# Patient Record
Sex: Female | Born: 1955
Health system: Southern US, Community
[De-identification: ages and names within clinical notes are randomized; demographics above are authoritative.]

## PROBLEM LIST (undated history)

## (undated) DIAGNOSIS — K5790 Diverticulosis of intestine, part unspecified, without perforation or abscess without bleeding: Secondary | ICD-10-CM

## (undated) DIAGNOSIS — M1990 Unspecified osteoarthritis, unspecified site: Secondary | ICD-10-CM

## (undated) DIAGNOSIS — T7840XA Allergy, unspecified, initial encounter: Secondary | ICD-10-CM

## (undated) DIAGNOSIS — Z86010 Personal history of colonic polyps: Secondary | ICD-10-CM

## (undated) DIAGNOSIS — G43909 Migraine, unspecified, not intractable, without status migrainosus: Secondary | ICD-10-CM

## (undated) HISTORY — PX: WISDOM TOOTH EXTRACTION: SHX21

## (undated) HISTORY — DX: Diverticulosis of intestine, part unspecified, without perforation or abscess without bleeding: K57.90

## (undated) HISTORY — DX: Allergy, unspecified, initial encounter: T78.40XA

## (undated) HISTORY — DX: Unspecified osteoarthritis, unspecified site: M19.90

## (undated) HISTORY — DX: Migraine, unspecified, not intractable, without status migrainosus: G43.909

---
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
## 1998-02-15 ENCOUNTER — Encounter: Payer: Self-pay | Admitting: Internal Medicine

## 2006-07-02 ENCOUNTER — Other Ambulatory Visit: Admission: RE | Admit: 2006-07-02 | Discharge: 2006-07-02 | Payer: Self-pay | Admitting: Internal Medicine

## 2006-07-02 ENCOUNTER — Encounter: Payer: Self-pay | Admitting: Internal Medicine

## 2006-07-02 ENCOUNTER — Ambulatory Visit: Payer: Self-pay | Admitting: Internal Medicine

## 2006-07-02 LAB — CONVERTED CEMR LAB
Albumin: 3.6 g/dL (ref 3.5–5.2)
Basophils Absolute: 0.1 10*3/uL (ref 0.0–0.1)
Creatinine, Ser: 0.6 mg/dL (ref 0.4–1.2)
HCT: 38.2 % (ref 36.0–46.0)
Hemoglobin: 12.9 g/dL (ref 12.0–15.0)
MCHC: 33.8 g/dL (ref 30.0–36.0)
MCV: 82.2 fL (ref 78.0–100.0)
Monocytes Absolute: 0.4 10*3/uL (ref 0.2–0.7)
Neutrophils Relative %: 52 % (ref 43.0–77.0)
Pap Smear: NORMAL
Potassium: 4.5 meq/L (ref 3.5–5.1)
RDW: 14.3 % (ref 11.5–14.6)
Sodium: 141 meq/L (ref 135–145)
TSH: 1.54 microintl units/mL (ref 0.35–5.50)
Total Bilirubin: 0.8 mg/dL (ref 0.3–1.2)
Total Protein: 7.2 g/dL (ref 6.0–8.3)

## 2006-07-16 LAB — HM MAMMOGRAPHY: HM Mammogram: NORMAL

## 2006-11-25 ENCOUNTER — Ambulatory Visit: Payer: Self-pay | Admitting: Internal Medicine

## 2006-11-25 DIAGNOSIS — L255 Unspecified contact dermatitis due to plants, except food: Secondary | ICD-10-CM

## 2007-10-18 ENCOUNTER — Telehealth: Payer: Self-pay | Admitting: Family Medicine

## 2007-10-19 ENCOUNTER — Ambulatory Visit: Payer: Self-pay | Admitting: Internal Medicine

## 2007-10-19 DIAGNOSIS — IMO0002 Reserved for concepts with insufficient information to code with codable children: Secondary | ICD-10-CM | POA: Insufficient documentation

## 2008-01-13 ENCOUNTER — Encounter: Payer: Self-pay | Admitting: Internal Medicine

## 2008-01-13 DIAGNOSIS — N943 Premenstrual tension syndrome: Secondary | ICD-10-CM | POA: Insufficient documentation

## 2008-01-14 ENCOUNTER — Ambulatory Visit: Payer: Self-pay | Admitting: Internal Medicine

## 2008-01-14 DIAGNOSIS — J309 Allergic rhinitis, unspecified: Secondary | ICD-10-CM

## 2008-01-15 ENCOUNTER — Encounter: Payer: Self-pay | Admitting: Internal Medicine

## 2008-02-03 ENCOUNTER — Encounter: Payer: Self-pay | Admitting: Internal Medicine

## 2008-02-10 ENCOUNTER — Encounter (INDEPENDENT_AMBULATORY_CARE_PROVIDER_SITE_OTHER): Payer: Self-pay | Admitting: *Deleted

## 2008-05-06 HISTORY — PX: COLONOSCOPY: SHX174

## 2008-06-18 ENCOUNTER — Telehealth: Payer: Self-pay | Admitting: Family Medicine

## 2008-06-20 ENCOUNTER — Ambulatory Visit: Payer: Self-pay | Admitting: Family Medicine

## 2008-06-20 DIAGNOSIS — N390 Urinary tract infection, site not specified: Secondary | ICD-10-CM

## 2008-06-20 LAB — CONVERTED CEMR LAB
Ketones, urine, test strip: NEGATIVE
Nitrite: NEGATIVE
pH: 5

## 2008-06-23 ENCOUNTER — Ambulatory Visit: Payer: Self-pay | Admitting: Internal Medicine

## 2008-06-23 ENCOUNTER — Encounter: Payer: Self-pay | Admitting: Internal Medicine

## 2008-06-23 DIAGNOSIS — N939 Abnormal uterine and vaginal bleeding, unspecified: Secondary | ICD-10-CM

## 2008-06-23 DIAGNOSIS — N926 Irregular menstruation, unspecified: Secondary | ICD-10-CM | POA: Insufficient documentation

## 2008-06-23 DIAGNOSIS — L989 Disorder of the skin and subcutaneous tissue, unspecified: Secondary | ICD-10-CM | POA: Insufficient documentation

## 2008-06-23 LAB — CONVERTED CEMR LAB
Bilirubin Urine: NEGATIVE
Nitrite: NEGATIVE
Specific Gravity, Urine: <1.005
pH: 7

## 2008-07-01 ENCOUNTER — Ambulatory Visit: Payer: Self-pay | Admitting: Internal Medicine

## 2008-07-15 ENCOUNTER — Ambulatory Visit: Payer: Self-pay | Admitting: Internal Medicine

## 2010-07-16 ENCOUNTER — Ambulatory Visit (INDEPENDENT_AMBULATORY_CARE_PROVIDER_SITE_OTHER): Payer: 59 | Admitting: Internal Medicine

## 2010-07-16 ENCOUNTER — Encounter: Payer: Self-pay | Admitting: Internal Medicine

## 2010-07-16 DIAGNOSIS — N951 Menopausal and female climacteric states: Secondary | ICD-10-CM | POA: Insufficient documentation

## 2010-07-24 NOTE — Assessment & Plan Note (Signed)
Summary: discuss hormone replacement   Vital Signs:  Patient profile:   55 year old female Weight:      219 pounds BMI:     38.32 Temp:     98.4 degrees F oral Pulse rate:   68 / minute Pulse rhythm:   regular BP sitting:   108 / 78  (left arm) Cuff size:   large  Vitals Entered By: Mervin Hack CMA Duncan Dull) (July 16, 2010 4:14 PM) CC: discuss hormone replacement   History of Present Illness: having terrible hot flashes goes back at least 4 years---that long since last period or more can't sleep well SOme mood issues as well doesn't feel depressed but is tired a  lot  No FH of breast cancer or vascular disease Mom still has hormonal symptoms into her 70's  Hasn't tried soy or black cohosh  Allergies: 1)  Keflex (Cephalexin)  Past History:  Past medical, surgical, family and social histories (including risk factors) reviewed for relevance to current acute and chronic problems.  Past Medical History: Reviewed history from 01/14/2008 and no changes required. Migraines--with menses Allergic rhinitis  Past Surgical History: Reviewed history from 01/13/2008 and no changes required. vaginal delivery X 1, C-section X 1  Family History: Reviewed history from 01/13/2008 and no changes required. Father: Died at age 42, MI Mother:Alive, arthritis  1 sister No HTN or DM No breast or colon cancer  Social History: Reviewed history from 01/13/2008 and no changes required. Marital Status: Married Children: 2 Occupation: Quarry manager, [employer] Never Smoked Alcohol use-occ  Review of Systems       Occ headaches---not clearly migrainous weight is up 16# in the last 2 years  Physical Exam  General:  alert and normal appearance.   Psych:  normally interactive, good eye contact, not anxious appearing, and not depressed appearing.     Impression & Recommendations:  Problem # 1:  MENOPAUSAL SYNDROME (ICD-627.2) Assessment New  having life altering  symptoms discussed the risks and benefits of hormonal Rx Counselled over half of 15 minute visit  will start estradiol PE in 3-4 months and will wean then discussed side effects to watch discussed exercise  Her updated medication list for this problem includes:    Estradiol 1 Mg Tabs (Estradiol) .Marland Kitchen... 1 tab daily for menopausal syndrome  Complete Medication List: 1)  Estradiol 1 Mg Tabs (Estradiol) .Marland Kitchen.. 1 tab daily for menopausal syndrome  Patient Instructions: 1)  Please schedule a follow-up appointment in 3-4  months for physical Prescriptions: ESTRADIOL 1 MG TABS (ESTRADIOL) 1 tab daily for menopausal syndrome  #30 x 5   Entered and Authorized by:   Cindee Salt MD   Signed by:   Cindee Salt MD on 07/16/2010   Method used:   Electronically to        [facility] [address] (retail)       [address], [address]       [place], [place]  [address]       Ph: [phone]       Fax: [phone]   RxID:   [id number]    Orders Added: 1)  Est. Patient Level III [84132]    Prior Medications: Current Allergies (reviewed today): KEFLEX (CEPHALEXIN)

## 2010-10-31 ENCOUNTER — Encounter: Payer: Self-pay | Admitting: Internal Medicine

## 2010-11-01 ENCOUNTER — Ambulatory Visit (INDEPENDENT_AMBULATORY_CARE_PROVIDER_SITE_OTHER): Payer: 59 | Admitting: Internal Medicine

## 2010-11-01 ENCOUNTER — Encounter: Payer: Self-pay | Admitting: Internal Medicine

## 2010-11-01 VITALS — BP 118/68 | HR 76 | Temp 98.4°F | Ht 63.0 in | Wt 209.0 lb

## 2010-11-01 DIAGNOSIS — Z0000 Encounter for general adult medical examination without abnormal findings: Secondary | ICD-10-CM

## 2010-11-01 DIAGNOSIS — N951 Menopausal and female climacteric states: Secondary | ICD-10-CM

## 2010-11-01 DIAGNOSIS — Z1231 Encounter for screening mammogram for malignant neoplasm of breast: Secondary | ICD-10-CM

## 2010-11-01 NOTE — Progress Notes (Signed)
Subjective:    Patient ID: Mercedes White, female    DOB: 04/09/1956, 55 y.o.   MRN: [id number]  HPI Happy with the estrogen Night sweats and hot flashes are gone Forgets dose occasionally No migraines  Doesn't see gynecologist Last pap a couple of years ago when she had abnormal bleeding  Has started on-line weight watchers Has lost 7# Mows lawn as only exercise--discussed she needs to do more  Current Outpatient Prescriptions on File Prior to Visit  Medication Sig Dispense Refill  . estradiol (ESTRACE) 1 MG tablet Take 1 mg by mouth daily.          Allergies  Allergen Reactions  . Cephalexin     REACTION: rash    Past Medical History  Diagnosis Date  . Migraines     with menses  . Allergy     Past Surgical History  Procedure Date  . Cesarean section     x1  . Vaginal delivery     x1    Family History  Problem Relation Age of Onset  . Arthritis Mother   . Cancer Neg Hx   . Diabetes Neg Hx   . Hypertension Neg Hx     History   Social History  . Marital Status: Married    Spouse Name: N/A    Number of Children: 2  . Years of Education: N/A   Occupational History  . patient billing [employer]   Social History Main Topics  . Smoking status: Never Smoker   . Smokeless tobacco: Never Used  . Alcohol Use: Yes  . Drug Use: No  . Sexually Active: Not on file   Other Topics Concern  . Not on file   Social History Narrative  . No narrative on file   Review of Systems  Constitutional: Negative for fatigue and unexpected weight change.       Wears seat belt  HENT: Positive for hearing loss, congestion and rhinorrhea. Negative for dental problem and tinnitus.        Occ seasonal symptoms---uses OTC meds Regular with dentist  Eyes: Negative for visual disturbance.       No diplopia or focal vision loss  Respiratory: Negative for cough, chest tightness and shortness of breath.   Cardiovascular: Positive for palpitations. Negative for chest pain  and leg swelling.       Still occ palpitations--only at rest EKG benign in past with this symptom  Gastrointestinal: Negative for nausea, vomiting, abdominal pain, constipation and blood in stool.       No heartburn Stool is funny black/green--she relates to all her fruit intake  Genitourinary: Negative for dysuria, difficulty urinating and dyspareunia.       Occ urgency but no incontinence  Musculoskeletal: Positive for arthralgias. Negative for back pain and joint swelling.       Left 3rd finger and right 5th fingers---from past injuries  Skin: Negative for rash.       Right calf red lesion is occ tender--no change  Neurological: Positive for numbness. Negative for dizziness, syncope, weakness and headaches.       Occ arm numbness if hold heavy purse or sitting at computer. Moves and it resolves  Psychiatric/Behavioral: Positive for sleep disturbance. Negative for dysphoric mood. The patient is not nervous/anxious.        Occ sleep problems--not restful sleep but not regularly       Objective:   Physical Exam  Constitutional: She is oriented to person, place, and time.  She appears well-developed and well-nourished. No distress.  HENT:  Head: Normocephalic and atraumatic.  Right Ear: External ear normal.  Left Ear: External ear normal.  Mouth/Throat: Oropharynx is clear and moist. No oropharyngeal exudate.       TMs normal  Eyes: Conjunctivae and EOM are normal. Pupils are equal, round, and reactive to light.       Fundi benign  Neck: Normal range of motion. Neck supple. No thyromegaly present.  Cardiovascular: Normal rate, regular rhythm, normal heart sounds and intact distal pulses.  Exam reveals no gallop.   No murmur heard. Pulmonary/Chest: Effort normal and breath sounds normal. No respiratory distress. She has no wheezes. She has no rales.  Abdominal: Soft. She exhibits no mass. There is no tenderness.  Genitourinary:       Breasts have cystic changes bilaterally without  sig mass Genitalia normal Cervix clearly estrogenized and normal Pap done Limited but benign bimanual exam  Musculoskeletal: Normal range of motion. She exhibits no edema and no tenderness.  Lymphadenopathy:    She has no cervical adenopathy.  Neurological: She is alert and oriented to person, place, and time. She exhibits normal muscle tone.       Normal strength and gait  Skin: No rash noted.       ?dermatofibroma on right calf  Psychiatric: She has a normal mood and affect. Her behavior is normal. Judgment and thought content normal.          Assessment & Plan:

## 2010-11-01 NOTE — Assessment & Plan Note (Signed)
Healthy On weight watchers and has lost some weight Discussed exercise Due for mammo

## 2010-11-01 NOTE — Progress Notes (Signed)
Addended by: Alvina Chou on: 11/01/2010 03:29 PM   Modules accepted: Orders

## 2010-11-01 NOTE — Assessment & Plan Note (Signed)
Better on the estrogen Will try to wean Check labs

## 2010-11-01 NOTE — Patient Instructions (Addendum)
Please set up mammogram Please decrease the estradiol to 5 days per week. In one month, if your symptoms are still controlled, try decreasing it to every other day

## 2010-11-08 ENCOUNTER — Ambulatory Visit: Payer: Self-pay | Admitting: Internal Medicine

## 2010-11-12 ENCOUNTER — Telehealth: Payer: Self-pay | Admitting: *Deleted

## 2010-11-12 NOTE — Telephone Encounter (Signed)
Calling patient to advise about pap results. Per Dr.Letvak, her pap didn't have enough cervix cells for a proper eval but nothing worrisome seen.  Left message for patient to return my call.

## 2010-11-13 ENCOUNTER — Encounter: Payer: Self-pay | Admitting: Internal Medicine

## 2010-11-13 NOTE — Telephone Encounter (Signed)
Spoke with patient and advised results   

## 2010-11-16 ENCOUNTER — Telehealth: Payer: Self-pay | Admitting: *Deleted

## 2010-11-16 DIAGNOSIS — R928 Other abnormal and inconclusive findings on diagnostic imaging of breast: Secondary | ICD-10-CM

## 2010-11-16 NOTE — Telephone Encounter (Signed)
Patient called back and her additional imaging has not been scheduled.

## 2010-11-16 NOTE — Telephone Encounter (Signed)
Left message on cell and home numbers asking patient to return my call. Calling patient to see if her additional studies for her mammogram have been scheduled? They need additional work-up of the left breast. Per Dr.Letvak it's noting overly worrisome just a suspicious area needs to be re-examined.

## 2010-11-20 ENCOUNTER — Encounter: Payer: Self-pay | Admitting: Internal Medicine

## 2010-11-20 NOTE — Telephone Encounter (Signed)
Addl views appt made with patient on 11/26/2010 at [facility], pt notified.

## 2010-11-26 ENCOUNTER — Telehealth: Payer: Self-pay | Admitting: *Deleted

## 2010-11-26 ENCOUNTER — Ambulatory Visit: Payer: Self-pay | Admitting: Internal Medicine

## 2010-11-26 NOTE — Telephone Encounter (Signed)
Called patient and left message to have her return my call about her mammogram results. See scanned resport, sent to be scanned on 11/26/10   Per Dr.Letvak:  the repeat mammo and ultrasound show just a cyst. This is very reassuring, they would like to recheck in to be sure it is stable.

## 2010-11-27 NOTE — Telephone Encounter (Signed)
.  left message at work and cell to have patient return my call.

## 2010-11-28 ENCOUNTER — Encounter: Payer: Self-pay | Admitting: Internal Medicine

## 2010-11-29 NOTE — Telephone Encounter (Signed)
Spoke with patient and advised results, per patient she already knew the results and know that she needs a repeat in around December.

## 2011-01-01 ENCOUNTER — Encounter: Payer: Self-pay | Admitting: Internal Medicine

## 2011-01-01 ENCOUNTER — Ambulatory Visit (INDEPENDENT_AMBULATORY_CARE_PROVIDER_SITE_OTHER): Payer: 59 | Admitting: Internal Medicine

## 2011-01-01 DIAGNOSIS — M79609 Pain in unspecified limb: Secondary | ICD-10-CM

## 2011-01-01 DIAGNOSIS — M79602 Pain in left arm: Secondary | ICD-10-CM

## 2011-01-01 NOTE — Progress Notes (Signed)
  Subjective:    Patient ID: Mercedes White, female    DOB: 1955/06/06, 55 y.o.   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 estradiol (ESTRACE) 1 MG tablet Take 1 mg by mouth daily.          Allergies  Allergen Reactions  . Cephalexin     REACTION: rash    Past Medical History  Diagnosis Date  . Migraines     with menses  . Allergy     Past Surgical History  Procedure Date  . Cesarean section     x1  . Vaginal delivery     x1    Family History  Problem Relation Age of Onset  . Arthritis Mother   . Cancer Neg Hx   . Diabetes Neg Hx   . Hypertension Neg Hx     History   Social History  . Marital Status: Married    Spouse Name: N/A    Number of Children: 2  . Years of Education: N/A   Occupational History  . patient billing [employer]   Social History Main Topics  . Smoking status: Never Smoker   . Smokeless tobacco: Never Used  . Alcohol Use: Yes  . Drug Use: No  . Sexually Active: Not on file   Other Topics Concern  . Not on file   Social History Narrative  . No narrative on file   Review of Systems Grip strength is okay No leg problems except some puffiness in right knee since fall     Objective:   Physical Exam  Constitutional: She appears well-developed and well-nourished. No distress.  Musculoskeletal:       Normal ROM in left shoulder--very slight crepitus Slight tenderness along axilla and pectoral muscle at shoulder  Lymphadenopathy:    She has no axillary adenopathy.    Neurological:       Normal strength          Assessment & Plan:

## 2011-01-01 NOTE — Assessment & Plan Note (Signed)
Clearly seems to be related to the fall Muscle injury only No masses or joint findings of concern Reassured--discussed symptom care

## 2011-04-02 ENCOUNTER — Telehealth: Payer: Self-pay | Admitting: Internal Medicine

## 2011-04-02 DIAGNOSIS — R928 Other abnormal and inconclusive findings on diagnostic imaging of breast: Secondary | ICD-10-CM

## 2011-04-02 NOTE — Telephone Encounter (Signed)
[name],  There was a prior order for this entered in July Please find this and cancel it

## 2011-04-02 NOTE — Telephone Encounter (Signed)
Mercedes White from [facility] called to ask you to please put an order in for the 6 month FU Diagnostic Uni Left MMG , DX is nodularity. They have scheduled her an appt on 05/23/2011 at 10:00am. Please sign and fax your order to [phone]. I will call the patient to give her this  Date and time.

## 2011-06-04 ENCOUNTER — Ambulatory Visit: Payer: Self-pay | Admitting: Internal Medicine

## 2012-04-08 ENCOUNTER — Other Ambulatory Visit: Payer: Self-pay | Admitting: Internal Medicine

## 2013-04-05 ENCOUNTER — Ambulatory Visit (INDEPENDENT_AMBULATORY_CARE_PROVIDER_SITE_OTHER): Payer: BC Managed Care – PPO | Admitting: Internal Medicine

## 2013-04-05 ENCOUNTER — Encounter: Payer: Self-pay | Admitting: Internal Medicine

## 2013-04-05 VITALS — BP 110/70 | HR 58 | Temp 97.7°F | Wt 196.0 lb

## 2013-04-05 DIAGNOSIS — R05 Cough: Secondary | ICD-10-CM

## 2013-04-05 MED ORDER — PREDNISONE 20 MG PO TABS: 40.0000 mg | ORAL_TABLET | Freq: Every day | ORAL | Status: DC

## 2013-04-05 MED ORDER — AMOXICILLIN 500 MG PO TABS: 1000.0000 mg | ORAL_TABLET | Freq: Two times a day (BID) | ORAL | Status: DC

## 2013-04-05 NOTE — Progress Notes (Signed)
   Subjective:    Patient ID: Mercedes White, female    DOB: 02-19-1956, 57 y.o.   MRN: [id number]  HPI Had a bad cold in October Missed a day of work Then got another cold last month---- not the cough is persistent  Doesn't feel sick Cough worst in AM---lots of phlegm and nasal drainage Notes wheezing if she is laughing--not any worse No history of asthma  No fever Gets a feeling of shortness of breath if she takes a deep breath Still has energy---not limited in activity Has hot flashes at night--no change  Right ear has some crust in canal--no pain No sore throat  Has used mucinex and tylenol cold Nothing recently  Current Outpatient Prescriptions on File Prior to Visit  Medication Sig Dispense Refill  . b complex vitamins capsule Take 1 capsule by mouth daily.        . Multiple Vitamin (MULTIVITAMIN) tablet Take 1 tablet by mouth daily.         No current facility-administered medications on file prior to visit.    Allergies  Allergen Reactions  . Cephalexin     REACTION: rash    Past Medical History  Diagnosis Date  . Migraines     with menses  . Allergy     Past Surgical History  Procedure Laterality Date  . Cesarean section      x1  . Vaginal delivery      x1    Family History  Problem Relation Age of Onset  . Arthritis Mother   . Cancer Neg Hx   . Diabetes Neg Hx   . Hypertension Neg Hx     History   Social History  . Marital Status: Married    Spouse Name: N/A    Number of Children: 2  . Years of Education: N/A   Occupational History  . patient billing [employer]   Social History Main Topics  . Smoking status: Never Smoker   . Smokeless tobacco: Never Used  . Alcohol Use: Yes  . Drug Use: No  . Sexual Activity: Not on file   Other Topics Concern  . Not on file   Social History Narrative  . No narrative on file   Review of Systems No rash Appetite is fine No vomiting or diarrhea     Objective:   Physical Exam    Constitutional: She appears well-developed and well-nourished. No distress.  HENT:  Mouth/Throat: Oropharynx is clear and moist. No oropharyngeal exudate.  No sinus tenderness Moderate nasal congestion TMs normal  Neck: Normal range of motion. Neck supple.  Pulmonary/Chest: Effort normal and breath sounds normal. No respiratory distress. She has no wheezes. She has no rales.  Lymphadenopathy:    She has no cervical adenopathy.          Assessment & Plan:

## 2013-04-05 NOTE — Assessment & Plan Note (Signed)
Persistent upper respiratory infection Spirometry was normal---though could still have cough variant asthma (and she wheezes at times) Will Rx with amoxicillin and 5 days of prednisone

## 2013-04-05 NOTE — Progress Notes (Signed)
Pre-visit discussion using our clinic review tool. No additional management support is needed unless otherwise documented below in the visit note.  

## 2013-04-15 ENCOUNTER — Telehealth: Payer: Self-pay | Admitting: Internal Medicine

## 2013-04-15 NOTE — Telephone Encounter (Signed)
Pt left voicemail with triage. She was seen 04/05/13 for cough and was prescribed abx and prednisone. Pt has finished both medications and she is still coughing bad, especially at night. Pt wanted to know is there something OTC she can take or if there is something else you can recommend her to do for the cough

## 2013-04-15 NOTE — Telephone Encounter (Signed)
Find out if the prednisone helped the cough when she took it She can use dextromethorphan OTC for cough otherwise reeval if SOB

## 2013-04-15 NOTE — Telephone Encounter (Signed)
Spoke with patient and advised results   

## 2013-04-19 NOTE — Telephone Encounter (Signed)
Pts cough is better with Delsym but still taking coughing spells. No fever,non prod cough, or CP. No SOB noted but thinks if exercised pt would have SOB.[facility] [address]. Pt will call back if condition changes or worsens. Pt scheduled 1st available with Dr Alphonsus Sias on 04/21/13 at 11:15 am.

## 2013-04-19 NOTE — Telephone Encounter (Signed)
If she thinks she is improving, even slowly, I would just recommend continuing the delsym. If she worsens, she should come back for a reevaluation  It is not unusual to have a residual cough for several weeks after a bad chest infection---it will just slowly fade away.

## 2013-04-19 NOTE — Telephone Encounter (Signed)
.  left message to have patient return my call.  

## 2013-04-20 NOTE — Telephone Encounter (Signed)
Spoke with patient and she will wait it out and call back if the coughs get worse

## 2013-04-20 NOTE — Telephone Encounter (Signed)
.  left message to have patient return my call.  

## 2013-04-21 ENCOUNTER — Ambulatory Visit: Payer: BC Managed Care – PPO | Admitting: Internal Medicine

## 2013-11-01 ENCOUNTER — Ambulatory Visit (INDEPENDENT_AMBULATORY_CARE_PROVIDER_SITE_OTHER): Payer: BC Managed Care – PPO | Admitting: Internal Medicine

## 2013-11-01 ENCOUNTER — Encounter: Payer: Self-pay | Admitting: Internal Medicine

## 2013-11-01 VITALS — BP 118/78 | HR 70 | Temp 98.3°F | Ht 63.0 in | Wt 202.0 lb

## 2013-11-01 DIAGNOSIS — Z23 Encounter for immunization: Secondary | ICD-10-CM

## 2013-11-01 DIAGNOSIS — E669 Obesity, unspecified: Secondary | ICD-10-CM

## 2013-11-01 DIAGNOSIS — Z0000 Encounter for general adult medical examination without abnormal findings: Secondary | ICD-10-CM

## 2013-11-01 NOTE — Progress Notes (Signed)
Pre visit review using our clinic review tool, if applicable. No additional management support is needed unless otherwise documented below in the visit note. 

## 2013-11-01 NOTE — Assessment & Plan Note (Signed)
Lifestyle info given Will check labs

## 2013-11-01 NOTE — Patient Instructions (Addendum)
Please set up your screening mammogram.  Exercise to Lose Weight Exercise and a healthy diet may help you lose weight. Your doctor may suggest specific exercises. EXERCISE IDEAS AND TIPS  Choose low-cost things you enjoy doing, such as walking, bicycling, or exercising to workout videos.  Take stairs instead of the elevator.  Walk during your lunch break.  Park your car further away from work or school.  Go to a gym or an exercise class.  Start with 5 to 10 minutes of exercise each day. Build up to 30 minutes of exercise 4 to 6 days a week.  Wear shoes with good support and comfortable clothes.  Stretch before and after working out.  Work out until you breathe harder and your heart beats faster.  Drink extra water when you exercise.  Do not do so much that you hurt yourself, feel dizzy, or get very short of breath. Exercises that burn about 150 calories:  Running 1  miles in 15 minutes.  Playing volleyball for 45 to 60 minutes.  Washing and waxing a car for 45 to 60 minutes.  Playing touch football for 45 minutes.  Walking 1  miles in 35 minutes.  Pushing a stroller 1  miles in 30 minutes.  Playing basketball for 30 minutes.  Raking leaves for 30 minutes.  Bicycling 5 miles in 30 minutes.  Walking 2 miles in 30 minutes.  Dancing for 30 minutes.  Shoveling snow for 15 minutes.  Swimming laps for 20 minutes.  Walking up stairs for 15 minutes.  Bicycling 4 miles in 15 minutes.  Gardening for 30 to 45 minutes.  Jumping rope for 15 minutes.  Washing windows or floors for 45 to 60 minutes. Document Released: 05/25/2010 Document Revised: 07/15/2011 Document Reviewed: 05/25/2010 [facility] Patient Information 2015 [place], [place]. This information is not intended to replace advice given to you by your health care provider. Make sure you discuss any questions you have with your health care provider. DASH Eating Plan DASH stands for "Dietary Approaches to  Stop Hypertension." The DASH eating plan is a healthy eating plan that has been shown to reduce high blood pressure (hypertension). Additional health benefits may include reducing the risk of type 2 diabetes mellitus, heart disease, and stroke. The DASH eating plan may also help with weight loss. WHAT DO I NEED TO KNOW ABOUT THE DASH EATING PLAN? For the DASH eating plan, you will follow these general guidelines:  Choose foods with a percent daily value for sodium of less than 5% (as listed on the food label).  Use salt-free seasonings or herbs instead of table salt or sea salt.  Check with your health care provider or pharmacist before using salt substitutes.  Eat lower-sodium products, often labeled as "lower sodium" or "no salt added."  Eat fresh foods.  Eat more vegetables, fruits, and low-fat dairy products.  Choose whole grains. Look for the word "whole" as the first word in the ingredient list.  Choose fish and skinless chicken or [place] more often than red meat. Limit fish, poultry, and meat to 6 oz (170 g) each day.  Limit sweets, desserts, sugars, and sugary drinks.  Choose heart-healthy fats.  Limit cheese to 1 oz (28 g) per day.  Eat more home-cooked food and less restaurant, buffet, and fast food.  Limit fried foods.  Cook foods using methods other than frying.  Limit canned vegetables. If you do use them, rinse them well to decrease the sodium.  When eating at a restaurant,  ask that your food be prepared with less salt, or no salt if possible. WHAT FOODS CAN I EAT? Seek help from a dietitian for individual calorie needs. Grains Whole grain or whole wheat bread. Brown rice. Whole grain or whole wheat pasta. Quinoa, bulgur, and whole grain cereals. Low-sodium cereals. Corn or whole wheat flour tortillas. Whole grain cornbread. Whole grain crackers. Low-sodium crackers. Vegetables Fresh or frozen vegetables (raw, steamed, roasted, or grilled). Low-sodium or  reduced-sodium tomato and vegetable juices. Low-sodium or reduced-sodium tomato sauce and paste. Low-sodium or reduced-sodium canned vegetables.  Fruits All fresh, canned (in natural juice), or frozen fruits. Meat and Other Protein Products Ground beef (85% or leaner), grass-fed beef, or beef trimmed of fat. Skinless chicken or [place]. Ground chicken or [place]. Pork trimmed of fat. All fish and seafood. Eggs. Dried beans, peas, or lentils. Unsalted nuts and seeds. Unsalted canned beans. Dairy Low-fat dairy products, such as skim or 1% milk, 2% or reduced-fat cheeses, low-fat ricotta or cottage cheese, or plain low-fat yogurt. Low-sodium or reduced-sodium cheeses. Fats and Oils Tub margarines without trans fats. Light or reduced-fat mayonnaise and salad dressings (reduced sodium). Avocado. Safflower, olive, or canola oils. Natural peanut or almond butter. Other Unsalted popcorn and pretzels. The items listed above may not be a complete list of recommended foods or beverages. Contact your dietitian for more options. WHAT FOODS ARE NOT RECOMMENDED? Grains White bread. White pasta. White rice. Refined cornbread. Bagels and croissants. Crackers that contain trans fat. Vegetables Creamed or fried vegetables. Vegetables in a cheese sauce. Regular canned vegetables. Regular canned tomato sauce and paste. Regular tomato and vegetable juices. Fruits Dried fruits. Canned fruit in light or heavy syrup. Fruit juice. Meat and Other Protein Products Fatty cuts of meat. Ribs, chicken wings, bacon, sausage, bologna, salami, chitterlings, fatback, hot dogs, bratwurst, and packaged luncheon meats. Salted nuts and seeds. Canned beans with salt. Dairy Whole or 2% milk, cream, half-and-half, and cream cheese. Whole-fat or sweetened yogurt. Full-fat cheeses or blue cheese. Nondairy creamers and whipped toppings. Processed cheese, cheese spreads, or cheese curds. Condiments Onion and garlic salt, seasoned salt,  table salt, and sea salt. Canned and packaged gravies. Worcestershire sauce. Tartar sauce. Barbecue sauce. Teriyaki sauce. Soy sauce, including reduced sodium. Steak sauce. Fish sauce. Oyster sauce. Cocktail sauce. Horseradish. Ketchup and mustard. Meat flavorings and tenderizers. Bouillon cubes. Hot sauce. Tabasco sauce. Marinades. Taco seasonings. Relishes. Fats and Oils Butter, stick margarine, lard, shortening, ghee, and bacon fat. Coconut, palm kernel, or palm oils. Regular salad dressings. Other Pickles and olives. Salted popcorn and pretzels. The items listed above may not be a complete list of foods and beverages to avoid. Contact your dietitian for more information. WHERE CAN I FIND MORE INFORMATION? National Heart, Lung, and Blood Institute: travelstabloid.com Document Released: 04/11/2011 Document Revised: 04/27/2013 Document Reviewed: 02/24/2013 [facility] Patient Information 2015 [place], [place]. This information is not intended to replace advice given to you by your health care provider. Make sure you discuss any questions you have with your health care provider.

## 2013-11-01 NOTE — Assessment & Plan Note (Signed)
Healthy but out of shape Information given  Tdap today She will set up mammogram Pap done

## 2013-11-01 NOTE — Progress Notes (Signed)
Subjective:    Patient ID: Mercedes White, female    DOB: 24-Apr-1956, 58 y.o.   MRN: [id number]  HPI Here for physical Still concerned about her weight--had lost some weight and then gained it back Discussed healthy behaviors Some hot flashes but doing okay without the hormones  Mild hand arthritis Right 3rd finger has some triggering--not ready for shot  No other concerns  Current Outpatient Prescriptions on File Prior to Visit  Medication Sig Dispense Refill  . b complex vitamins capsule Take 1 capsule by mouth daily.        . Multiple Vitamin (MULTIVITAMIN) tablet Take 1 tablet by mouth daily.         No current facility-administered medications on file prior to visit.    Allergies  Allergen Reactions  . Cephalexin     REACTION: rash    Past Medical History  Diagnosis Date  . Migraines     with menses  . Allergy     Past Surgical History  Procedure Laterality Date  . Cesarean section      x1  . Vaginal delivery      x1    Family History  Problem Relation Age of Onset  . Arthritis Mother   . Cancer Neg Hx   . Diabetes Neg Hx   . Hypertension Neg Hx     History   Social History  . Marital Status: Married    Spouse Name: N/A    Number of Children: 2  . Years of Education: N/A   Occupational History  . 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 Smoking status: Never Smoker   . Smokeless tobacco: Never Used  . Alcohol Use: Yes  . Drug Use: No  . Sexual Activity: Not on file   Other Topics Concern  . Not on file   Social History Narrative  . No narrative on file   Review of Systems  Constitutional: Negative for fatigue and unexpected weight change.       Wears seat belt  HENT: Negative for dental problem, hearing loss and tinnitus.        Regular with dentist  Eyes: Negative for visual disturbance.       No diplopia or unilateral vision loss  Respiratory: Negative for cough, chest tightness and shortness of breath.     Cardiovascular: Positive for palpitations. Negative for chest pain and leg swelling.       Occ palps-- often just at rest  Gastrointestinal: Positive for constipation. Negative for nausea, vomiting, abdominal pain and blood in stool.       Doesn't go daily but no problems. Tries fiber at times.  Endocrine: Negative for cold intolerance and heat intolerance.  Genitourinary: Negative for dysuria, hematuria, difficulty urinating and dyspareunia.       Uses lubricant for sex  Musculoskeletal: Positive for arthralgias. Negative for back pain and joint swelling.       Hand pain only  Skin: Negative for rash.       No suspicious lesions  Allergic/Immunologic: Positive for environmental allergies. Negative for immunocompromised state.       Uses OTC meds prn  Neurological: Positive for headaches. Negative for dizziness, syncope, weakness, light-headedness and numbness.       Rare migraines--- excedrin migraine resolves  Hematological: Negative for adenopathy. Bruises/bleeds easily.  Psychiatric/Behavioral: Positive for sleep disturbance. Negative for dysphoric mood. The patient is not nervous/anxious.        Chronic sleep  problems--episodic (dog interrupts her) No daytime somnolence       Objective:   Physical Exam  Constitutional: She is oriented to person, place, and time. She appears well-developed and well-nourished. No distress.  HENT:  Head: Normocephalic and atraumatic.  Right Ear: External ear normal.  Left Ear: External ear normal.  Mouth/Throat: Oropharynx is clear and moist. No oropharyngeal exudate.  Eyes: Conjunctivae and EOM are normal. Pupils are equal, round, and reactive to light.  Neck: Normal range of motion. Neck supple. No thyromegaly present.  Cardiovascular: Normal rate, regular rhythm, normal heart sounds and intact distal pulses.  Exam reveals no gallop.   No murmur heard. Pulmonary/Chest: Effort normal and breath sounds normal. No respiratory distress. She has no  wheezes. She has no rales.  Abdominal: Soft. There is no tenderness.  Genitourinary:  Slight cystic tissue in periareolar area on right. No worrisome findings  Introitus normal. Cervix looks normal Pap done Limited bimanual is negative  Musculoskeletal: She exhibits no edema and no tenderness.  Lymphadenopathy:    She has no cervical adenopathy.  Neurological: She is alert and oriented to person, place, and time.  Skin: No rash noted. No erythema.  Psychiatric: She has a normal mood and affect. Her behavior is normal.          Assessment & Plan:

## 2013-11-01 NOTE — Addendum Note (Signed)
Addended by: Despina Hidden on: 11/01/2013 04:23 PM   Modules accepted: Orders

## 2013-11-03 LAB — CBC WITH DIFFERENTIAL/PLATELET
BASOS ABS: 0 10*3/uL (ref 0.0–0.2)
Basos: 0 %
Eos: 3 %
Eosinophils Absolute: 0.2 10*3/uL (ref 0.0–0.4)
HCT: 40.3 % (ref 34.0–46.6)
HEMOGLOBIN: 13.2 g/dL (ref 11.1–15.9)
Immature Grans (Abs): 0 10*3/uL (ref 0.0–0.1)
Immature Granulocytes: 0 %
LYMPHS: 48 %
Lymphocytes Absolute: 3.8 10*3/uL — ABNORMAL HIGH (ref 0.7–3.1)
MCH: 29.3 pg (ref 26.6–33.0)
MCHC: 32.8 g/dL (ref 31.5–35.7)
MCV: 90 fL (ref 79–97)
MONOCYTES: 5 %
Monocytes Absolute: 0.4 10*3/uL (ref 0.1–0.9)
NEUTROS ABS: 3.5 10*3/uL (ref 1.4–7.0)
NEUTROS PCT: 44 %
RBC: 4.5 x10E6/uL (ref 3.77–5.28)
RDW: 14 % (ref 12.3–15.4)
WBC: 8 10*3/uL (ref 3.4–10.8)

## 2013-11-03 LAB — COMPREHENSIVE METABOLIC PANEL
ALK PHOS: 81 IU/L (ref 39–117)
ALT: 20 IU/L (ref 0–32)
AST: 17 IU/L (ref 0–40)
Albumin/Globulin Ratio: 1.8 (ref 1.1–2.5)
Albumin: 4.5 g/dL (ref 3.5–5.5)
BUN / CREAT RATIO: 22 (ref 9–23)
BUN: 15 mg/dL (ref 6–24)
CO2: 25 mmol/L (ref 18–29)
CREATININE: 0.69 mg/dL (ref 0.57–1.00)
Calcium: 9.6 mg/dL (ref 8.7–10.2)
Chloride: 100 mmol/L (ref 97–108)
GFR calc Af Amer: 113 mL/min/{1.73_m2} (ref >59)
GFR calc non Af Amer: 98 mL/min/{1.73_m2} (ref >59)
GLUCOSE: 87 mg/dL (ref 65–99)
Globulin, Total: 2.5 g/dL (ref 1.5–4.5)
POTASSIUM: 4.3 mmol/L (ref 3.5–5.2)
SODIUM: 141 mmol/L (ref 134–144)
Total Bilirubin: 0.5 mg/dL (ref 0.0–1.2)
Total Protein: 7 g/dL (ref 6.0–8.5)

## 2013-11-03 LAB — LIPID PANEL
CHOLESTEROL TOTAL: 215 mg/dL — AB (ref 100–199)
Chol/HDL Ratio: 2.8 ratio units (ref 0.0–4.4)
HDL: 78 mg/dL (ref >39)
LDL Calculated: 115 mg/dL — ABNORMAL HIGH (ref 0–99)
Triglycerides: 110 mg/dL (ref 0–149)
VLDL CHOLESTEROL CAL: 22 mg/dL (ref 5–40)

## 2013-11-03 LAB — TSH: TSH: 3.02 u[IU]/mL (ref 0.450–4.500)

## 2013-11-03 LAB — T4, FREE: FREE T4: 1.09 ng/dL (ref 0.82–1.77)

## 2013-11-04 LAB — PAP LB (LIQUID-BASED): PAP Smear Comment: 0

## 2013-12-15 ENCOUNTER — Ambulatory Visit (INDEPENDENT_AMBULATORY_CARE_PROVIDER_SITE_OTHER): Payer: BC Managed Care – PPO | Admitting: Internal Medicine

## 2013-12-15 ENCOUNTER — Encounter: Payer: Self-pay | Admitting: Internal Medicine

## 2013-12-15 VITALS — BP 110/70 | HR 101 | Temp 97.7°F | Wt 195.0 lb

## 2013-12-15 DIAGNOSIS — N390 Urinary tract infection, site not specified: Secondary | ICD-10-CM | POA: Insufficient documentation

## 2013-12-15 DIAGNOSIS — R319 Hematuria, unspecified: Secondary | ICD-10-CM

## 2013-12-15 DIAGNOSIS — N3000 Acute cystitis without hematuria: Secondary | ICD-10-CM

## 2013-12-15 DIAGNOSIS — N3001 Acute cystitis with hematuria: Secondary | ICD-10-CM

## 2013-12-15 LAB — POCT URINALYSIS DIPSTICK
Bilirubin, UA: NEGATIVE
GLUCOSE UA: NEGATIVE
Ketones, UA: NEGATIVE
Nitrite, UA: POSITIVE
Spec Grav, UA: 1.015
UROBILINOGEN UA: NEGATIVE
pH, UA: 6

## 2013-12-15 MED ORDER — SULFAMETHOXAZOLE-TMP DS 800-160 MG PO TABS: 1.0000 | ORAL_TABLET | Freq: Two times a day (BID) | ORAL | Status: DC

## 2013-12-15 NOTE — Assessment & Plan Note (Addendum)
No systemic symptoms but gross hematuria Will send culture Empiric Rx with septra Discussed symptom management (she has azo to try, analgesics, has already increased fluids)

## 2013-12-15 NOTE — Addendum Note (Signed)
Addended by: Despina Hidden on: 12/15/2013 10:51 AM   Modules accepted: Orders

## 2013-12-15 NOTE — Progress Notes (Signed)
Pre visit review using our clinic review tool, if applicable. No additional management support is needed unless otherwise documented below in the visit note. 

## 2013-12-15 NOTE — Progress Notes (Signed)
   Subjective:    Patient ID: Mercedes White, female    DOB: 1956/02/16, 58 y.o.   MRN: [id number]  HPI Noticed that her urine was red this morning Feels pressure on bladder--feels better standing Lots of urgency Last UTI many years ago Now with terminal dysuria  No fever No back pain No history of kidney stones  Does use feminine deodorant spray outside--nothing new  Current Outpatient Prescriptions on File Prior to Visit  Medication Sig Dispense Refill  . Multiple Vitamin (MULTIVITAMIN) tablet Take 1 tablet by mouth daily.         No current facility-administered medications on file prior to visit.    Allergies  Allergen Reactions  . Cephalexin     REACTION: rash    Past Medical History  Diagnosis Date  . Migraines     with menses  . Allergy     Past Surgical History  Procedure Laterality Date  . Cesarean section      x1  . Vaginal delivery      x1    Family History  Problem Relation Age of Onset  . Arthritis Mother   . Cancer Neg Hx   . Diabetes Neg Hx   . Hypertension Neg Hx     History   Social History  . Marital Status: Married    Spouse Name: N/A    Number of Children: 2  . Years of Education: N/A   Occupational History  . 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 Smoking status: Never Smoker   . Smokeless tobacco: Never Used  . Alcohol Use: Yes  . Drug Use: No  . Sexual Activity: Not on file   Other Topics Concern  . Not on file   Social History Narrative  . No narrative on file   Review of Systems No nausea or vomiting Weight watchers diet-- did eat her routine this morning    Objective:   Physical Exam  Constitutional: She appears well-developed and well-nourished. No distress.  Abdominal: Soft. She exhibits no distension. There is no tenderness. There is no rebound and no guarding.  Musculoskeletal:  No CVA tenderness          Assessment & Plan:

## 2013-12-18 LAB — URINE CULTURE: Colony Count: >=100000

## 2014-09-16 ENCOUNTER — Telehealth: Payer: Self-pay | Admitting: *Deleted

## 2014-09-16 NOTE — Telephone Encounter (Signed)
Called patient to schedule screening mammogram.  Left message for patient to return my call.

## 2014-11-04 ENCOUNTER — Ambulatory Visit (INDEPENDENT_AMBULATORY_CARE_PROVIDER_SITE_OTHER): Payer: BLUE CROSS/BLUE SHIELD | Admitting: Internal Medicine

## 2014-11-04 ENCOUNTER — Encounter: Payer: Self-pay | Admitting: Internal Medicine

## 2014-11-04 VITALS — BP 118/78 | HR 83 | Temp 98.0°F | Ht 63.0 in | Wt 194.0 lb

## 2014-11-04 DIAGNOSIS — Z0000 Encounter for general adult medical examination without abnormal findings: Secondary | ICD-10-CM | POA: Diagnosis not present

## 2014-11-04 NOTE — Progress Notes (Signed)
Pre visit review using our clinic review tool, if applicable. No additional management support is needed unless otherwise documented below in the visit note. 

## 2014-11-04 NOTE — Assessment & Plan Note (Signed)
Healthy Discussed fitness Overdue for mammogram--no exam after discussion UTD otherwise No blood work this year

## 2014-11-04 NOTE — Progress Notes (Signed)
Subjective:    Patient ID: Mercedes White, female    DOB: 1955/07/10, 59 y.o.   MRN: [id number]  HPI Here for physical Doing well  Having more trouble with arthritis in hands Left CMC will "snap"--with some pain Some pain in DIPs also Trigger finger left 3rd--not too bad lately Not using any Rx Types all day for job No major morning stiffness  Not sleeping well lately Dog has been sick Some hair loss  Current Outpatient Prescriptions on File Prior to Visit  Medication Sig Dispense Refill  . Cyanocobalamin (B-12) 2500 MCG SUBL Place 1 tablet under the tongue.    . Multiple Vitamin (MULTIVITAMIN) tablet Take 1 tablet by mouth daily.      . TURMERIC PO Take by mouth daily.    . Zinc 50 MG CAPS Take by mouth daily.     No current facility-administered medications on file prior to visit.    Allergies  Allergen Reactions  . Cephalexin     REACTION: rash    Past Medical History  Diagnosis Date  . Migraines     with menses  . Allergy     Past Surgical History  Procedure Laterality Date  . Cesarean section      x1  . Vaginal delivery      x1    Family History  Problem Relation Age of Onset  . Arthritis Mother   . Cancer Neg Hx   . Diabetes Neg Hx   . Hypertension Neg Hx     History   Social History  . Marital Status: Married    Spouse Name: N/A  . Number of Children: 2  . Years of Education: N/A   Occupational History  . 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 Smoking status: Never Smoker   . Smokeless tobacco: Never Used  . Alcohol Use: Yes  . Drug Use: No  . Sexual Activity: Not on file   Other Topics Concern  . Not on file   Social History Narrative   Review of Systems  Constitutional: Negative for fatigue and unexpected weight change.       Wears seat belt  HENT: Negative for tinnitus.        Gets sense of fluid in right ear Keeps up with dentist   Eyes: Negative for visual disturbance.       No diplopia or  unilateral vision loss  Respiratory: Positive for cough. Negative for chest tightness and shortness of breath.        Occ cough--relates to allergy  Cardiovascular: Positive for palpitations. Negative for chest pain and leg swelling.       Rare extra beat at rest  Gastrointestinal: Negative for nausea, vomiting, abdominal pain, constipation and blood in stool.       No heartburn  Endocrine: Negative for polydipsia and polyuria.  Genitourinary: Negative for dysuria and hematuria.       Vaginal dryness --uses lubricant  Musculoskeletal: Positive for arthralgias. Negative for back pain and joint swelling.  Skin: Negative for rash.       No suspicious lesions  Allergic/Immunologic: Positive for environmental allergies. Negative for immunocompromised state.       Using loratadine for this  Neurological: Negative for dizziness, syncope, weakness, light-headedness, numbness and headaches.  Hematological: Negative for adenopathy. Does not bruise/bleed easily.  Psychiatric/Behavioral: Negative for sleep disturbance and dysphoric mood. The patient is not nervous/anxious.        Objective:  Physical Exam  Constitutional: She is oriented to person, place, and time. She appears well-nourished. No distress.  HENT:  Head: Normocephalic and atraumatic.  Right Ear: External ear normal.  Left Ear: External ear normal.  Mouth/Throat: Oropharynx is clear and moist. No oropharyngeal exudate.  Eyes: Conjunctivae and EOM are normal. Pupils are equal, round, and reactive to light.  Neck: Normal range of motion. Neck supple. No thyromegaly present.  Cardiovascular: Normal rate, regular rhythm, normal heart sounds and intact distal pulses.  Exam reveals no gallop.   No murmur heard. Pulmonary/Chest: Effort normal and breath sounds normal. No respiratory distress. She has no wheezes. She has no rales.  Abdominal: Soft. There is no tenderness.  Musculoskeletal: She exhibits no edema or tenderness.    Lymphadenopathy:    She has no cervical adenopathy.  Neurological: She is alert and oriented to person, place, and time.  Skin: No rash noted. No erythema.  Psychiatric: She has a normal mood and affect. Her behavior is normal.          Assessment & Plan:

## 2014-11-04 NOTE — Patient Instructions (Signed)
Please set up your screening mammogram.  DASH Eating Plan DASH stands for "Dietary Approaches to Stop Hypertension." The DASH eating plan is a healthy eating plan that has been shown to reduce high blood pressure (hypertension). Additional health benefits may include reducing the risk of type 2 diabetes mellitus, heart disease, and stroke. The DASH eating plan may also help with weight loss. WHAT DO I NEED TO KNOW ABOUT THE DASH EATING PLAN? For the DASH eating plan, you will follow these general guidelines:  Choose foods with a percent daily value for sodium of less than 5% (as listed on the food label).  Use salt-free seasonings or herbs instead of table salt or sea salt.  Check with your health care provider or pharmacist before using salt substitutes.  Eat lower-sodium products, often labeled as "lower sodium" or "no salt added."  Eat fresh foods.  Eat more vegetables, fruits, and low-fat dairy products.  Choose whole grains. Look for the word "whole" as the first word in the ingredient list.  Choose fish and skinless chicken or [place] more often than red meat. Limit fish, poultry, and meat to 6 oz (170 g) each day.  Limit sweets, desserts, sugars, and sugary drinks.  Choose heart-healthy fats.  Limit cheese to 1 oz (28 g) per day.  Eat more home-cooked food and less restaurant, buffet, and fast food.  Limit fried foods.  Cook foods using methods other than frying.  Limit canned vegetables. If you do use them, rinse them well to decrease the sodium.  When eating at a restaurant, ask that your food be prepared with less salt, or no salt if possible. WHAT FOODS CAN I EAT? Seek help from a dietitian for individual calorie needs. Grains Whole grain or whole wheat bread. Brown rice. Whole grain or whole wheat pasta. Quinoa, bulgur, and whole grain cereals. Low-sodium cereals. Corn or whole wheat flour tortillas. Whole grain cornbread. Whole grain crackers. Low-sodium  crackers. Vegetables Fresh or frozen vegetables (raw, steamed, roasted, or grilled). Low-sodium or reduced-sodium tomato and vegetable juices. Low-sodium or reduced-sodium tomato sauce and paste. Low-sodium or reduced-sodium canned vegetables.  Fruits All fresh, canned (in natural juice), or frozen fruits. Meat and Other Protein Products Ground beef (85% or leaner), grass-fed beef, or beef trimmed of fat. Skinless chicken or [place]. Ground chicken or [place]. Pork trimmed of fat. All fish and seafood. Eggs. Dried beans, peas, or lentils. Unsalted nuts and seeds. Unsalted canned beans. Dairy Low-fat dairy products, such as skim or 1% milk, 2% or reduced-fat cheeses, low-fat ricotta or cottage cheese, or plain low-fat yogurt. Low-sodium or reduced-sodium cheeses. Fats and Oils Tub margarines without trans fats. Light or reduced-fat mayonnaise and salad dressings (reduced sodium). Avocado. Safflower, olive, or canola oils. Natural peanut or almond butter. Other Unsalted popcorn and pretzels. The items listed above may not be a complete list of recommended foods or beverages. Contact your dietitian for more options. WHAT FOODS ARE NOT RECOMMENDED? Grains White bread. White pasta. White rice. Refined cornbread. Bagels and croissants. Crackers that contain trans fat. Vegetables Creamed or fried vegetables. Vegetables in a cheese sauce. Regular canned vegetables. Regular canned tomato sauce and paste. Regular tomato and vegetable juices. Fruits Dried fruits. Canned fruit in light or heavy syrup. Fruit juice. Meat and Other Protein Products Fatty cuts of meat. Ribs, chicken wings, bacon, sausage, bologna, salami, chitterlings, fatback, hot dogs, bratwurst, and packaged luncheon meats. Salted nuts and seeds. Canned beans with salt. Dairy Whole or 2% milk, cream, half-and-half, and  cream cheese. Whole-fat or sweetened yogurt. Full-fat cheeses or blue cheese. Nondairy creamers and whipped toppings.  Processed cheese, cheese spreads, or cheese curds. Condiments Onion and garlic salt, seasoned salt, table salt, and sea salt. Canned and packaged gravies. Worcestershire sauce. Tartar sauce. Barbecue sauce. Teriyaki sauce. Soy sauce, including reduced sodium. Steak sauce. Fish sauce. Oyster sauce. Cocktail sauce. Horseradish. Ketchup and mustard. Meat flavorings and tenderizers. Bouillon cubes. Hot sauce. Tabasco sauce. Marinades. Taco seasonings. Relishes. Fats and Oils Butter, stick margarine, lard, shortening, ghee, and bacon fat. Coconut, palm kernel, or palm oils. Regular salad dressings. Other Pickles and olives. Salted popcorn and pretzels. The items listed above may not be a complete list of foods and beverages to avoid. Contact your dietitian for more information. WHERE CAN I FIND MORE INFORMATION? National Heart, Lung, and Blood Institute: travelstabloid.com Document Released: 04/11/2011 Document Revised: 09/06/2013 Document Reviewed: 02/24/2013 [facility] Patient Information 2015 [place], [place]. This information is not intended to replace advice given to you by your health care provider. Make sure you discuss any questions you have with your health care provider.

## 2014-11-28 ENCOUNTER — Other Ambulatory Visit: Payer: Self-pay | Admitting: Internal Medicine

## 2014-11-28 DIAGNOSIS — Z1231 Encounter for screening mammogram for malignant neoplasm of breast: Secondary | ICD-10-CM

## 2014-12-06 ENCOUNTER — Ambulatory Visit
Admission: RE | Admit: 2014-12-06 | Discharge: 2014-12-06 | Disposition: A | Discharge status: Home / Self Care | Payer: BLUE CROSS/BLUE SHIELD | Source: Ambulatory Visit | Attending: Internal Medicine | Admitting: Internal Medicine

## 2014-12-06 DIAGNOSIS — Z1231 Encounter for screening mammogram for malignant neoplasm of breast: Secondary | ICD-10-CM | POA: Insufficient documentation

## 2015-11-14 ENCOUNTER — Encounter: Payer: Self-pay | Admitting: Internal Medicine

## 2015-11-14 ENCOUNTER — Ambulatory Visit (INDEPENDENT_AMBULATORY_CARE_PROVIDER_SITE_OTHER): Payer: Managed Care, Other (non HMO) | Admitting: Internal Medicine

## 2015-11-14 VITALS — BP 128/84 | HR 73 | Temp 98.3°F | Ht 63.0 in | Wt 199.0 lb

## 2015-11-14 DIAGNOSIS — M159 Polyosteoarthritis, unspecified: Secondary | ICD-10-CM | POA: Insufficient documentation

## 2015-11-14 DIAGNOSIS — E668 Other obesity: Secondary | ICD-10-CM | POA: Diagnosis not present

## 2015-11-14 DIAGNOSIS — IMO0002 Reserved for concepts with insufficient information to code with codable children: Secondary | ICD-10-CM

## 2015-11-14 DIAGNOSIS — Z0000 Encounter for general adult medical examination without abnormal findings: Secondary | ICD-10-CM

## 2015-11-14 NOTE — Progress Notes (Signed)
Pre visit review using our clinic review tool, if applicable. No additional management support is needed unless otherwise documented below in the visit note. 

## 2015-11-14 NOTE — Assessment & Plan Note (Signed)
Discussed weight watchers, etc

## 2015-11-14 NOTE — Assessment & Plan Note (Signed)
Discussed heat, ibuprofen

## 2015-11-14 NOTE — Progress Notes (Signed)
Subjective:    Patient ID: Mercedes White, female    DOB: 03/05/1956, 60 y.o.   MRN: [id number]  HPI Here for physical  Ongoing pain in fingers Especially painful left 5th DIP Has trigger finger in right 3rd finger Also uses arthritis gloves--for sleep Will use ibuprofen 400mg  rarely-- it does help  Knows about her weight Has tried low carb but has trouble with this Did weight watchers at work--but trouble making the meetings Has "My Fitness Pal" on her phone Inconsistent with physical exertion  Current Outpatient Prescriptions on File Prior to Visit  Medication Sig Dispense Refill  . Cyanocobalamin (B-12) 2500 MCG SUBL Place 1 tablet under the tongue. Reported on 11/14/2015    . Multiple Vitamin (MULTIVITAMIN) tablet Take 1 tablet by mouth daily.      . TURMERIC PO Take by mouth daily. Reported on 11/14/2015    . Zinc 50 MG CAPS Take by mouth daily. Reported on 11/14/2015     No current facility-administered medications on file prior to visit.    Allergies  Allergen Reactions  . Cephalexin     REACTION: rash    Past Medical History  Diagnosis Date  . Migraines     with menses  . Allergy     Past Surgical History  Procedure Laterality Date  . Cesarean section      x1  . Vaginal delivery      x1    Family History  Problem Relation Age of Onset  . Arthritis Mother   . Cancer Mother 50    Breast cancer  . Diabetes Neg Hx   . Hypertension Neg Hx   . Breast cancer Neg Hx     Social History   Social History  . Marital Status: Married    Spouse Name: N/A  . Number of Children: 2  . Years of Education: N/A   Occupational History  . 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 Smoking status: Never Smoker   . Smokeless tobacco: Never Used  . Alcohol Use: Yes  . Drug Use: No  . Sexual Activity: Not on file   Other Topics Concern  . Not on file   Social History Narrative   Review of Systems  Constitutional: Negative for fatigue  and unexpected weight change.       Wears seat belt   HENT: Negative for dental problem, hearing loss and tinnitus.        Keeps up with dentist  Eyes: Negative for visual disturbance.       Overdue for eye exam No diplopia or unilateral vision loss  Respiratory: Negative for cough, chest tightness and shortness of breath.        Gets wheezy if laughs--- goes away quickly  Gastrointestinal: Negative for nausea, abdominal pain, constipation and blood in stool.  Endocrine: Negative for polydipsia and polyuria.  Genitourinary: Negative for dysuria and urgency.       Uses lubricant for sex  Musculoskeletal: Positive for arthralgias. Negative for back pain and joint swelling.  Skin: Negative for rash.       No suspicious lesions  Allergic/Immunologic: Positive for environmental allergies. Negative for immunocompromised state.       Uses loratadine  Neurological: Negative for dizziness, syncope, weakness, light-headedness and headaches.  Hematological: Negative for adenopathy. Does not bruise/bleed easily.  Psychiatric/Behavioral: Negative for sleep disturbance and dysphoric mood. The patient is not nervous/anxious.        Objective:  Physical Exam  Constitutional: She is oriented to person, place, and time. She appears well-developed and well-nourished. No distress.  HENT:  Head: Normocephalic and atraumatic.  Right Ear: External ear normal.  Left Ear: External ear normal.  Mouth/Throat: Oropharynx is clear and moist. No oropharyngeal exudate.  Eyes: Conjunctivae are normal. Pupils are equal, round, and reactive to light.  Neck: Normal range of motion. Neck supple. No thyromegaly present.  Cardiovascular: Normal rate, regular rhythm, normal heart sounds and intact distal pulses.  Exam reveals no gallop.   No murmur heard. Pulmonary/Chest: Effort normal and breath sounds normal. No respiratory distress. She has no wheezes. She has no rales.  Abdominal: Soft. There is no tenderness.    Musculoskeletal: She exhibits no edema or tenderness.  DIP nodules--esp left 5th (with mild contracture there)  Lymphadenopathy:    She has no cervical adenopathy.  Neurological: She is alert and oriented to person, place, and time.  Skin: No rash noted. No erythema.  Psychiatric: Her behavior is normal.          Assessment & Plan:

## 2015-11-14 NOTE — Patient Instructions (Addendum)
You can consider your mammogram in the next month.  DASH Eating Plan DASH stands for "Dietary Approaches to Stop Hypertension." The DASH eating plan is a healthy eating plan that has been shown to reduce high blood pressure (hypertension). Additional health benefits may include reducing the risk of type 2 diabetes mellitus, heart disease, and stroke. The DASH eating plan may also help with weight loss. WHAT DO I NEED TO KNOW ABOUT THE DASH EATING PLAN? For the DASH eating plan, you will follow these general guidelines:  Choose foods with a percent daily value for sodium of less than 5% (as listed on the food label).  Use salt-free seasonings or herbs instead of table salt or sea salt.  Check with your health care provider or pharmacist before using salt substitutes.  Eat lower-sodium products, often labeled as "lower sodium" or "no salt added."  Eat fresh foods.  Eat more vegetables, fruits, and low-fat dairy products.  Choose whole grains. Look for the word "whole" as the first word in the ingredient list.  Choose fish and skinless chicken or [place] more often than red meat. Limit fish, poultry, and meat to 6 oz (170 g) each day.  Limit sweets, desserts, sugars, and sugary drinks.  Choose heart-healthy fats.  Limit cheese to 1 oz (28 g) per day.  Eat more home-cooked food and less restaurant, buffet, and fast food.  Limit fried foods.  Cook foods using methods other than frying.  Limit canned vegetables. If you do use them, rinse them well to decrease the sodium.  When eating at a restaurant, ask that your food be prepared with less salt, or no salt if possible. WHAT FOODS CAN I EAT? Seek help from a dietitian for individual calorie needs. Grains Whole grain or whole wheat bread. Brown rice. Whole grain or whole wheat pasta. Quinoa, bulgur, and whole grain cereals. Low-sodium cereals. Corn or whole wheat flour tortillas. Whole grain cornbread. Whole grain crackers.  Low-sodium crackers. Vegetables Fresh or frozen vegetables (raw, steamed, roasted, or grilled). Low-sodium or reduced-sodium tomato and vegetable juices. Low-sodium or reduced-sodium tomato sauce and paste. Low-sodium or reduced-sodium canned vegetables.  Fruits All fresh, canned (in natural juice), or frozen fruits. Meat and Other Protein Products Ground beef (85% or leaner), grass-fed beef, or beef trimmed of fat. Skinless chicken or [place]. Ground chicken or [place]. Pork trimmed of fat. All fish and seafood. Eggs. Dried beans, peas, or lentils. Unsalted nuts and seeds. Unsalted canned beans. Dairy Low-fat dairy products, such as skim or 1% milk, 2% or reduced-fat cheeses, low-fat ricotta or cottage cheese, or plain low-fat yogurt. Low-sodium or reduced-sodium cheeses. Fats and Oils Tub margarines without trans fats. Light or reduced-fat mayonnaise and salad dressings (reduced sodium). Avocado. Safflower, olive, or canola oils. Natural peanut or almond butter. Other Unsalted popcorn and pretzels. The items listed above may not be a complete list of recommended foods or beverages. Contact your dietitian for more options. WHAT FOODS ARE NOT RECOMMENDED? Grains White bread. White pasta. White rice. Refined cornbread. Bagels and croissants. Crackers that contain trans fat. Vegetables Creamed or fried vegetables. Vegetables in a cheese sauce. Regular canned vegetables. Regular canned tomato sauce and paste. Regular tomato and vegetable juices. Fruits Dried fruits. Canned fruit in light or heavy syrup. Fruit juice. Meat and Other Protein Products Fatty cuts of meat. Ribs, chicken wings, bacon, sausage, bologna, salami, chitterlings, fatback, hot dogs, bratwurst, and packaged luncheon meats. Salted nuts and seeds. Canned beans with salt. Dairy Whole or 2% milk,  cream, half-and-half, and cream cheese. Whole-fat or sweetened yogurt. Full-fat cheeses or blue cheese. Nondairy creamers and whipped  toppings. Processed cheese, cheese spreads, or cheese curds. Condiments Onion and garlic salt, seasoned salt, table salt, and sea salt. Canned and packaged gravies. Worcestershire sauce. Tartar sauce. Barbecue sauce. Teriyaki sauce. Soy sauce, including reduced sodium. Steak sauce. Fish sauce. Oyster sauce. Cocktail sauce. Horseradish. Ketchup and mustard. Meat flavorings and tenderizers. Bouillon cubes. Hot sauce. Tabasco sauce. Marinades. Taco seasonings. Relishes. Fats and Oils Butter, stick margarine, lard, shortening, ghee, and bacon fat. Coconut, palm kernel, or palm oils. Regular salad dressings. Other Pickles and olives. Salted popcorn and pretzels. The items listed above may not be a complete list of foods and beverages to avoid. Contact your dietitian for more information. WHERE CAN I FIND MORE INFORMATION? National Heart, Lung, and Blood Institute: travelstabloid.com   This information is not intended to replace advice given to you by your health care provider. Make sure you discuss any questions you have with your health care provider.   Document Released: 04/11/2011 Document Revised: 05/13/2014 Document Reviewed: 02/24/2013 Elsevier Interactive Patient Education [employer].

## 2015-11-14 NOTE — Assessment & Plan Note (Signed)
Healthy but needs to work on fitness Consider yearly mammogram due to mom's history Colon due 2020 Pap due next year

## 2015-11-15 LAB — CBC WITH DIFFERENTIAL/PLATELET
BASOS ABS: 0 10*3/uL (ref 0.0–0.2)
BASOS: 0 %
EOS (ABSOLUTE): 0.2 10*3/uL (ref 0.0–0.4)
Eos: 2 %
Hematocrit: 40.3 % (ref 34.0–46.6)
Hemoglobin: 13.3 g/dL (ref 11.1–15.9)
IMMATURE GRANS (ABS): 0 10*3/uL (ref 0.0–0.1)
IMMATURE GRANULOCYTES: 0 %
LYMPHS: 40 %
Lymphocytes Absolute: 2.9 10*3/uL (ref 0.7–3.1)
MCH: 29.4 pg (ref 26.6–33.0)
MCHC: 33 g/dL (ref 31.5–35.7)
MCV: 89 fL (ref 79–97)
MONOS ABS: 0.4 10*3/uL (ref 0.1–0.9)
Monocytes: 5 %
NEUTROS PCT: 53 %
Neutrophils Absolute: 3.9 10*3/uL (ref 1.4–7.0)
PLATELETS: 302 10*3/uL (ref 150–379)
RBC: 4.53 x10E6/uL (ref 3.77–5.28)
RDW: 14.5 % (ref 12.3–15.4)
WBC: 7.4 10*3/uL (ref 3.4–10.8)

## 2015-11-15 LAB — COMPREHENSIVE METABOLIC PANEL
A/G RATIO: 1.3 (ref 1.2–2.2)
ALK PHOS: 87 IU/L (ref 39–117)
ALT: 20 IU/L (ref 0–32)
AST: 17 IU/L (ref 0–40)
Albumin: 4.3 g/dL (ref 3.5–5.5)
BUN/Creatinine Ratio: 17 (ref 9–23)
BUN: 10 mg/dL (ref 6–24)
CHLORIDE: 105 mmol/L (ref 96–106)
CO2: 22 mmol/L (ref 18–29)
Calcium: 9.2 mg/dL (ref 8.7–10.2)
Creatinine, Ser: 0.6 mg/dL (ref 0.57–1.00)
GFR calc Af Amer: 116 mL/min/{1.73_m2} (ref >59)
GFR calc non Af Amer: 101 mL/min/{1.73_m2} (ref >59)
GLOBULIN, TOTAL: 3.2 g/dL (ref 1.5–4.5)
Glucose: 102 mg/dL — ABNORMAL HIGH (ref 65–99)
POTASSIUM: 4.6 mmol/L (ref 3.5–5.2)
SODIUM: 144 mmol/L (ref 134–144)
Total Protein: 7.5 g/dL (ref 6.0–8.5)

## 2015-11-15 LAB — LIPID PANEL
CHOL/HDL RATIO: 2.9 ratio (ref 0.0–4.4)
Cholesterol, Total: 197 mg/dL (ref 100–199)
HDL: 69 mg/dL (ref >39)
LDL Calculated: 108 mg/dL — ABNORMAL HIGH (ref 0–99)
TRIGLYCERIDES: 101 mg/dL (ref 0–149)
VLDL Cholesterol Cal: 20 mg/dL (ref 5–40)

## 2015-11-15 LAB — T4, FREE: FREE T4: 1.15 ng/dL (ref 0.82–1.77)

## 2015-12-11 ENCOUNTER — Other Ambulatory Visit: Payer: Self-pay | Admitting: Internal Medicine

## 2015-12-11 DIAGNOSIS — Z1231 Encounter for screening mammogram for malignant neoplasm of breast: Secondary | ICD-10-CM

## 2015-12-25 ENCOUNTER — Ambulatory Visit
Admission: RE | Admit: 2015-12-25 | Discharge: 2015-12-25 | Disposition: A | Discharge status: Home / Self Care | Payer: Managed Care, Other (non HMO) | Source: Ambulatory Visit | Attending: Internal Medicine | Admitting: Internal Medicine

## 2015-12-25 ENCOUNTER — Other Ambulatory Visit: Payer: Self-pay | Admitting: Internal Medicine

## 2015-12-25 DIAGNOSIS — Z1231 Encounter for screening mammogram for malignant neoplasm of breast: Secondary | ICD-10-CM | POA: Diagnosis present

## 2016-10-22 IMAGING — MG MM DIGITAL SCREENING BILAT W/ TOMO W/ CAD
8 of 12 series · 8 of 28 positions shown · non-contrast
Comparison: Previous exam(s).

CLINICAL DATA: Screening.

EXAM:
2D DIGITAL SCREENING BILATERAL MAMMOGRAM WITH CAD AND ADJUNCT TOMO

[L CC synth-2D]
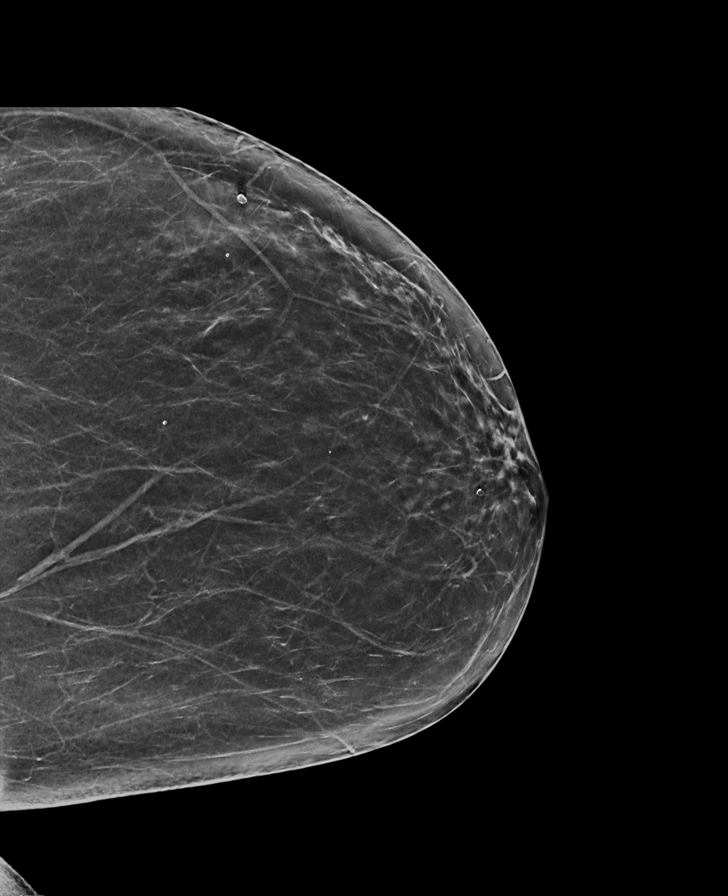

[R MLO]
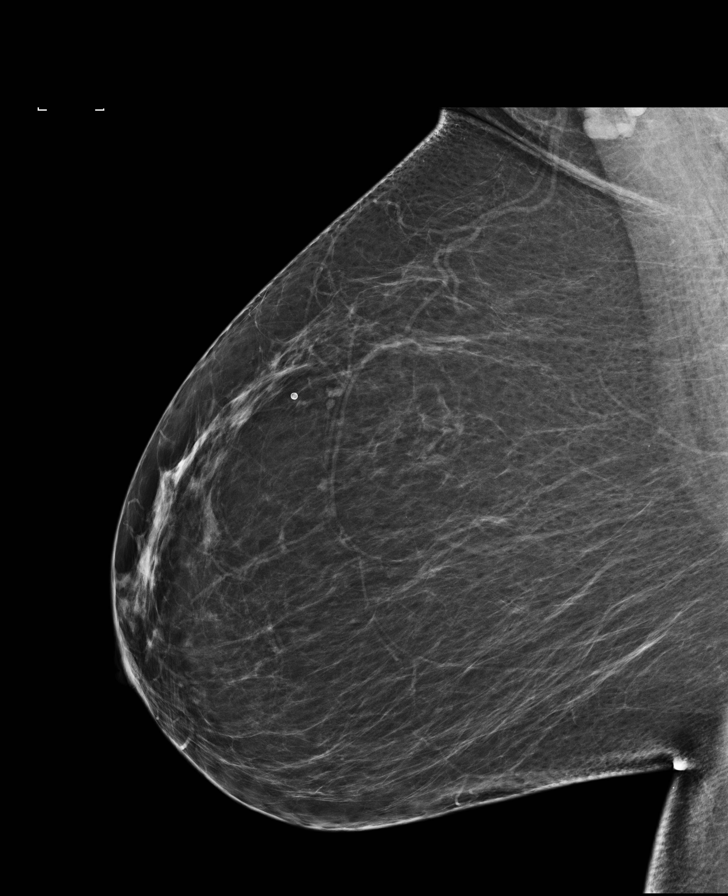

[L MLO synth-2D]
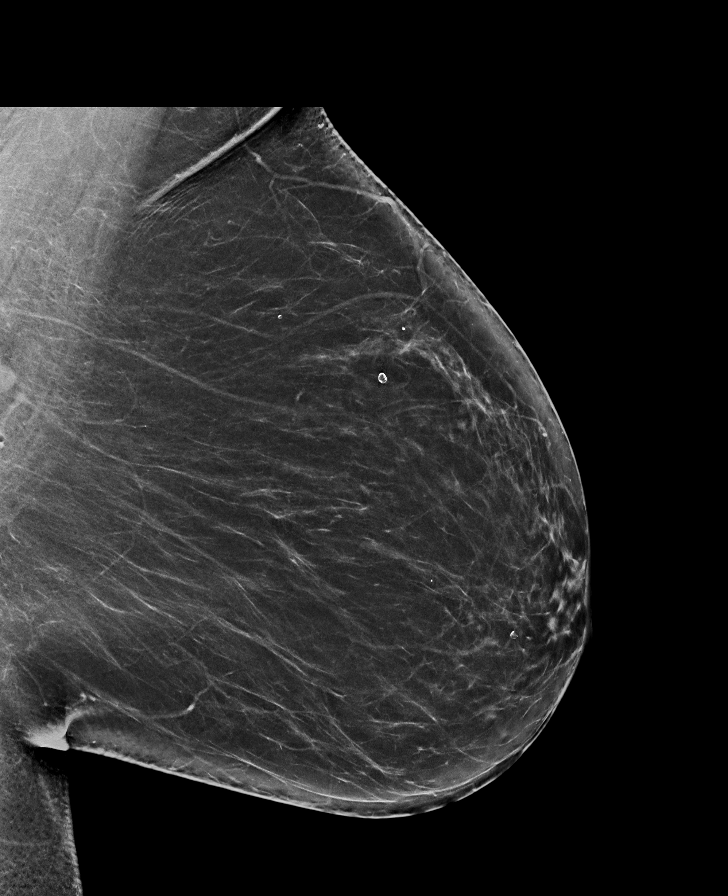

[R MLO synth-2D]
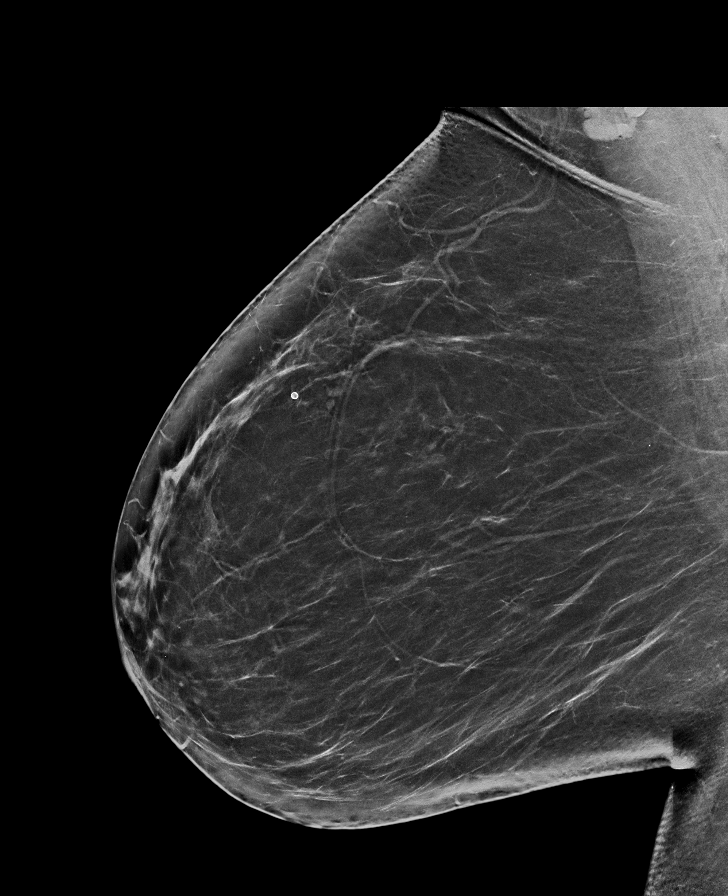

[L MLO]
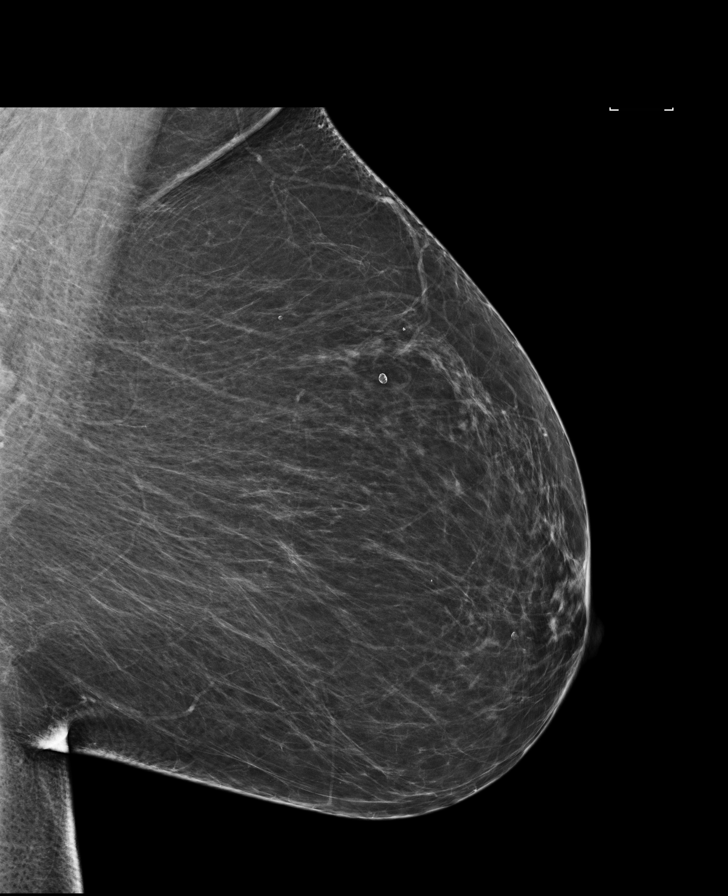

[R CC]
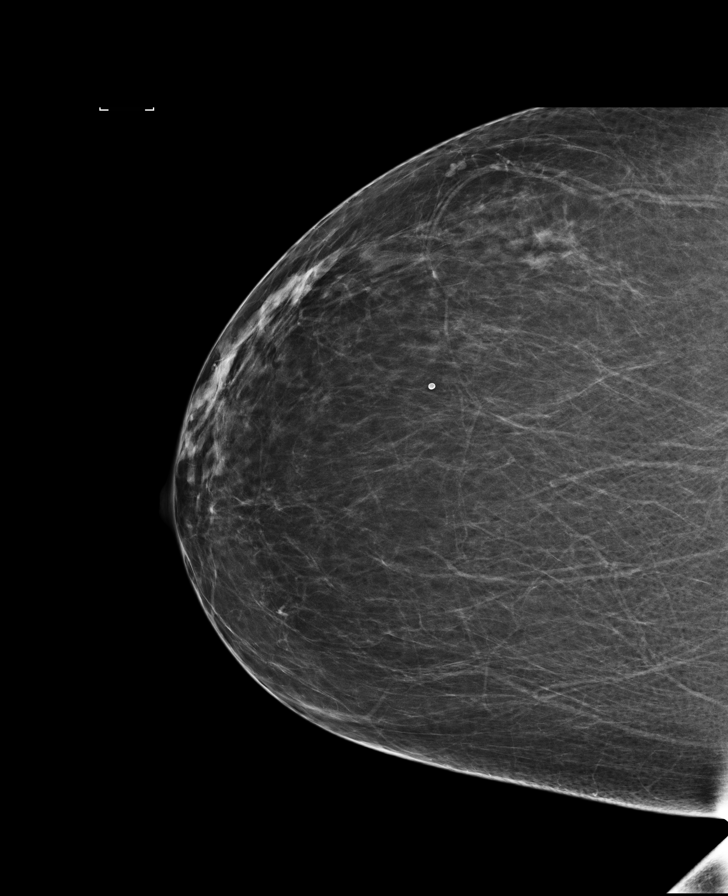

[R CC synth-2D]
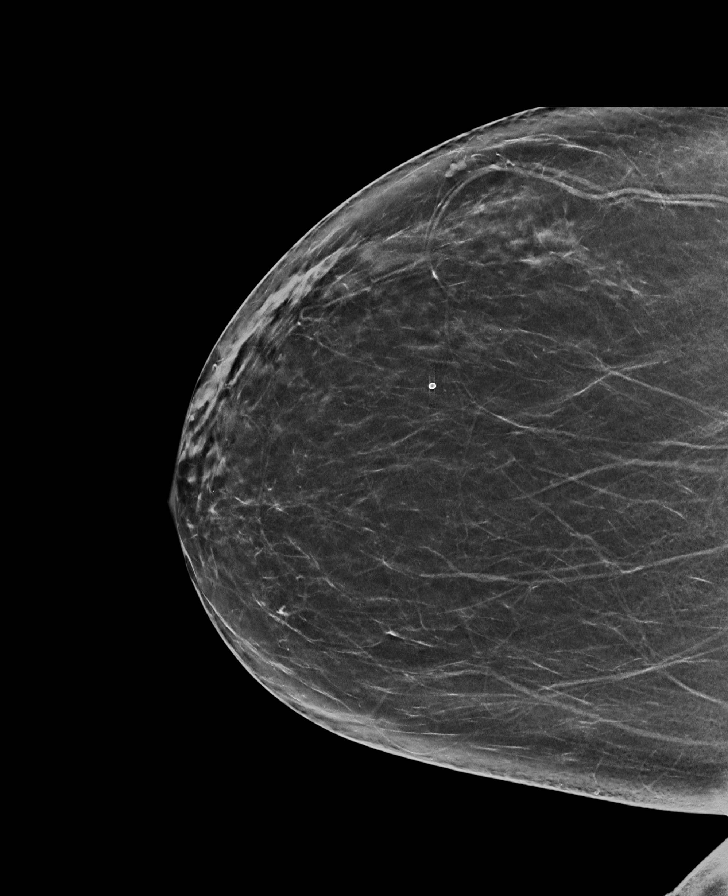

[L CC]
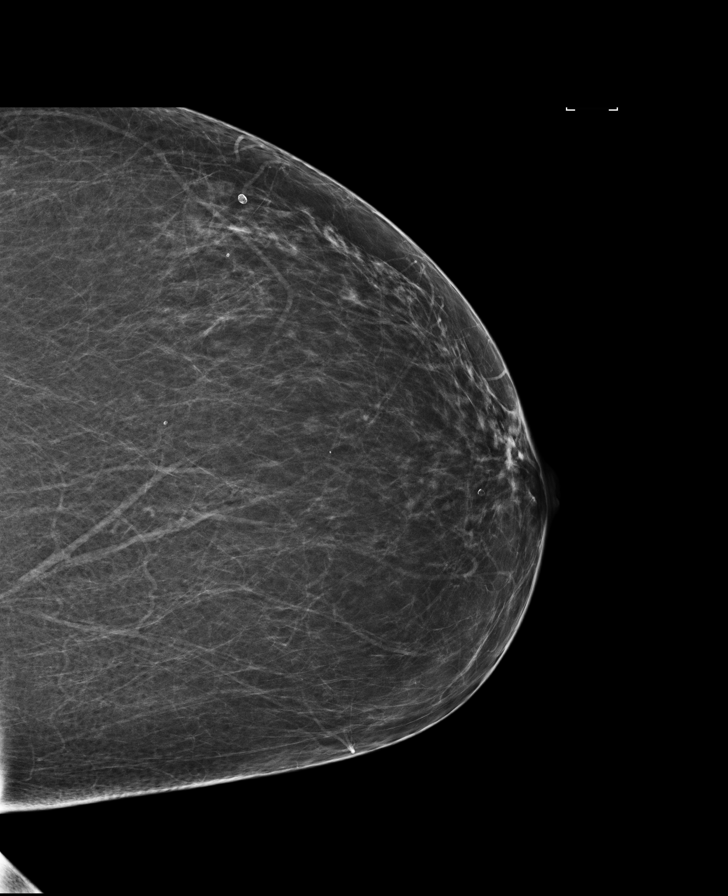

[8 of 28 positions shown; findings below may reference images not displayed]

ACR Breast Density Category b: There are scattered areas of
fibroglandular density.
FINDINGS: There are no findings suspicious for malignancy. Images were
processed with CAD.
IMPRESSION: No mammographic evidence of malignancy. A result letter of this
screening mammogram will be mailed directly to the patient.

RECOMMENDATION:
Screening mammogram in one year. (Code:97-6-RS4)

BI-RADS CATEGORY  1: Negative.

## 2016-11-15 ENCOUNTER — Ambulatory Visit (INDEPENDENT_AMBULATORY_CARE_PROVIDER_SITE_OTHER): Payer: Managed Care, Other (non HMO) | Admitting: Internal Medicine

## 2016-11-15 ENCOUNTER — Encounter: Payer: Self-pay | Admitting: Internal Medicine

## 2016-11-15 VITALS — BP 126/74 | HR 70 | Temp 98.4°F | Ht 63.0 in | Wt 189.0 lb

## 2016-11-15 DIAGNOSIS — M159 Polyosteoarthritis, unspecified: Secondary | ICD-10-CM | POA: Diagnosis not present

## 2016-11-15 DIAGNOSIS — Z0000 Encounter for general adult medical examination without abnormal findings: Secondary | ICD-10-CM

## 2016-11-15 DIAGNOSIS — E669 Obesity, unspecified: Secondary | ICD-10-CM | POA: Diagnosis not present

## 2016-11-15 NOTE — Assessment & Plan Note (Signed)
Mostly hands Doesn't use meds often

## 2016-11-15 NOTE — Assessment & Plan Note (Signed)
Will check labs Has lost some weight

## 2016-11-15 NOTE — Assessment & Plan Note (Signed)
Pap today Mammogram due in 2019 Colon due 2020 Working on lifestyle--has lost weight, needs to do more exercise UTD on vaccines--yearly flu

## 2016-11-15 NOTE — Addendum Note (Signed)
Addended by: Pilar Grammes on: 11/15/2016 09:30 AM   Modules accepted: Orders

## 2016-11-15 NOTE — Progress Notes (Signed)
Subjective:    Patient ID: Mercedes White, female    DOB: 09/09/1955, 61 y.o.   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 Cyanocobalamin (B-12) 2500 MCG SUBL Place 1 tablet under the tongue. Reported on 11/14/2015    . loratadine (CLARITIN) 10 MG tablet Take 10 mg by mouth daily.    . Multiple Vitamin (MULTIVITAMIN) tablet Take 1 tablet by mouth daily.      . TURMERIC PO Take by mouth daily. Reported on 11/14/2015    . Zinc 50 MG CAPS Take by mouth daily. Reported on 11/14/2015     No current facility-administered medications on file prior to visit.     Allergies  Allergen Reactions  . Cephalexin     REACTION: rash    Past Medical History:  Diagnosis Date  . Allergy   . Migraines    with menses    Past Surgical History:  Procedure Laterality Date  . CESAREAN SECTION     x1  . VAGINAL DELIVERY     x1    Family History  Problem Relation Age of Onset  . Arthritis Mother   . Cancer Mother 80       Breast cancer  . Diabetes Neg Hx   . Hypertension Neg Hx   . Breast cancer Neg Hx     Social History   Social History  . Marital status: Married    Spouse name: N/A  . Number of children: 2  . Years of education: N/A   Occupational History  . 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 Smoking status: Never Smoker  . Smokeless tobacco: Never Used  . Alcohol use Yes  . Drug use: No  . Sexual activity: Not on file   Other Topics Concern  . Not on file   Social History Narrative  . No narrative on file   Review of Systems  Constitutional:       Back on  Weight Watchers--- likes this better than Atkins Has lost 10# Wears seat belt  HENT: Positive for hearing loss. Negative for dental problem and tinnitus.        Keeps up with dentist  Eyes: Negative for visual disturbance.       No diplopia or unilateral vision loss  Respiratory: Negative for chest tightness and shortness of breath.        Will have cough after laughing spell  Cardiovascular: Positive for palpitations. Negative for chest pain and leg swelling.  Gastrointestinal: Positive for constipation. Negative for abdominal pain, blood in stool and nausea.  Endocrine: Positive for polydipsia. Negative for polyuria.  Genitourinary: Positive for dyspareunia. Negative for dysuria and hematuria.       Uses lubricant  Musculoskeletal: Positive for arthralgias. Negative for joint swelling.       Mild back pain at times  Skin: Negative for rash.       No suspicious lesions  Allergic/Immunologic: Positive for environmental allergies. Negative for immunocompromised state.       Uses loratadine  Neurological: Positive for headaches. Negative for dizziness, syncope  and light-headedness.  Hematological: Negative for adenopathy. Does not bruise/bleed easily.  Psychiatric/Behavioral: Negative for dysphoric mood and sleep disturbance. The patient is not nervous/anxious.        Has work stress       Objective:   Physical Exam  Constitutional: She is oriented to person, place, and time. She appears well-developed and well-nourished. No distress.  HENT:  Head: Normocephalic and atraumatic.  Right Ear: External ear normal.  Left Ear: External ear normal.  Mouth/Throat: Oropharynx is clear and moist. No oropharyngeal exudate.  Eyes: Pupils are equal, round, and reactive to light. Conjunctivae are normal.  Neck: Normal range of motion. Neck supple. No thyromegaly present.  Cardiovascular: Normal rate, regular rhythm, normal heart sounds and intact distal pulses.  Exam reveals no gallop.   No  murmur heard. Pulmonary/Chest: Effort normal and breath sounds normal. No respiratory distress. She has no wheezes. She has no rales.  Abdominal: Soft. There is no tenderness.  Genitourinary:  Genitourinary Comments: Normal introitus and cervix Pap done  Musculoskeletal: She exhibits no edema or tenderness.  Lymphadenopathy:    She has no cervical adenopathy.  Neurological: She is alert and oriented to person, place, and time.  Skin: No rash noted. No erythema.  Psychiatric: She has a normal mood and affect. Her behavior is normal.          Assessment & Plan:

## 2016-11-16 LAB — CBC WITH DIFFERENTIAL/PLATELET
BASOS ABS: 0 10*3/uL (ref 0.0–0.2)
Basos: 0 %
EOS (ABSOLUTE): 0.1 10*3/uL (ref 0.0–0.4)
Eos: 2 %
HEMOGLOBIN: 13.7 g/dL (ref 11.1–15.9)
Hematocrit: 40.4 % (ref 34.0–46.6)
Immature Grans (Abs): 0 10*3/uL (ref 0.0–0.1)
Immature Granulocytes: 0 %
LYMPHS ABS: 2.3 10*3/uL (ref 0.7–3.1)
Lymphs: 35 %
MCH: 29.4 pg (ref 26.6–33.0)
MCHC: 33.9 g/dL (ref 31.5–35.7)
MCV: 87 fL (ref 79–97)
MONOCYTES: 6 %
MONOS ABS: 0.4 10*3/uL (ref 0.1–0.9)
NEUTROS ABS: 3.8 10*3/uL (ref 1.4–7.0)
Neutrophils: 57 %
PLATELETS: 302 10*3/uL (ref 150–379)
RBC: 4.66 x10E6/uL (ref 3.77–5.28)
RDW: 14.7 % (ref 12.3–15.4)
WBC: 6.6 10*3/uL (ref 3.4–10.8)

## 2016-11-16 LAB — COMPREHENSIVE METABOLIC PANEL
A/G RATIO: 1.6 (ref 1.2–2.2)
ALBUMIN: 4.4 g/dL (ref 3.5–5.5)
ALK PHOS: 80 IU/L (ref 39–117)
ALT: 21 IU/L (ref 0–32)
AST: 18 IU/L (ref 0–40)
BILIRUBIN TOTAL: 0.4 mg/dL (ref 0.0–1.2)
BUN / CREAT RATIO: 17 (ref 9–23)
BUN: 12 mg/dL (ref 6–24)
CHLORIDE: 100 mmol/L (ref 96–106)
CO2: 23 mmol/L (ref 20–29)
Calcium: 9.9 mg/dL (ref 8.7–10.2)
Creatinine, Ser: 0.71 mg/dL (ref 0.57–1.00)
GFR calc non Af Amer: 94 mL/min/{1.73_m2} (ref >59)
GFR, EST AFRICAN AMERICAN: 108 mL/min/{1.73_m2} (ref >59)
GLUCOSE: 112 mg/dL — AB (ref 65–99)
Globulin, Total: 2.8 g/dL (ref 1.5–4.5)
POTASSIUM: 4.4 mmol/L (ref 3.5–5.2)
Sodium: 140 mmol/L (ref 134–144)
TOTAL PROTEIN: 7.2 g/dL (ref 6.0–8.5)

## 2016-11-16 LAB — T4, FREE: FREE T4: 1.26 ng/dL (ref 0.82–1.77)

## 2016-11-20 LAB — PAP LB (LIQUID-BASED): PAP Smear Comment: 0

## 2016-12-05 ENCOUNTER — Telehealth: Payer: Self-pay

## 2016-12-05 NOTE — Telephone Encounter (Signed)
Pt left v/m; pt seen 11/15/16 for annual exam; at exam pt discussed lips peeling, red, puffy and dry mouth; pt using vaseline and stopped OTC allergy med as instructed. Today pt's lips are red, puffy, numb and still has dry mouth. Pt request cb with what to do. [facility] [address]

## 2016-12-05 NOTE — Telephone Encounter (Signed)
Spoke to pt. She will let us know if she needs a referral. 

## 2016-12-05 NOTE — Telephone Encounter (Signed)
Should probably stop the vaseline. Should consider vitamin with zinc, or zinc supplement, for a month May need to see dermatologist

## 2016-12-11 ENCOUNTER — Encounter: Payer: Self-pay | Admitting: Internal Medicine

## 2016-12-11 DIAGNOSIS — K130 Diseases of lips: Secondary | ICD-10-CM

## 2016-12-12 NOTE — Telephone Encounter (Signed)
Please work on and let her know about the ordered dermatology referral

## 2016-12-12 NOTE — Telephone Encounter (Signed)
I spoke with pt and explained to her that the computers were down all afternoon yesterday and that was the reason she was not contacted.  Pt wanted to go to [facility].  She is aware I have to fax referral and they will contact her to schedule appointment.  Pt also has address and phone number to [facility]

## 2017-02-07 ENCOUNTER — Other Ambulatory Visit: Payer: Self-pay | Admitting: Internal Medicine

## 2017-02-07 DIAGNOSIS — Z1231 Encounter for screening mammogram for malignant neoplasm of breast: Secondary | ICD-10-CM

## 2017-02-11 ENCOUNTER — Ambulatory Visit
Admission: RE | Admit: 2017-02-11 | Discharge: 2017-02-11 | Disposition: A | Discharge status: Home / Self Care | Payer: Managed Care, Other (non HMO) | Source: Ambulatory Visit | Attending: Internal Medicine | Admitting: Internal Medicine

## 2017-02-11 DIAGNOSIS — Z1231 Encounter for screening mammogram for malignant neoplasm of breast: Secondary | ICD-10-CM | POA: Diagnosis present

## 2017-05-16 ENCOUNTER — Encounter: Payer: Self-pay | Admitting: Internal Medicine

## 2017-05-16 ENCOUNTER — Ambulatory Visit (INDEPENDENT_AMBULATORY_CARE_PROVIDER_SITE_OTHER): Payer: Managed Care, Other (non HMO) | Admitting: Internal Medicine

## 2017-05-16 VITALS — BP 110/76 | HR 77 | Temp 98.3°F | Wt 199.0 lb

## 2017-05-16 DIAGNOSIS — R059 Cough, unspecified: Secondary | ICD-10-CM

## 2017-05-16 DIAGNOSIS — R05 Cough: Secondary | ICD-10-CM | POA: Diagnosis not present

## 2017-05-16 NOTE — Assessment & Plan Note (Signed)
Seems to have classic post infectious cough Discussed pertussis and other infections that could have caused this Doesn't feel sick No evidence of bronchospasm Continue symptomatic treatment to suppress cough---cough drops and dextromethorphan Discussed self limited nature

## 2017-05-16 NOTE — Progress Notes (Signed)
Subjective:    Patient ID: Mercedes White, female    DOB: 06/01/1955, 62 y.o.   MRN: [id number]  HPI Here due to respiratory illness  Having a cough for at least 6 weeks Raspy breathing yesterday Gets coughing jags intermittently  Tried dayquil/nyquil--no help other than slept better on the nyquil  Coricidin some help  Did start with typical cold--- in head but then moved to chest No ear pain Throat not a problem Very little sputum Thinks she has some wheezing SOB yesterday---better with coricidin  Current Outpatient Medications on File Prior to Visit  Medication Sig Dispense Refill  . aspirin 81 MG tablet Take 81 mg by mouth daily.    . Biotin w/ Vitamins C & E (HAIR/SKIN/NAILS PO) Take by mouth.    . cetirizine (ZYRTEC) 10 MG tablet Take 10 mg by mouth daily.    . Cyanocobalamin (B-12) 2500 MCG SUBL Place 1 tablet under the tongue. Reported on 11/14/2015    . EVENING PRIMROSE OIL PO Take 1 tablet by mouth.    . Magnesium 400 MG TABS Take 1 tablet by mouth.    . Multiple Vitamin (MULTIVITAMIN) tablet Take 1 tablet by mouth daily.      . TURMERIC PO Take by mouth daily. Reported on 11/14/2015    . Zinc 50 MG CAPS Take by mouth daily. Reported on 11/14/2015     No current facility-administered medications on file prior to visit.     Allergies  Allergen Reactions  . Cephalexin     REACTION: rash    Past Medical History:  Diagnosis Date  . Allergy   . Migraines    with menses    Past Surgical History:  Procedure Laterality Date  . CESAREAN SECTION     x1  . VAGINAL DELIVERY     x1    Family History  Problem Relation Age of Onset  . Arthritis Mother   . Cancer Mother 50       Breast cancer  . Breast cancer Mother 8  . Diabetes Neg Hx   . Hypertension Neg Hx     Social History   Socioeconomic History  . Marital status: Married    Spouse name: Not on file  . Number of children: 2  . Years of education: Not on file  . Highest education level:  Not on file  Social Needs  . Financial resource strain: Not on file  . Food insecurity - worry: Not on file  . Food insecurity - inability: Not on file  . Transportation needs - medical: Not on file  . Transportation needs - non-medical: Not on file  Occupational History  . Occupation: patient Sport and exercise psychologist: [employer]  Tobacco Use  . Smoking status: Never Smoker  . Smokeless tobacco: Never Used  Substance and Sexual Activity  . Alcohol use: Yes  . Drug use: No  . Sexual activity: Not on file  Other Topics Concern  . Not on file  Social History Narrative  . Not on file   Review of Systems  Sleeping okay--but does have some spells at night No rash--other than fungal thing under abd fold No vomiting or diarrhea No ill exposures     Objective:   Physical Exam  Constitutional: No distress.  HENT:  Mouth/Throat: Oropharynx is clear and moist. No oropharyngeal exudate.  No sinus tenderness TMs normal  Neck: No thyromegaly present.  Pulmonary/Chest: Effort normal and breath sounds normal. No respiratory distress. She  has no wheezes. She has no rales.  Lymphadenopathy:    She has no cervical adenopathy.          Assessment & Plan:

## 2017-11-28 ENCOUNTER — Encounter: Payer: Managed Care, Other (non HMO) | Admitting: Internal Medicine

## 2017-12-02 ENCOUNTER — Encounter: Payer: Self-pay | Admitting: Internal Medicine

## 2017-12-02 ENCOUNTER — Ambulatory Visit (INDEPENDENT_AMBULATORY_CARE_PROVIDER_SITE_OTHER): Payer: Managed Care, Other (non HMO) | Admitting: Internal Medicine

## 2017-12-02 VITALS — BP 106/68 | HR 73 | Temp 98.4°F | Ht 63.0 in | Wt 197.0 lb

## 2017-12-02 DIAGNOSIS — Z0000 Encounter for general adult medical examination without abnormal findings: Secondary | ICD-10-CM

## 2017-12-02 DIAGNOSIS — R7303 Prediabetes: Secondary | ICD-10-CM | POA: Insufficient documentation

## 2017-12-02 DIAGNOSIS — R7309 Other abnormal glucose: Secondary | ICD-10-CM | POA: Diagnosis not present

## 2017-12-02 NOTE — Assessment & Plan Note (Signed)
Discussed metformin--she prefers to work on lifestyle Will recheck with A1c

## 2017-12-02 NOTE — Assessment & Plan Note (Signed)
Healthy but needs to work on fitness Colon due next March Mammogram every 1-2 years Pap due in 2 years Yearly flu vaccine

## 2017-12-02 NOTE — Patient Instructions (Signed)
DASH Eating Plan DASH stands for "Dietary Approaches to Stop Hypertension." The DASH eating plan is a healthy eating plan that has been shown to reduce high blood pressure (hypertension). It may also reduce your risk for type 2 diabetes, heart disease, and stroke. The DASH eating plan may also help with weight loss. What are tips for following this plan? General guidelines  Avoid eating more than 2,300 mg (milligrams) of salt (sodium) a day. If you have hypertension, you may need to reduce your sodium intake to 1,500 mg a day.  Limit alcohol intake to no more than 1 drink a day for nonpregnant women and 2 drinks a day for men. One drink equals 12 oz of beer, 5 oz of wine, or 1 oz of hard liquor.  Work with your health care provider to maintain a healthy body weight or to lose weight. Ask what an ideal weight is for you.  Get at least 30 minutes of exercise that causes your heart to beat faster (aerobic exercise) most days of the week. Activities may include walking, swimming, or biking.  Work with your health care provider or diet and nutrition specialist (dietitian) to adjust your eating plan to your individual calorie needs. Reading food labels  Check food labels for the amount of sodium per serving. Choose foods with less than 5 percent of the Daily Value of sodium. Generally, foods with less than 300 mg of sodium per serving fit into this eating plan.  To find whole grains, look for the word "whole" as the first word in the ingredient list. Shopping  Buy products labeled as "low-sodium" or "no salt added."  Buy fresh foods. Avoid canned foods and premade or frozen meals. Cooking  Avoid adding salt when cooking. Use salt-free seasonings or herbs instead of table salt or sea salt. Check with your health care provider or pharmacist before using salt substitutes.  Do not fry foods. Cook foods using healthy methods such as baking, boiling, grilling, and broiling instead.  Cook with  heart-healthy oils, such as olive, canola, soybean, or sunflower oil. Meal planning   Eat a balanced diet that includes: ? 5 or more servings of fruits and vegetables each day. At each meal, try to fill half of your plate with fruits and vegetables. ? Up to 6-8 servings of whole grains each day. ? Less than 6 oz of lean meat, poultry, or fish each day. A 3-oz serving of meat is about the same size as a deck of cards. One egg equals 1 oz. ? 2 servings of low-fat dairy each day. ? A serving of nuts, seeds, or beans 5 times each week. ? Heart-healthy fats. Healthy fats called Omega-3 fatty acids are found in foods such as flaxseeds and coldwater fish, like sardines, salmon, and mackerel.  Limit how much you eat of the following: ? Canned or prepackaged foods. ? Food that is high in trans fat, such as fried foods. ? Food that is high in saturated fat, such as fatty meat. ? Sweets, desserts, sugary drinks, and other foods with added sugar. ? Full-fat dairy products.  Do not salt foods before eating.  Try to eat at least 2 vegetarian meals each week.  Eat more home-cooked food and less restaurant, buffet, and fast food.  When eating at a restaurant, ask that your food be prepared with less salt or no salt, if possible. What foods are recommended? The items listed may not be a complete list. Talk with your dietitian about what   dietary choices are best for you. Grains Whole-grain or whole-wheat bread. Whole-grain or whole-wheat pasta. Brown rice. Oatmeal. Quinoa. Bulgur. Whole-grain and low-sodium cereals. Pita bread. Low-fat, low-sodium crackers. Whole-wheat flour tortillas. Vegetables Fresh or frozen vegetables (raw, steamed, roasted, or grilled). Low-sodium or reduced-sodium tomato and vegetable juice. Low-sodium or reduced-sodium tomato sauce and tomato paste. Low-sodium or reduced-sodium canned vegetables. Fruits All fresh, dried, or frozen fruit. Canned fruit in natural juice (without  added sugar). Meat and other protein foods Skinless chicken or turkey. Ground chicken or turkey. Pork with fat trimmed off. Fish and seafood. Egg whites. Dried beans, peas, or lentils. Unsalted nuts, nut butters, and seeds. Unsalted canned beans. Lean cuts of beef with fat trimmed off. Low-sodium, lean deli meat. Dairy Low-fat (1%) or fat-free (skim) milk. Fat-free, low-fat, or reduced-fat cheeses. Nonfat, low-sodium ricotta or cottage cheese. Low-fat or nonfat yogurt. Low-fat, low-sodium cheese. Fats and oils Soft margarine without trans fats. Vegetable oil. Low-fat, reduced-fat, or light mayonnaise and salad dressings (reduced-sodium). Canola, safflower, olive, soybean, and sunflower oils. Avocado. Seasoning and other foods Herbs. Spices. Seasoning mixes without salt. Unsalted popcorn and pretzels. Fat-free sweets. What foods are not recommended? The items listed may not be a complete list. Talk with your dietitian about what dietary choices are best for you. Grains Baked goods made with fat, such as croissants, muffins, or some breads. Dry pasta or rice meal packs. Vegetables Creamed or fried vegetables. Vegetables in a cheese sauce. Regular canned vegetables (not low-sodium or reduced-sodium). Regular canned tomato sauce and paste (not low-sodium or reduced-sodium). Regular tomato and vegetable juice (not low-sodium or reduced-sodium). Pickles. Olives. Fruits Canned fruit in a light or heavy syrup. Fried fruit. Fruit in cream or butter sauce. Meat and other protein foods Fatty cuts of meat. Ribs. Fried meat. Bacon. Sausage. Bologna and other processed lunch meats. Salami. Fatback. Hotdogs. Bratwurst. Salted nuts and seeds. Canned beans with added salt. Canned or smoked fish. Whole eggs or egg yolks. Chicken or turkey with skin. Dairy Whole or 2% milk, cream, and half-and-half. Whole or full-fat cream cheese. Whole-fat or sweetened yogurt. Full-fat cheese. Nondairy creamers. Whipped toppings.  Processed cheese and cheese spreads. Fats and oils Butter. Stick margarine. Lard. Shortening. Ghee. Bacon fat. Tropical oils, such as coconut, palm kernel, or palm oil. Seasoning and other foods Salted popcorn and pretzels. Onion salt, garlic salt, seasoned salt, table salt, and sea salt. Worcestershire sauce. Tartar sauce. Barbecue sauce. Teriyaki sauce. Soy sauce, including reduced-sodium. Steak sauce. Canned and packaged gravies. Fish sauce. Oyster sauce. Cocktail sauce. Horseradish that you find on the shelf. Ketchup. Mustard. Meat flavorings and tenderizers. Bouillon cubes. Hot sauce and Tabasco sauce. Premade or packaged marinades. Premade or packaged taco seasonings. Relishes. Regular salad dressings. Where to find more information:  National Heart, Lung, and Blood Institute: www.nhlbi.nih.gov  American Heart Association: www.heart.org Summary  The DASH eating plan is a healthy eating plan that has been shown to reduce high blood pressure (hypertension). It may also reduce your risk for type 2 diabetes, heart disease, and stroke.  With the DASH eating plan, you should limit salt (sodium) intake to 2,300 mg a day. If you have hypertension, you may need to reduce your sodium intake to 1,500 mg a day.  When on the DASH eating plan, aim to eat more fresh fruits and vegetables, whole grains, lean proteins, low-fat dairy, and heart-healthy fats.  Work with your health care provider or diet and nutrition specialist (dietitian) to adjust your eating plan to your individual   calorie needs. This information is not intended to replace advice given to you by your health care provider. Make sure you discuss any questions you have with your health care provider. Document Released: 04/11/2011 Document Revised: 04/15/2016 Document Reviewed: 04/15/2016 Elsevier Interactive Patient Education  2018 [employer]  

## 2017-12-02 NOTE — Progress Notes (Signed)
Subjective:    Patient ID: Mercedes White, female    DOB: 1955/09/06, 62 y.o.   MRN: [id number]  HPI Here for physical  Doing okay Still with dry skin but lips are some better Has been diagnosed with lichen planus in mouth by dentist Follows with dermatologist---Dr Saundra Shelling to be careful with diet----but inconsistent Walks some at work----tries to get 8000 steps a day Housework otherwise  Current Outpatient Medications on File Prior to Visit  Medication Sig Dispense Refill  . aspirin 81 MG tablet Take 81 mg by mouth daily.    . Biotin w/ Vitamins C & E (HAIR/SKIN/NAILS PO) Take by mouth.    . cetirizine (ZYRTEC) 10 MG tablet Take 20 mg by mouth daily.     . Cyanocobalamin (B-12) 2500 MCG SUBL Place 1 tablet under the tongue. Reported on 11/14/2015    . EVENING PRIMROSE OIL PO Take 1 tablet by mouth.    . Magnesium 400 MG TABS Take 1 tablet by mouth.    . Multiple Vitamin (MULTIVITAMIN) tablet Take 1 tablet by mouth daily.      . TURMERIC PO Take by mouth daily. Reported on 11/14/2015    . Zinc 50 MG CAPS Take by mouth daily. Reported on 11/14/2015     No current facility-administered medications on file prior to visit.     Allergies  Allergen Reactions  . Cephalexin     REACTION: rash    Past Medical History:  Diagnosis Date  . Allergy   . Migraines    with menses    Past Surgical History:  Procedure Laterality Date  . CESAREAN SECTION     x1  . VAGINAL DELIVERY     x1    Family History  Problem Relation Age of Onset  . Arthritis Mother   . Cancer Mother 72       Breast cancer  . Breast cancer Mother 64  . Diabetes Neg Hx   . Hypertension Neg Hx     Social History   Socioeconomic History  . Marital status: Married    Spouse name: Not on file  . Number of children: 2  . Years of education: Not on file  . Highest education level: Not on file  Occupational History  . 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  Financial resource strain: Not on file  . Food insecurity:    Worry: Not on file    Inability: Not on file  . Transportation needs:    Medical: Not on file    Non-medical: Not on file  Tobacco Use  . Smoking status: Never Smoker  . Smokeless tobacco: Never Used  Substance and Sexual Activity  . Alcohol use: Yes  . Drug use: No  . Sexual activity: Not on file  Lifestyle  . Physical activity:    Days per week: Not on file    Minutes per session: Not on file  . Stress: Not on file  Relationships  . Social connections:    Talks on phone: Not on file    Gets together: Not on file    Attends religious service: Not on file    Active member of club or organization: Not on file    Attends meetings of clubs or organizations: Not on file    Relationship status: Not on file  . Intimate partner violence:    Fear of current or ex partner: Not on file  Emotionally abused: Not on file    Physically abused: Not on file    Forced sexual activity: Not on file  Other Topics Concern  . Not on file  Social History Narrative  . Not on file   Review of Systems  Constitutional: Negative for fatigue and unexpected weight change.       Wears seat belt  HENT: Negative for dental problem and tinnitus.        Husband thinks her hearing is not great--considering evaluation Keeps up with dentist  Eyes: Negative for visual disturbance.       No diplopia or unilateral vision loss  Respiratory: Negative for cough, chest tightness and shortness of breath.        Some wheezing if she doesn't take the zyrtec---no SOB  Cardiovascular: Positive for leg swelling. Negative for chest pain.       Still gets occasional flutter---at rest  Gastrointestinal: Negative for abdominal pain, blood in stool and constipation.       No heartburn  Endocrine: Negative for polydipsia and polyuria.  Genitourinary: Negative for difficulty urinating, dysuria and hematuria.       Mild dyspareunia---uses lubricant    Musculoskeletal: Negative for arthralgias, back pain and joint swelling.  Skin: Negative for rash.       Dry skin  Allergic/Immunologic: Positive for environmental allergies. Negative for immunocompromised state.       Uses 2 zyrtec at night for skin  Neurological: Negative for dizziness, syncope and light-headedness.       Occ migraine with weather change  Hematological: Negative for adenopathy. Bruises/bleeds easily.  Psychiatric/Behavioral: Negative for dysphoric mood and sleep disturbance. The patient is not nervous/anxious.        Objective:   Physical Exam  Constitutional: She is oriented to person, place, and time. She appears well-developed. No distress.  HENT:  Head: Normocephalic and atraumatic.  Right Ear: External ear normal.  Left Ear: External ear normal.  Mouth/Throat: Oropharynx is clear and moist. No oropharyngeal exudate.  Eyes: Pupils are equal, round, and reactive to light. Conjunctivae are normal.  Neck: Normal range of motion. No thyromegaly present.  Cardiovascular: Normal rate, regular rhythm, normal heart sounds and intact distal pulses. Exam reveals no gallop.  No murmur heard. Respiratory: Effort normal and breath sounds normal. No respiratory distress. She has no wheezes. She has no rales.  GI: Soft. There is no tenderness.  Musculoskeletal: She exhibits no edema or tenderness.  Lymphadenopathy:    She has no cervical adenopathy.  Neurological: She is alert and oriented to person, place, and time.  Skin: No rash noted. No erythema.  Psychiatric: She has a normal mood and affect. Her behavior is normal.           Assessment & Plan:

## 2017-12-03 LAB — SPECIMEN STATUS REPORT

## 2017-12-05 ENCOUNTER — Encounter: Payer: Self-pay | Admitting: Internal Medicine

## 2017-12-05 LAB — COMPREHENSIVE METABOLIC PANEL
ALT: 33 IU/L — AB (ref 0–32)
AST: 19 IU/L (ref 0–40)
Albumin/Globulin Ratio: 1.5 (ref 1.2–2.2)
Albumin: 4.4 g/dL (ref 3.6–4.8)
Alkaline Phosphatase: 72 IU/L (ref 39–117)
BUN/Creatinine Ratio: 16 (ref 12–28)
BUN: 11 mg/dL (ref 8–27)
Bilirubin Total: <0.2 mg/dL (ref 0.0–1.2)
CALCIUM: 9.3 mg/dL (ref 8.7–10.3)
CO2: 20 mmol/L (ref 20–29)
CREATININE: 0.68 mg/dL (ref 0.57–1.00)
Chloride: 104 mmol/L (ref 96–106)
GFR calc Af Amer: 109 mL/min/{1.73_m2} (ref >59)
GFR calc non Af Amer: 95 mL/min/{1.73_m2} (ref >59)
Globulin, Total: 2.9 g/dL (ref 1.5–4.5)
Glucose: 100 mg/dL — ABNORMAL HIGH (ref 65–99)
Potassium: 4.6 mmol/L (ref 3.5–5.2)
Sodium: 142 mmol/L (ref 134–144)
Total Protein: 7.3 g/dL (ref 6.0–8.5)

## 2017-12-05 LAB — CBC
HEMATOCRIT: 41.3 % (ref 34.0–46.6)
HEMOGLOBIN: 14 g/dL (ref 11.1–15.9)
MCH: 30.3 pg (ref 26.6–33.0)
MCHC: 33.9 g/dL (ref 31.5–35.7)
MCV: 89 fL (ref 79–97)
Platelets: 299 10*3/uL (ref 150–450)
RBC: 4.62 x10E6/uL (ref 3.77–5.28)
RDW: 14.5 % (ref 12.3–15.4)
WBC: 8 10*3/uL (ref 3.4–10.8)

## 2017-12-05 LAB — HEMOGLOBIN A1C
Est. average glucose Bld gHb Est-mCnc: 117 mg/dL
HEMOGLOBIN A1C: 5.7 % — AB (ref 4.8–5.6)

## 2017-12-30 MED ORDER — METFORMIN HCL ER 500 MG PO TB24: 500.0000 mg | ORAL_TABLET | Freq: Every day | ORAL | 3 refills | Status: DC

## 2018-03-27 ENCOUNTER — Telehealth: Payer: Self-pay | Admitting: Internal Medicine

## 2018-03-27 NOTE — Telephone Encounter (Signed)
Patient has cpx appointment scheduled with Dr.Letvak on 12/04/18.  Dr.Letvak will be out of the office on 12/04/18. I left a detailed message on patient's home and cell phone voice mail for patient to call back and r/s appointment.

## 2018-06-04 DIAGNOSIS — L239 Allergic contact dermatitis, unspecified cause: Secondary | ICD-10-CM | POA: Diagnosis not present

## 2018-06-04 DIAGNOSIS — L309 Dermatitis, unspecified: Secondary | ICD-10-CM | POA: Diagnosis not present

## 2018-08-31 ENCOUNTER — Encounter: Payer: Self-pay | Admitting: Internal Medicine

## 2018-11-05 DIAGNOSIS — Z461 Encounter for fitting and adjustment of hearing aid: Secondary | ICD-10-CM | POA: Diagnosis not present

## 2018-12-04 ENCOUNTER — Encounter: Payer: Managed Care, Other (non HMO) | Admitting: Internal Medicine

## 2018-12-07 ENCOUNTER — Encounter: Payer: Self-pay | Admitting: Internal Medicine

## 2018-12-07 ENCOUNTER — Other Ambulatory Visit: Payer: Self-pay

## 2018-12-07 ENCOUNTER — Ambulatory Visit (INDEPENDENT_AMBULATORY_CARE_PROVIDER_SITE_OTHER): Payer: BC Managed Care – PPO | Admitting: Internal Medicine

## 2018-12-07 VITALS — BP 122/80 | HR 76 | Temp 98.1°F | Ht 63.0 in | Wt 223.0 lb

## 2018-12-07 DIAGNOSIS — E669 Obesity, unspecified: Secondary | ICD-10-CM | POA: Diagnosis not present

## 2018-12-07 DIAGNOSIS — Z0000 Encounter for general adult medical examination without abnormal findings: Secondary | ICD-10-CM

## 2018-12-07 DIAGNOSIS — R7303 Prediabetes: Secondary | ICD-10-CM

## 2018-12-07 NOTE — Assessment & Plan Note (Signed)
Has really let herself go--discussed fitness, etc Colon was due in March---will work on getting that set up Mammogram due by October Pap due next year Flu vaccine in the fall

## 2018-12-07 NOTE — Assessment & Plan Note (Signed)
Urged her to do better with her lifestyle May consider restarting the metformin

## 2018-12-07 NOTE — Progress Notes (Signed)
Subjective:    Patient ID: Mercedes White, female    DOB: June 06, 1955, 63 y.o.   MRN: [id number]  HPI Here for physical  She is working from home since March 16th Has gained 25# from last year She knows it has been building over time---now sitting all day Eating more since she is at home  Did try metformin--went on recall and she didn't feel like it did any good  Current Outpatient Medications on File Prior to Visit  Medication Sig Dispense Refill  . aspirin 81 MG tablet Take 81 mg by mouth daily.    . Biotin w/ Vitamins C & E (HAIR/SKIN/NAILS PO) Take by mouth.    . cetirizine (ZYRTEC) 10 MG tablet Take 20 mg by mouth daily.     . Magnesium 400 MG TABS Take 1 tablet by mouth.    . Multiple Vitamin (MULTIVITAMIN) tablet Take 1 tablet by mouth daily.      . Zinc 50 MG CAPS Take by mouth daily. Reported on 11/14/2015     No current facility-administered medications on file prior to visit.     Allergies  Allergen Reactions  . Cephalexin     REACTION: rash    Past Medical History:  Diagnosis Date  . Allergy   . Migraines    with menses    Past Surgical History:  Procedure Laterality Date  . CESAREAN SECTION     x1  . VAGINAL DELIVERY     x1    Family History  Problem Relation Age of Onset  . Arthritis Mother   . Cancer Mother 35       Breast cancer  . Breast cancer Mother 57  . Diabetes Neg Hx   . Hypertension Neg Hx     Social History   Socioeconomic History  . Marital status: Married    Spouse name: Not on file  . Number of children: 2  . Years of education: Not on file  . Highest education level: Not on file  Occupational History  . 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 Financial resource strain: Not on file  . Food insecurity    Worry: Not on file    Inability: Not on file  . Transportation needs    Medical: Not on file    Non-medical: Not on file  Tobacco Use  . Smoking status: Never Smoker  . Smokeless  tobacco: Never Used  Substance and Sexual Activity  . Alcohol use: Yes  . Drug use: No  . Sexual activity: Not on file  Lifestyle  . Physical activity    Days per week: Not on file    Minutes per session: Not on file  . Stress: Not on file  Relationships  . Social Herbalist on phone: Not on file    Gets together: Not on file    Attends religious service: Not on file    Active member of club or organization: Not on file    Attends meetings of clubs or organizations: Not on file    Relationship status: Not on file  . Intimate partner violence    Fear of current or ex partner: Not on file    Emotionally abused: Not on file    Physically abused: Not on file    Forced sexual activity: Not on file  Other Topics Concern  . Not on file  Social History Narrative  . Not on file  Review of Systems  Constitutional: Positive for unexpected weight change. Negative for fatigue.       Wears seat belt  HENT: Negative for hearing loss and tinnitus.        Has had some recent crowns  Eyes: Negative for visual disturbance.       No diplopia or unilateral vision loss  Respiratory: Negative for chest tightness and shortness of breath.        Cough only with allergies  Cardiovascular: Negative for chest pain and palpitations.       Slight leg swelling--end of day only  Gastrointestinal: Negative for abdominal pain, blood in stool and constipation.       No heartburn   Endocrine: Negative for polydipsia and polyuria.  Genitourinary: Negative for difficulty urinating, dyspareunia, dysuria and hematuria.  Musculoskeletal: Negative for arthralgias, back pain and joint swelling.  Skin: Negative for rash.       Doing better recently  Allergic/Immunologic: Positive for environmental allergies. Negative for immunocompromised state.       Zyrtec daily controls symptoms  Neurological: Negative for dizziness, syncope and light-headedness.       Occ pressure headaches (with weather  changes)--tylenol helps  Hematological: Negative for adenopathy. Does not bruise/bleed easily.  Psychiatric/Behavioral: Negative for dysphoric mood and sleep disturbance. The patient is not nervous/anxious.        Objective:   Physical Exam  Constitutional: She is oriented to person, place, and time. She appears well-developed. No distress.  HENT:  Head: Normocephalic and atraumatic.  Right Ear: External ear normal.  Left Ear: External ear normal.  Mouth/Throat: Oropharynx is clear and moist. No oropharyngeal exudate.  Eyes: Pupils are equal, round, and reactive to light. Conjunctivae are normal.  Neck: No thyromegaly present.  Cardiovascular: Normal rate, regular rhythm, normal heart sounds and intact distal pulses. Exam reveals no gallop.  No murmur heard. Respiratory: Effort normal and breath sounds normal. No respiratory distress. She has no wheezes. She has no rales.  GI: Soft. There is no abdominal tenderness.  Musculoskeletal:        General: No tenderness or edema.  Lymphadenopathy:    She has no cervical adenopathy.  Neurological: She is alert and oriented to person, place, and time.  Skin: No rash noted. No erythema.  Psychiatric: She has a normal mood and affect. Her behavior is normal.           Assessment & Plan:

## 2018-12-07 NOTE — Assessment & Plan Note (Signed)
Discussed DASH eating and exercise

## 2018-12-07 NOTE — Patient Instructions (Addendum)
Please set up your screening mammogram.  DASH Eating Plan DASH stands for "Dietary Approaches to Stop Hypertension." The DASH eating plan is a healthy eating plan that has been shown to reduce high blood pressure (hypertension). It may also reduce your risk for type 2 diabetes, heart disease, and stroke. The DASH eating plan may also help with weight loss. What are tips for following this plan?  General guidelines  Avoid eating more than 2,300 mg (milligrams) of salt (sodium) a day. If you have hypertension, you may need to reduce your sodium intake to 1,500 mg a day.  Limit alcohol intake to no more than 1 drink a day for nonpregnant women and 2 drinks a day for men. One drink equals 12 oz of beer, 5 oz of wine, or 1 oz of hard liquor.  Work with your health care provider to maintain a healthy body weight or to lose weight. Ask what an ideal weight is for you.  Get at least 30 minutes of exercise that causes your heart to beat faster (aerobic exercise) most days of the week. Activities may include walking, swimming, or biking.  Work with your health care provider or diet and nutrition specialist (dietitian) to adjust your eating plan to your individual calorie needs. Reading food labels   Check food labels for the amount of sodium per serving. Choose foods with less than 5 percent of the Daily Value of sodium. Generally, foods with less than 300 mg of sodium per serving fit into this eating plan.  To find whole grains, look for the word "whole" as the first word in the ingredient list. Shopping  Buy products labeled as "low-sodium" or "no salt added."  Buy fresh foods. Avoid canned foods and premade or frozen meals. Cooking  Avoid adding salt when cooking. Use salt-free seasonings or herbs instead of table salt or sea salt. Check with your health care provider or pharmacist before using salt substitutes.  Do not fry foods. Cook foods using healthy methods such as baking, boiling,  grilling, and broiling instead.  Cook with heart-healthy oils, such as olive, canola, soybean, or sunflower oil. Meal planning  Eat a balanced diet that includes: ? 5 or more servings of fruits and vegetables each day. At each meal, try to fill half of your plate with fruits and vegetables. ? Up to 6-8 servings of whole grains each day. ? Less than 6 oz of lean meat, poultry, or fish each day. A 3-oz serving of meat is about the same size as a deck of cards. One egg equals 1 oz. ? 2 servings of low-fat dairy each day. ? A serving of nuts, seeds, or beans 5 times each week. ? Heart-healthy fats. Healthy fats called Omega-3 fatty acids are found in foods such as flaxseeds and coldwater fish, like sardines, salmon, and mackerel.  Limit how much you eat of the following: ? Canned or prepackaged foods. ? Food that is high in trans fat, such as fried foods. ? Food that is high in saturated fat, such as fatty meat. ? Sweets, desserts, sugary drinks, and other foods with added sugar. ? Full-fat dairy products.  Do not salt foods before eating.  Try to eat at least 2 vegetarian meals each week.  Eat more home-cooked food and less restaurant, buffet, and fast food.  When eating at a restaurant, ask that your food be prepared with less salt or no salt, if possible. What foods are recommended? The items listed may not be a  complete list. Talk with your dietitian about what dietary choices are best for you. Grains Whole-grain or whole-wheat bread. Whole-grain or whole-wheat pasta. Brown rice. [place]. Bulgur. Whole-grain and low-sodium cereals. Pita bread. Low-fat, low-sodium crackers. Whole-wheat flour tortillas. Vegetables Fresh or frozen vegetables (raw, steamed, roasted, or grilled). Low-sodium or reduced-sodium tomato and vegetable juice. Low-sodium or reduced-sodium tomato sauce and tomato paste. Low-sodium or reduced-sodium canned vegetables. Fruits All fresh, dried, or frozen  fruit. Canned fruit in natural juice (without added sugar). Meat and other protein foods Skinless chicken or [place]. Ground chicken or [place]. Pork with fat trimmed off. Fish and seafood. Egg whites. Dried beans, peas, or lentils. Unsalted nuts, nut butters, and seeds. Unsalted canned beans. Lean cuts of beef with fat trimmed off. Low-sodium, lean deli meat. Dairy Low-fat (1%) or fat-free (skim) milk. Fat-free, low-fat, or reduced-fat cheeses. Nonfat, low-sodium ricotta or cottage cheese. Low-fat or nonfat yogurt. Low-fat, low-sodium cheese. Fats and oils Soft margarine without trans fats. Vegetable oil. Low-fat, reduced-fat, or light mayonnaise and salad dressings (reduced-sodium). Canola, safflower, olive, soybean, and sunflower oils. Avocado. Seasoning and other foods Herbs. Spices. Seasoning mixes without salt. Unsalted popcorn and pretzels. Fat-free sweets. What foods are not recommended? The items listed may not be a complete list. Talk with your dietitian about what dietary choices are best for you. Grains Baked goods made with fat, such as croissants, muffins, or some breads. Dry pasta or rice meal packs. Vegetables Creamed or fried vegetables. Vegetables in a cheese sauce. Regular canned vegetables (not low-sodium or reduced-sodium). Regular canned tomato sauce and paste (not low-sodium or reduced-sodium). Regular tomato and vegetable juice (not low-sodium or reduced-sodium). Angie Fava. Olives. Fruits Canned fruit in a light or heavy syrup. Fried fruit. Fruit in cream or butter sauce. Meat and other protein foods Fatty cuts of meat. Ribs. Fried meat. Berniece Salines. Sausage. Bologna and other processed lunch meats. Salami. Fatback. Hotdogs. Bratwurst. Salted nuts and seeds. Canned beans with added salt. Canned or smoked fish. Whole eggs or egg yolks. Chicken or [place] with skin. Dairy Whole or 2% milk, cream, and half-and-half. Whole or full-fat cream cheese. Whole-fat or sweetened yogurt. Full-fat  cheese. Nondairy creamers. Whipped toppings. Processed cheese and cheese spreads. Fats and oils Butter. Stick margarine. Lard. Shortening. Ghee. Bacon fat. Tropical oils, such as coconut, palm kernel, or palm oil. Seasoning and other foods Salted popcorn and pretzels. Onion salt, garlic salt, seasoned salt, table salt, and sea salt. Worcestershire sauce. Tartar sauce. Barbecue sauce. Teriyaki sauce. Soy sauce, including reduced-sodium. Steak sauce. Canned and packaged gravies. Fish sauce. Oyster sauce. Cocktail sauce. Horseradish that you find on the shelf. Ketchup. Mustard. Meat flavorings and tenderizers. Bouillon cubes. Hot sauce and Tabasco sauce. Premade or packaged marinades. Premade or packaged taco seasonings. Relishes. Regular salad dressings. Where to find more information:  National Heart, Lung, and [place]: https://wilson-eaton.com/  American Heart Association: www.heart.org Summary  The DASH eating plan is a healthy eating plan that has been shown to reduce high blood pressure (hypertension). It may also reduce your risk for type 2 diabetes, heart disease, and stroke.  With the DASH eating plan, you should limit salt (sodium) intake to 2,300 mg a day. If you have hypertension, you may need to reduce your sodium intake to 1,500 mg a day.  When on the DASH eating plan, aim to eat more fresh fruits and vegetables, whole grains, lean proteins, low-fat dairy, and heart-healthy fats.  Work with your health care provider or diet and nutrition specialist (dietitian)  to adjust your eating plan to your individual calorie needs. This information is not intended to replace advice given to you by your health care provider. Make sure you discuss any questions you have with your health care provider. Document Released: 04/11/2011 Document Revised: 04/04/2017 Document Reviewed: 04/15/2016 Elsevier Patient Education  2020 [employer].

## 2018-12-08 LAB — COMPREHENSIVE METABOLIC PANEL
ALT: 26 IU/L (ref 0–32)
AST: 17 IU/L (ref 0–40)
Albumin/Globulin Ratio: 1.5 (ref 1.2–2.2)
Albumin: 4.4 g/dL (ref 3.8–4.8)
Alkaline Phosphatase: 75 IU/L (ref 39–117)
BUN/Creatinine Ratio: 21 (ref 12–28)
BUN: 15 mg/dL (ref 8–27)
Bilirubin Total: <0.2 mg/dL (ref 0.0–1.2)
CO2: 22 mmol/L (ref 20–29)
Calcium: 9.8 mg/dL (ref 8.7–10.3)
Chloride: 103 mmol/L (ref 96–106)
Creatinine, Ser: 0.72 mg/dL (ref 0.57–1.00)
GFR calc Af Amer: 104 mL/min/{1.73_m2} (ref >59)
GFR calc non Af Amer: 90 mL/min/{1.73_m2} (ref >59)
Globulin, Total: 2.9 g/dL (ref 1.5–4.5)
Glucose: 103 mg/dL — ABNORMAL HIGH (ref 65–99)
Potassium: 4.4 mmol/L (ref 3.5–5.2)
Sodium: 141 mmol/L (ref 134–144)
Total Protein: 7.3 g/dL (ref 6.0–8.5)

## 2018-12-08 LAB — LIPID PANEL
Chol/HDL Ratio: 3.3 ratio (ref 0.0–4.4)
Cholesterol, Total: 205 mg/dL — ABNORMAL HIGH (ref 100–199)
HDL: 62 mg/dL (ref >39)
LDL Calculated: 106 mg/dL — ABNORMAL HIGH (ref 0–99)
Triglycerides: 184 mg/dL — ABNORMAL HIGH (ref 0–149)
VLDL Cholesterol Cal: 37 mg/dL (ref 5–40)

## 2018-12-08 LAB — CBC
Hematocrit: 38.6 % (ref 34.0–46.6)
Hemoglobin: 12.9 g/dL (ref 11.1–15.9)
MCH: 29.2 pg (ref 26.6–33.0)
MCHC: 33.4 g/dL (ref 31.5–35.7)
MCV: 87 fL (ref 79–97)
Platelets: 343 10*3/uL (ref 150–450)
RBC: 4.42 x10E6/uL (ref 3.77–5.28)
RDW: 13.1 % (ref 11.7–15.4)
WBC: 8.6 10*3/uL (ref 3.4–10.8)

## 2018-12-08 LAB — HEMOGLOBIN A1C
Est. average glucose Bld gHb Est-mCnc: 114 mg/dL
Hgb A1c MFr Bld: 5.6 % (ref 4.8–5.6)

## 2019-01-05 NOTE — Progress Notes (Signed)
Barbie Haggis,  Please call Ms. Trost and ask her to schedule previsit and screening colonoscopy  Thanks  Dr. Darnell Level

## 2019-01-06 ENCOUNTER — Telehealth: Payer: Self-pay | Admitting: Internal Medicine

## 2019-01-06 NOTE — Telephone Encounter (Signed)
Left a voicemail to call back and schedule recall colonoscopy and previsit.

## 2019-01-18 ENCOUNTER — Encounter: Payer: Self-pay | Admitting: Internal Medicine

## 2019-01-28 ENCOUNTER — Encounter: Payer: Self-pay | Admitting: Internal Medicine

## 2019-01-28 ENCOUNTER — Ambulatory Visit (AMBULATORY_SURGERY_CENTER): Payer: Self-pay | Admitting: *Deleted

## 2019-01-28 ENCOUNTER — Other Ambulatory Visit: Payer: Self-pay

## 2019-01-28 VITALS — Temp 96.8°F | Ht 63.0 in | Wt 216.0 lb

## 2019-01-28 DIAGNOSIS — Z1211 Encounter for screening for malignant neoplasm of colon: Secondary | ICD-10-CM

## 2019-01-28 NOTE — Progress Notes (Signed)
No egg or soy allergy known to patient  No issues with past sedation with any surgeries  or procedures, no past  intubation  No diet pills per patient No home 02 use per patient  No blood thinners per patient  Pt denies issues with constipation  No A fib or A flutter  EMMI video sent to pt's e mail   Due to the COVID-19 pandemic we are asking patients to follow these guidelines. Please only bring one care partner. Please be aware that your care partner may wait in the car in the parking lot or if they feel like they will be too hot to wait in the car, they may wait in the lobby on the 4th floor. All care partners are required to wear a mask the entire time (we do not have any that we can provide them), they need to practice social distancing, and we will do a Covid check for all patient's and care partners when you arrive. Also we will check their temperature and your temperature. If the care partner waits in their car they need to stay in the parking lot the entire time and we will call them on their cell phone when the patient is ready for discharge so they can bring the car to the front of the building. Also all patient's will need to wear a mask into building.

## 2019-02-08 ENCOUNTER — Telehealth: Payer: Self-pay | Admitting: Internal Medicine

## 2019-02-08 NOTE — Telephone Encounter (Signed)

## 2019-02-09 ENCOUNTER — Encounter: Payer: Self-pay | Admitting: Internal Medicine

## 2019-02-09 ENCOUNTER — Other Ambulatory Visit: Payer: Self-pay | Admitting: Internal Medicine

## 2019-02-09 ENCOUNTER — Other Ambulatory Visit: Payer: Self-pay

## 2019-02-09 ENCOUNTER — Ambulatory Visit (AMBULATORY_SURGERY_CENTER): Payer: BC Managed Care – PPO | Admitting: Internal Medicine

## 2019-02-09 VITALS — BP 123/76 | HR 65 | Temp 97.9°F | Resp 15 | Ht 63.0 in | Wt 216.0 lb

## 2019-02-09 DIAGNOSIS — Z1211 Encounter for screening for malignant neoplasm of colon: Secondary | ICD-10-CM

## 2019-02-09 DIAGNOSIS — D125 Benign neoplasm of sigmoid colon: Secondary | ICD-10-CM

## 2019-02-09 MED ORDER — SODIUM CHLORIDE 0.9 % IV SOLN: 500.0000 mL | Freq: Once | INTRAVENOUS | Status: DC

## 2019-02-09 NOTE — Progress Notes (Signed)
JB- Temp CW- Vitals 

## 2019-02-09 NOTE — Op Note (Signed)
[facility] Patient Name: Mercedes White Procedure Date: 02/09/2019 11:54 AM MRN: [id number] Endoscopist: Gatha Mayer , MD Age: 63 Referring MD:  Date of Birth: 1956-04-06 Gender: Female Account #: [id number] Procedure:                Colonoscopy Indications:              Screening for colorectal malignant neoplasm Medicines:                Propofol per Anesthesia, Monitored Anesthesia Care Procedure:                Pre-Anesthesia Assessment:                           - Prior to the procedure, a History and Physical                            was performed, and patient medications and                            allergies were reviewed. The patient's tolerance of                            previous anesthesia was also reviewed. The risks                            and benefits of the procedure and the sedation                            options and risks were discussed with the patient.                            All questions were answered, and informed consent                            was obtained. Prior Anticoagulants: The patient has                            taken no previous anticoagulant or antiplatelet                            agents. ASA Grade Assessment: II - A patient with                            mild systemic disease. After reviewing the risks                            and benefits, the patient was deemed in                            satisfactory condition to undergo the procedure.                           After obtaining informed consent, the colonoscope  was passed under direct vision. Throughout the                            procedure, the patient's blood pressure, pulse, and                            oxygen saturations were monitored continuously. The                            Colonoscope was introduced through the anus and                            advanced to the the cecum, identified by    appendiceal orifice and ileocecal valve. The                            colonoscopy was performed without difficulty. The                            patient tolerated the procedure well. The quality                            of the bowel preparation was excellent. The                            ileocecal valve, appendiceal orifice, and rectum                            were photographed. The bowel preparation used was                            Miralax via split dose instruction. Scope In: 12:12:12 PM Scope Out: 12:30:45 PM Scope Withdrawal Time: 0 hours 14 minutes 44 seconds  Total Procedure Duration: 0 hours 18 minutes 33 seconds  Findings:                 The perianal and digital rectal examinations were                            normal.                           Two sessile polyps were found in the sigmoid colon.                            The polyps were diminutive in size. These polyps                            were removed with a cold snare. Resection and                            retrieval were complete. Verification of patient                            identification for the specimen  was done. Estimated                            blood loss was minimal.                           Multiple diverticula were found in the sigmoid                            colon.                           The exam was otherwise without abnormality on                            direct and retroflexion views. Complications:            No immediate complications. Estimated Blood Loss:     Estimated blood loss was minimal. Impression:               - Two diminutive polyps in the sigmoid colon,                            removed with a cold snare. Resected and retrieved.                           - Diverticulosis in the sigmoid colon.                           - The examination was otherwise normal on direct                            and retroflexion views. Recommendation:           - Patient has  a contact number available for                            emergencies. The signs and symptoms of potential                            delayed complications were discussed with the                            patient. Return to normal activities tomorrow.                            Written discharge instructions were provided to the                            patient.                           - Resume previous diet.                           - Continue present medications.                           -  Repeat colonoscopy is recommended. The                            colonoscopy date will be determined after pathology                            results from today's exam become available for                            review. Gatha Mayer, MD 02/09/2019 12:39:58 PM This report has been signed electronically.

## 2019-02-09 NOTE — Progress Notes (Signed)
A/ox3, pleased with MAC, report to RN 

## 2019-02-09 NOTE — Patient Instructions (Addendum)
I removed 2 tiny polyps and saw diverticulosis.  I will let you know pathology results and when to have another routine colonoscopy by mail and/or My Chart.  I appreciate the opportunity to care for you. Gatha Mayer, MD, FACG  YOU HAD AN ENDOSCOPIC PROCEDURE TODAY AT [facility]:   Refer to the procedure report that was given to you for any specific questions about what was found during the examination.  If the procedure report does not answer your questions, please call your gastroenterologist to clarify.  If you requested that your care partner not be given the details of your procedure findings, then the procedure report has been included in a sealed envelope for you to review at your convenience later.  YOU SHOULD EXPECT: Some feelings of bloating in the abdomen. Passage of more gas than usual.  Walking can help get rid of the air that was put into your GI tract during the procedure and reduce the bloating. If you had a lower endoscopy (such as a colonoscopy or flexible sigmoidoscopy) you may notice spotting of blood in your stool or on the toilet paper. If you underwent a bowel prep for your procedure, you may not have a normal bowel movement for a few days.  Please Note:  You might notice some irritation and congestion in your nose or some drainage.  This is from the oxygen used during your procedure.  There is no need for concern and it should clear up in a day or so.  SYMPTOMS TO REPORT IMMEDIATELY:   Following lower endoscopy (colonoscopy or flexible sigmoidoscopy):  Excessive amounts of blood in the stool  Significant tenderness or worsening of abdominal pains  Swelling of the abdomen that is new, acute  Fever of 100F or higher   For urgent or emergent issues, a gastroenterologist can be reached at any hour by calling [phone].   DIET:  We do recommend a small meal at first, but then you may proceed to your regular diet.  Drink plenty of fluids but you  should avoid alcoholic beverages for 24 hours.  ACTIVITY:  You should plan to take it easy for the rest of today and you should NOT DRIVE or use heavy machinery until tomorrow (because of the sedation medicines used during the test).    FOLLOW UP: Our staff will call the number listed on your records 48-72 hours following your procedure to check on you and address any questions or concerns that you may have regarding the information given to you following your procedure. If we do not reach you, we will leave a message.  We will attempt to reach you two times.  During this call, we will ask if you have developed any symptoms of COVID 19. If you develop any symptoms (ie: fever, flu-like symptoms, shortness of breath, cough etc.) before then, please call [phone].  If you test positive for Covid 19 in the 2 weeks post procedure, please call and report this information to [place].    If any biopsies were taken you will be contacted by phone or by letter within the next 1-3 weeks.  Please call us at [phone] if you have not heard about the biopsies in 3 weeks.    SIGNATURES/CONFIDENTIALITY: You and/or your care partner have signed paperwork which will be entered into your electronic medical record.  These signatures attest to the fact that that the information above on your After Visit Summary has been reviewed and is understood.  Full responsibility of the confidentiality of this discharge information lies with you and/or your care-partner.

## 2019-02-09 NOTE — Progress Notes (Signed)
Called to room to assist during endoscopic procedure.  Patient ID and intended procedure confirmed with present staff. Received instructions for my participation in the procedure from the performing physician.  

## 2019-02-09 NOTE — Progress Notes (Signed)
Pt's states no medical or surgical changes since previsit or office visit. 

## 2019-02-11 ENCOUNTER — Telehealth: Payer: Self-pay

## 2019-02-11 NOTE — Telephone Encounter (Signed)
  Follow up Call-  Call back number 02/09/2019  Post procedure Call Back phone  # [phone]  Permission to leave phone message Yes  Some recent data might be hidden     Patient questions:  Do you have a fever, pain , or abdominal swelling? No. Pain Score  0 *  Have you tolerated food without any problems? Yes.    Have you been able to return to your normal activities? Yes.    Do you have any questions about your discharge instructions: Diet   No. Medications  No. Follow up visit  No.  Do you have questions or concerns about your Care? No.  Actions: * If pain score is 4 or above: No action needed, pain <4. 1. Have you developed a fever since your procedure? no  2.   Have you had an respiratory symptoms (SOB or cough) since your procedure? no  3.   Have you tested positive for COVID 19 since your procedure no  4.   Have you had any family members/close contacts diagnosed with the COVID 19 since your procedure?  no   If yes to any of these questions please route to Joylene John, RN and Alphonsa Gin, Therapist, sports.

## 2019-02-17 ENCOUNTER — Encounter: Payer: Self-pay | Admitting: Internal Medicine

## 2019-02-17 DIAGNOSIS — Z860101 Personal history of adenomatous and serrated colon polyps: Secondary | ICD-10-CM | POA: Insufficient documentation

## 2019-02-17 DIAGNOSIS — Z86010 Personal history of colonic polyps: Secondary | ICD-10-CM

## 2019-02-17 HISTORY — DX: Personal history of adenomatous and serrated colon polyps: Z86.0101

## 2019-02-17 HISTORY — DX: Personal history of colonic polyps: Z86.010

## 2019-12-10 ENCOUNTER — Ambulatory Visit (INDEPENDENT_AMBULATORY_CARE_PROVIDER_SITE_OTHER): Payer: BC Managed Care – PPO | Admitting: Internal Medicine

## 2019-12-10 ENCOUNTER — Other Ambulatory Visit: Payer: Self-pay

## 2019-12-10 ENCOUNTER — Encounter: Payer: Self-pay | Admitting: Internal Medicine

## 2019-12-10 VITALS — BP 110/70 | HR 68 | Temp 97.6°F | Ht 63.0 in | Wt 202.0 lb

## 2019-12-10 DIAGNOSIS — Z0000 Encounter for general adult medical examination without abnormal findings: Secondary | ICD-10-CM

## 2019-12-10 DIAGNOSIS — Z23 Encounter for immunization: Secondary | ICD-10-CM | POA: Diagnosis not present

## 2019-12-10 DIAGNOSIS — J301 Allergic rhinitis due to pollen: Secondary | ICD-10-CM | POA: Diagnosis not present

## 2019-12-10 DIAGNOSIS — R7303 Prediabetes: Secondary | ICD-10-CM

## 2019-12-10 NOTE — Patient Instructions (Signed)
Please set up your screening mammogram. 

## 2019-12-10 NOTE — Addendum Note (Signed)
Addended by: Pilar Grammes on: 12/10/2019 08:39 AM   Modules accepted: Orders

## 2019-12-10 NOTE — Assessment & Plan Note (Signed)
Has lost some weight Discussed adding exercise Overdue for mammogram Pap done today---should probably have one last one in 3 years shingrix today Flu vaccine in the fall Colon due again 2027

## 2019-12-10 NOTE — Assessment & Plan Note (Signed)
Does well with prn zyrtec

## 2019-12-10 NOTE — Progress Notes (Signed)
Subjective:    Patient ID: Mercedes White, female    DOB: 07-05-1955, 63 y.o.   MRN: [id number]  HPI Here for physical This visit occurred during the SARS-CoV-2 public health emergency.  Safety protocols were in place, including screening questions prior to the visit, additional usage of staff PPE, and extensive cleaning of exam room while observing appropriate contact time as indicated for disinfecting solutions.   Doing well Has lost some of the weight Working from home Eating better---but no exercise  Uses zyrtec prn--no other regular meds  Current Outpatient Medications on File Prior to Visit  Medication Sig Dispense Refill  . cetirizine (ZYRTEC) 10 MG tablet Take 20 mg by mouth daily.     . Multiple Vitamin (MULTIVITAMIN) tablet Take 1 tablet by mouth daily. Centrum Silver (Patient not taking: Reported on 12/10/2019)     No current facility-administered medications on file prior to visit.    Allergies  Allergen Reactions  . Cephalexin     REACTION: rash    Past Medical History:  Diagnosis Date  . Allergy   . Arthritis    hands   . Diverticulosis   . Hx of adenomatous polyp of colon 02/17/2019  . Migraines    with menses    Past Surgical History:  Procedure Laterality Date  . CESAREAN SECTION     x1  . COLONOSCOPY  2010  . WISDOM TOOTH EXTRACTION      Family History  Problem Relation Age of Onset  . Arthritis Mother   . Cancer Mother 17       Breast cancer  . Breast cancer Mother 15  . Diabetes Neg Hx   . Hypertension Neg Hx   . Colon cancer Neg Hx   . Colon polyps Neg Hx   . Esophageal cancer Neg Hx   . Rectal cancer Neg Hx   . Stomach cancer Neg Hx     Social History   Socioeconomic History  . Marital status: Married    Spouse name: Not on file  . Number of children: 2  . Years of education: Not on file  . Highest education level: Not on file  Occupational History  . Occupation: patient Sport and exercise psychologist: [employer]  Tobacco Use  .  Smoking status: Never Smoker  . Smokeless tobacco: Never Used  Substance and Sexual Activity  . Alcohol use: Yes    Comment: occasionally   . Drug use: No  . Sexual activity: Not on file  Other Topics Concern  . Not on file  Social History Narrative  . Not on file   Social Determinants of Health   Financial Resource Strain:   . Difficulty of Paying Living Expenses:   Food Insecurity:   . Worried About Charity fundraiser in the Last Year:   . Arboriculturist in the Last Year:   Transportation Needs:   . Film/video editor (Medical):   Marland Kitchen Lack of Transportation (Non-Medical):   Physical Activity:   . Days of Exercise per Week:   . Minutes of Exercise per Session:   Stress:   . Feeling of Stress :   Social Connections:   . Frequency of Communication with Friends and Family:   . Frequency of Social Gatherings with Friends and Family:   . Attends Religious Services:   . Active Member of Clubs or Organizations:   . Attends Archivist Meetings:   Marland Kitchen Marital Status:   Intimate Production manager  Violence:   . Fear of Current or Ex-Partner:   . Emotionally Abused:   Marland Kitchen Physically Abused:   . Sexually Abused:    Review of Systems  Constitutional: Negative for fatigue.       Has lost ~12# Wears seat belt  HENT: Negative for dental problem, hearing loss and tinnitus.        Keeps up with dentist  Eyes: Negative for visual disturbance.       No diplopia or unilateral vision loss  Respiratory: Negative for chest tightness and shortness of breath.        Sporadic cough Wheezes after laughing--brief  Cardiovascular: Negative for chest pain, palpitations and leg swelling.  Gastrointestinal: Negative for blood in stool and constipation.       No heartburn  Endocrine: Negative for polydipsia and polyuria.  Genitourinary: Negative for difficulty urinating and dysuria.       Uses lubricant for sex  Musculoskeletal: Negative for arthralgias, back pain and joint swelling.  Skin:  Negative for rash.       No suspicious lesions  Allergic/Immunologic: Positive for environmental allergies. Negative for immunocompromised state.  Neurological: Negative for dizziness, syncope and light-headedness.       Occasional headaches---mild  Hematological: Negative for adenopathy. Does not bruise/bleed easily.  Psychiatric/Behavioral: Negative for dysphoric mood and sleep disturbance. The patient is not nervous/anxious.        Objective:   Physical Exam Constitutional:      Appearance: Normal appearance.  HENT:     Head: Normocephalic and atraumatic.     Right Ear: Tympanic membrane and ear canal normal.     Left Ear: Tympanic membrane and ear canal normal.     Mouth/Throat:     Comments: No lesions Eyes:     Conjunctiva/sclera: Conjunctivae normal.     Pupils: Pupils are equal, round, and reactive to light.  Cardiovascular:     Rate and Rhythm: Normal rate and regular rhythm.     Pulses: Normal pulses.     Heart sounds: No murmur heard.  No gallop.   Pulmonary:     Effort: Pulmonary effort is normal.     Breath sounds: Normal breath sounds. No wheezing or rales.  Abdominal:     Palpations: Abdomen is soft.     Tenderness: There is no abdominal tenderness.  Genitourinary:    Comments: Normal introitus and cervix Pap done Musculoskeletal:     Cervical back: Neck supple.     Right lower leg: No edema.     Left lower leg: No edema.  Lymphadenopathy:     Cervical: No cervical adenopathy.  Skin:    Findings: No rash.  Neurological:     General: No focal deficit present.     Mental Status: She is alert and oriented to person, place, and time.  Psychiatric:        Mood and Affect: Mood normal.        Behavior: Behavior normal.            Assessment & Plan:

## 2019-12-10 NOTE — Assessment & Plan Note (Signed)
Was better last year Will recheck

## 2019-12-11 LAB — COMPREHENSIVE METABOLIC PANEL
ALT: 23 IU/L (ref 0–32)
AST: 16 IU/L (ref 0–40)
Albumin/Globulin Ratio: 1.8 (ref 1.2–2.2)
Albumin: 4.5 g/dL (ref 3.8–4.8)
Alkaline Phosphatase: 77 IU/L (ref 48–121)
BUN/Creatinine Ratio: 19 (ref 12–28)
BUN: 13 mg/dL (ref 8–27)
Bilirubin Total: 0.4 mg/dL (ref 0.0–1.2)
CO2: 25 mmol/L (ref 20–29)
Calcium: 9.5 mg/dL (ref 8.7–10.3)
Chloride: 103 mmol/L (ref 96–106)
Creatinine, Ser: 0.67 mg/dL (ref 0.57–1.00)
GFR calc Af Amer: 108 mL/min/{1.73_m2} (ref >59)
GFR calc non Af Amer: 94 mL/min/{1.73_m2} (ref >59)
Globulin, Total: 2.5 g/dL (ref 1.5–4.5)
Glucose: 104 mg/dL — ABNORMAL HIGH (ref 65–99)
Potassium: 5.2 mmol/L (ref 3.5–5.2)
Sodium: 141 mmol/L (ref 134–144)
Total Protein: 7 g/dL (ref 6.0–8.5)

## 2019-12-11 LAB — CBC
Hematocrit: 40.7 % (ref 34.0–46.6)
Hemoglobin: 13.5 g/dL (ref 11.1–15.9)
MCH: 29.9 pg (ref 26.6–33.0)
MCHC: 33.2 g/dL (ref 31.5–35.7)
MCV: 90 fL (ref 79–97)
Platelets: 306 10*3/uL (ref 150–450)
RBC: 4.51 x10E6/uL (ref 3.77–5.28)
RDW: 13.4 % (ref 11.7–15.4)
WBC: 6.7 10*3/uL (ref 3.4–10.8)

## 2019-12-11 LAB — HEMOGLOBIN A1C
Est. average glucose Bld gHb Est-mCnc: 117 mg/dL
Hgb A1c MFr Bld: 5.7 % — ABNORMAL HIGH (ref 4.8–5.6)

## 2019-12-13 LAB — PAP LB (LIQUID-BASED)

## 2020-02-09 ENCOUNTER — Other Ambulatory Visit: Payer: Self-pay

## 2020-02-09 ENCOUNTER — Ambulatory Visit (INDEPENDENT_AMBULATORY_CARE_PROVIDER_SITE_OTHER): Payer: BC Managed Care – PPO

## 2020-02-09 DIAGNOSIS — Z23 Encounter for immunization: Secondary | ICD-10-CM

## 2020-02-09 NOTE — Progress Notes (Signed)
Per orders of Dr. Viviana Simpler, injection of 2nd Shingrix given by Lurlean Nanny.  Patient tolerated injection well.

## 2020-05-09 ENCOUNTER — Other Ambulatory Visit: Payer: Self-pay | Admitting: Internal Medicine

## 2020-05-16 ENCOUNTER — Other Ambulatory Visit: Payer: Self-pay | Admitting: Family Medicine

## 2020-05-16 DIAGNOSIS — R059 Cough, unspecified: Secondary | ICD-10-CM

## 2020-05-16 DIAGNOSIS — J069 Acute upper respiratory infection, unspecified: Secondary | ICD-10-CM

## 2020-05-16 NOTE — Addendum Note (Signed)
Addended by: Wilburt Finlay on: 05/16/2020 01:48 PM   Modules accepted: Orders

## 2020-05-16 NOTE — Progress Notes (Signed)
Canceled order. Not Dr Rosanna Randy pt.

## 2020-11-10 ENCOUNTER — Other Ambulatory Visit: Payer: Self-pay | Admitting: Internal Medicine

## 2020-11-10 DIAGNOSIS — Z1231 Encounter for screening mammogram for malignant neoplasm of breast: Secondary | ICD-10-CM

## 2020-11-21 ENCOUNTER — Other Ambulatory Visit: Payer: Self-pay

## 2020-11-21 ENCOUNTER — Ambulatory Visit
Admission: RE | Admit: 2020-11-21 | Discharge: 2020-11-21 | Disposition: A | Discharge status: Home / Self Care | Payer: BC Managed Care – PPO | Source: Ambulatory Visit | Attending: Internal Medicine | Admitting: Internal Medicine

## 2020-11-21 DIAGNOSIS — Z1231 Encounter for screening mammogram for malignant neoplasm of breast: Secondary | ICD-10-CM | POA: Diagnosis not present

## 2020-12-11 ENCOUNTER — Other Ambulatory Visit: Payer: Self-pay

## 2020-12-11 ENCOUNTER — Ambulatory Visit (INDEPENDENT_AMBULATORY_CARE_PROVIDER_SITE_OTHER): Payer: BC Managed Care – PPO | Admitting: Internal Medicine

## 2020-12-11 ENCOUNTER — Encounter: Payer: Self-pay | Admitting: Internal Medicine

## 2020-12-11 VITALS — BP 110/68 | HR 73 | Temp 97.3°F | Ht 62.5 in | Wt 201.0 lb

## 2020-12-11 DIAGNOSIS — R7303 Prediabetes: Secondary | ICD-10-CM | POA: Diagnosis not present

## 2020-12-11 DIAGNOSIS — Z0000 Encounter for general adult medical examination without abnormal findings: Secondary | ICD-10-CM | POA: Diagnosis not present

## 2020-12-11 DIAGNOSIS — J301 Allergic rhinitis due to pollen: Secondary | ICD-10-CM

## 2020-12-11 NOTE — Assessment & Plan Note (Signed)
Does okay with prn cetirizine

## 2020-12-11 NOTE — Assessment & Plan Note (Signed)
Actually better last year Will check again

## 2020-12-11 NOTE — Assessment & Plan Note (Signed)
Healthy Discussed fitness Colon due 2027 Mammogram every 2 years---just done One last pap in 1-2 years COVID booster with covalent Flu vaccine in the fall

## 2020-12-11 NOTE — Patient Instructions (Signed)
https://www.nhlbi.nih.gov/files/docs/public/heart/dash_brief.pdf">  DASH Eating Plan DASH stands for Dietary Approaches to Stop Hypertension. The DASH eating plan is a healthy eating plan that has been shown to: Reduce high blood pressure (hypertension). Reduce your risk for type 2 diabetes, heart disease, and stroke. Help with weight loss. What are tips for following this plan? Reading food labels Check food labels for the amount of salt (sodium) per serving. Choose foods with less than 5 percent of the Daily Value of sodium. Generally, foods with less than 300 milligrams (mg) of sodium per serving fit into this eating plan. To find whole grains, look for the word "whole" as the first word in the ingredient list. Shopping Buy products labeled as "low-sodium" or "no salt added." Buy fresh foods. Avoid canned foods and pre-made or frozen meals. Cooking Avoid adding salt when cooking. Use salt-free seasonings or herbs instead of table salt or sea salt. Check with your health care provider or pharmacist before using salt substitutes. Do not fry foods. Cook foods using healthy methods such as baking, boiling, grilling, roasting, and broiling instead. Cook with heart-healthy oils, such as olive, canola, avocado, soybean, or sunflower oil. Meal planning  Eat a balanced diet that includes: 4 or more servings of fruits and 4 or more servings of vegetables each day. Try to fill one-half of your plate with fruits and vegetables. 6-8 servings of whole grains each day. Less than 6 oz (170 g) of lean meat, poultry, or fish each day. A 3-oz (85-g) serving of meat is about the same size as a deck of cards. One egg equals 1 oz (28 g). 2-3 servings of low-fat dairy each day. One serving is 1 cup (237 mL). 1 serving of nuts, seeds, or beans 5 times each week. 2-3 servings of heart-healthy fats. Healthy fats called omega-3 fatty acids are found in foods such as walnuts, flaxseeds, fortified milks, and eggs.  These fats are also found in cold-water fish, such as sardines, salmon, and mackerel. Limit how much you eat of: Canned or prepackaged foods. Food that is high in trans fat, such as some fried foods. Food that is high in saturated fat, such as fatty meat. Desserts and other sweets, sugary drinks, and other foods with added sugar. Full-fat dairy products. Do not salt foods before eating. Do not eat more than 4 egg yolks a week. Try to eat at least 2 vegetarian meals a week. Eat more home-cooked food and less restaurant, buffet, and fast food.  Lifestyle When eating at a restaurant, ask that your food be prepared with less salt or no salt, if possible. If you drink alcohol: Limit how much you use to: 0-1 drink a day for women who are not pregnant. 0-2 drinks a day for men. Be aware of how much alcohol is in your drink. In the [place] one drink equals one 12 oz bottle of beer (355 mL), one 5 oz glass of wine (148 mL), or one 1 oz glass of hard liquor (44 mL). General information Avoid eating more than 2,300 mg of salt a day. If you have hypertension, you may need to reduce your sodium intake to 1,500 mg a day. Work with your health care provider to maintain a healthy body weight or to lose weight. Ask what an ideal weight is for you. Get at least 30 minutes of exercise that causes your heart to beat faster (aerobic exercise) most days of the week. Activities may include walking, swimming, or biking. Work with your health care provider   or dietitian to adjust your eating plan to your individual calorie needs. What foods should I eat? Fruits All fresh, dried, or frozen fruit. Canned fruit in natural juice (without addedsugar). Vegetables Fresh or frozen vegetables (raw, steamed, roasted, or grilled). Low-sodium or reduced-sodium tomato and vegetable juice. Low-sodium or reduced-sodium tomatosauce and tomato paste. Low-sodium or reduced-sodium canned vegetables. Grains Whole-grain or  whole-wheat bread. Whole-grain or whole-wheat pasta. Brown rice. Oatmeal. Quinoa. Bulgur. Whole-grain and low-sodium cereals. Pita bread.Low-fat, low-sodium crackers. Whole-wheat flour tortillas. Meats and other proteins Skinless chicken or turkey. Ground chicken or turkey. Pork with fat trimmed off. Fish and seafood. Egg whites. Dried beans, peas, or lentils. Unsalted nuts, nut butters, and seeds. Unsalted canned beans. Lean cuts of beef with fat trimmed off. Low-sodium, lean precooked or cured meat, such as sausages or meatloaves. Dairy Low-fat (1%) or fat-free (skim) milk. Reduced-fat, low-fat, or fat-free cheeses. Nonfat, low-sodium ricotta or cottage cheese. Low-fat or nonfatyogurt. Low-fat, low-sodium cheese. Fats and oils Soft margarine without trans fats. Vegetable oil. Reduced-fat, low-fat, or light mayonnaise and salad dressings (reduced-sodium). Canola, safflower, olive, avocado, soybean, andsunflower oils. Avocado. Seasonings and condiments Herbs. Spices. Seasoning mixes without salt. Other foods Unsalted popcorn and pretzels. Fat-free sweets. The items listed above may not be a complete list of foods and beverages you can eat. Contact a dietitian for more information. What foods should I avoid? Fruits Canned fruit in a light or heavy syrup. Fried fruit. Fruit in cream or buttersauce. Vegetables Creamed or fried vegetables. Vegetables in a cheese sauce. Regular canned vegetables (not low-sodium or reduced-sodium). Regular canned tomato sauce and paste (not low-sodium or reduced-sodium). Regular tomato and vegetable juice(not low-sodium or reduced-sodium). Pickles. Olives. Grains Baked goods made with fat, such as croissants, muffins, or some breads. Drypasta or rice meal packs. Meats and other proteins Fatty cuts of meat. Ribs. Fried meat. Bacon. Bologna, salami, and other precooked or cured meats, such as sausages or meat loaves. Fat from the back of a pig (fatback). Bratwurst.  Salted nuts and seeds. Canned beans with added salt. Canned orsmoked fish. Whole eggs or egg yolks. Chicken or turkey with skin. Dairy Whole or 2% milk, cream, and half-and-half. Whole or full-fat cream cheese. Whole-fat or sweetened yogurt. Full-fat cheese. Nondairy creamers. Whippedtoppings. Processed cheese and cheese spreads. Fats and oils Butter. Stick margarine. Lard. Shortening. Ghee. Bacon fat. Tropical oils, suchas coconut, palm kernel, or palm oil. Seasonings and condiments Onion salt, garlic salt, seasoned salt, table salt, and sea salt. Worcestershire sauce. Tartar sauce. Barbecue sauce. Teriyaki sauce. Soy sauce, including reduced-sodium. Steak sauce. Canned and packaged gravies. Fish sauce. Oyster sauce. Cocktail sauce. Store-bought horseradish. Ketchup. Mustard. Meat flavorings and tenderizers. Bouillon cubes. Hot sauces. Pre-made or packaged marinades. Pre-made or packaged taco seasonings. Relishes. Regular saladdressings. Other foods Salted popcorn and pretzels. The items listed above may not be a complete list of foods and beverages you should avoid. Contact a dietitian for more information. Where to find more information National Heart, Lung, and Blood Institute: www.nhlbi.nih.gov American Heart Association: www.heart.org Academy of Nutrition and Dietetics: www.eatright.org National Kidney Foundation: www.kidney.org Summary The DASH eating plan is a healthy eating plan that has been shown to reduce high blood pressure (hypertension). It may also reduce your risk for type 2 diabetes, heart disease, and stroke. When on the DASH eating plan, aim to eat more fresh fruits and vegetables, whole grains, lean proteins, low-fat dairy, and heart-healthy fats. With the DASH eating plan, you should limit salt (sodium) intake to 2,300   mg a day. If you have hypertension, you may need to reduce your sodium intake to 1,500 mg a day. Work with your health care provider or dietitian to adjust  your eating plan to your individual calorie needs. This information is not intended to replace advice given to you by your health care provider. Make sure you discuss any questions you have with your healthcare provider. Document Revised: 03/26/2019 Document Reviewed: 03/26/2019 Elsevier Patient Education  2022 Elsevier Inc.  

## 2020-12-11 NOTE — Progress Notes (Signed)
Subjective:    Patient ID: Mercedes White, female    DOB: 06-25-1955, 65 y.o.   MRN: [id number]  HPI Here for physical This visit occurred during the SARS-CoV-2 public health emergency.  Safety protocols were in place, including screening questions prior to the visit, additional usage of staff PPE, and extensive cleaning of exam room while observing appropriate contact time as indicated for disinfecting solutions.   Doing well Still working from home mostly---[employer]  Worried about her weight No sugared drinks Doesn't limit carbs--discussed Not really exercising  Current Outpatient Medications on File Prior to Visit  Medication Sig Dispense Refill   cetirizine (ZYRTEC) 10 MG tablet Take 20 mg by mouth daily as needed.     Multiple Vitamin (MULTIVITAMIN) tablet Take 1 tablet by mouth daily. Centrum Silver     No current facility-administered medications on file prior to visit.    Allergies  Allergen Reactions   Cephalexin     REACTION: rash    Past Medical History:  Diagnosis Date   Allergy    Arthritis    hands    Diverticulosis    Hx of adenomatous polyp of colon 02/17/2019   Migraines    with menses    Past Surgical History:  Procedure Laterality Date   CESAREAN SECTION     x1   COLONOSCOPY  2010   WISDOM TOOTH EXTRACTION      Family History  Problem Relation Age of Onset   Arthritis Mother    Cancer Mother 65       Breast cancer   Breast cancer Mother 29   Diabetes Neg Hx    Hypertension Neg Hx    Colon cancer Neg Hx    Colon polyps Neg Hx    Esophageal cancer Neg Hx    Rectal cancer Neg Hx    Stomach cancer Neg Hx     Social History   Socioeconomic History   Marital status: Married    Spouse name: Not on file   Number of children: 2   Years of education: Not on file   Highest education level: Not on file  Occupational History   Occupation: patient billing    Employer: [employer]  Tobacco Use   Smoking status: Never   Smokeless  tobacco: Never  Substance and Sexual Activity   Alcohol use: Yes    Comment: occasionally    Drug use: No   Sexual activity: Not on file  Other Topics Concern   Not on file  Social History Narrative   Not on file   Social Determinants of Health   Financial Resource Strain: Not on file  Food Insecurity: Not on file  Transportation Needs: Not on file  Physical Activity: Not on file  Stress: Not on file  Social Connections: Not on file  Intimate Partner Violence: Not on file   Review of Systems  Constitutional:  Negative for fatigue and unexpected weight change.       Wears seat belt  HENT:  Negative for dental problem, hearing loss, tinnitus and trouble swallowing.   Eyes:  Negative for visual disturbance.       No diplopia or unilateral vision loss  Respiratory:  Negative for chest tightness and shortness of breath.        Only coughs if she laughs hard  Cardiovascular:  Negative for chest pain and palpitations.       Slight leg swelling at the end of the day  Gastrointestinal:  Negative for  blood in stool and constipation.       No heartburn  Endocrine: Negative for polydipsia and polyuria.  Genitourinary:  Negative for dyspareunia, dysuria and hematuria.       Uses lubricant  Musculoskeletal:  Negative for back pain and joint swelling.       Occ pain in fingers  Skin:  Negative for rash.  Allergic/Immunologic: Positive for environmental allergies. Negative for immunocompromised state.       Uses cetirizine prn  Neurological:  Negative for dizziness, syncope and light-headedness.       Occ sinus pressure headaches  Hematological:  Negative for adenopathy. Does not bruise/bleed easily.  Psychiatric/Behavioral:  Negative for dysphoric mood. The patient is not nervous/anxious.        Not sleeping great----has old dog that often keeps her up      Objective:   Physical Exam Constitutional:      Appearance: Normal appearance.  HENT:     Right Ear: Tympanic membrane and  ear canal normal.     Left Ear: Tympanic membrane and ear canal normal.     Mouth/Throat:     Pharynx: No oropharyngeal exudate or posterior oropharyngeal erythema.  Eyes:     Conjunctiva/sclera: Conjunctivae normal.     Pupils: Pupils are equal, round, and reactive to light.  Cardiovascular:     Rate and Rhythm: Normal rate and regular rhythm.     Pulses: Normal pulses.     Heart sounds: No murmur heard.   No gallop.  Pulmonary:     Effort: Pulmonary effort is normal.     Breath sounds: Normal breath sounds. No wheezing or rales.  Abdominal:     Palpations: Abdomen is soft.     Tenderness: There is no abdominal tenderness.  Musculoskeletal:     Cervical back: Neck supple.     Right lower leg: No edema.     Left lower leg: No edema.  Lymphadenopathy:     Cervical: No cervical adenopathy.  Skin:    General: Skin is warm.     Findings: No rash.  Neurological:     General: No focal deficit present.     Mental Status: She is alert and oriented to person, place, and time.  Psychiatric:        Mood and Affect: Mood normal.        Behavior: Behavior normal.           Assessment & Plan:

## 2020-12-12 LAB — COMPREHENSIVE METABOLIC PANEL
ALT: 21 IU/L (ref 0–32)
AST: 24 IU/L (ref 0–40)
Albumin/Globulin Ratio: 1.6 (ref 1.2–2.2)
Albumin: 4.3 g/dL (ref 3.8–4.8)
Alkaline Phosphatase: 76 IU/L (ref 44–121)
BUN/Creatinine Ratio: 18 (ref 12–28)
BUN: 11 mg/dL (ref 8–27)
Bilirubin Total: 0.3 mg/dL (ref 0.0–1.2)
CO2: 23 mmol/L (ref 20–29)
Calcium: 9.2 mg/dL (ref 8.7–10.3)
Chloride: 99 mmol/L (ref 96–106)
Creatinine, Ser: 0.61 mg/dL (ref 0.57–1.00)
Globulin, Total: 2.7 g/dL (ref 1.5–4.5)
Glucose: 85 mg/dL (ref 65–99)
Potassium: 4 mmol/L (ref 3.5–5.2)
Sodium: 137 mmol/L (ref 134–144)
Total Protein: 7 g/dL (ref 6.0–8.5)
eGFR: 100 mL/min/{1.73_m2} (ref >59)

## 2020-12-12 LAB — HEMOGLOBIN A1C
Est. average glucose Bld gHb Est-mCnc: 114 mg/dL
Hgb A1c MFr Bld: 5.6 % (ref 4.8–5.6)

## 2020-12-12 LAB — CBC
Hematocrit: 38.9 % (ref 34.0–46.6)
Hemoglobin: 13.1 g/dL (ref 11.1–15.9)
MCH: 30.3 pg (ref 26.6–33.0)
MCHC: 33.7 g/dL (ref 31.5–35.7)
MCV: 90 fL (ref 79–97)
Platelets: 309 10*3/uL (ref 150–450)
RBC: 4.33 x10E6/uL (ref 3.77–5.28)
RDW: 13.5 % (ref 11.7–15.4)
WBC: 9.8 10*3/uL (ref 3.4–10.8)

## 2021-07-31 ENCOUNTER — Encounter: Payer: Self-pay | Admitting: Internal Medicine

## 2021-08-14 ENCOUNTER — Encounter: Payer: Self-pay | Admitting: Internal Medicine

## 2021-08-14 ENCOUNTER — Ambulatory Visit (INDEPENDENT_AMBULATORY_CARE_PROVIDER_SITE_OTHER): Payer: BC Managed Care – PPO | Admitting: Internal Medicine

## 2021-08-14 VITALS — BP 136/80 | HR 89 | Ht 63.0 in | Wt 211.6 lb

## 2021-08-14 DIAGNOSIS — R7303 Prediabetes: Secondary | ICD-10-CM

## 2021-08-14 DIAGNOSIS — E669 Obesity, unspecified: Secondary | ICD-10-CM | POA: Diagnosis not present

## 2021-08-14 NOTE — Progress Notes (Signed)
? ?  Subjective:  ? ? Patient ID: Mercedes White, female    DOB: 1955-08-13, 66 y.o.   MRN: [id number] ? ?HPI ?Here to discuss weight loss ? ?Wonders about diet help ?Husband on ozempic and doing well ?Fighting with weight--will lose 10# and then gain it back ?Uses 2 packets of splenda daily---discussed that this is okay ? ?Uses Weight Watchers app---changing the points (allowing more) ?Knows DASH diet ? ?Did do Atkins years ago---premade meals and bars ?Current Outpatient Medications on File Prior to Visit  ?Medication Sig Dispense Refill  ? cetirizine (ZYRTEC) 10 MG tablet Take 20 mg by mouth daily as needed.    ? Multiple Vitamin (MULTIVITAMIN) tablet Take 1 tablet by mouth daily. Centrum Silver    ? ?No current facility-administered medications on file prior to visit.  ? ? ?Allergies  ?Allergen Reactions  ? Cephalexin   ?  REACTION: rash  ? ? ?Past Medical History:  ?Diagnosis Date  ? Allergy   ? Arthritis   ? hands   ? Diverticulosis   ? Hx of adenomatous polyp of colon 02/17/2019  ? Migraines   ? with menses  ? ? ?Past Surgical History:  ?Procedure Laterality Date  ? CESAREAN SECTION    ? x1  ? COLONOSCOPY  2010  ? WISDOM TOOTH EXTRACTION    ? ? ?Family History  ?Problem Relation Age of Onset  ? Arthritis Mother   ? Cancer Mother 34  ?     Breast cancer  ? Breast cancer Mother 41  ? Diabetes Neg Hx   ? Hypertension Neg Hx   ? Colon cancer Neg Hx   ? Colon polyps Neg Hx   ? Esophageal cancer Neg Hx   ? Rectal cancer Neg Hx   ? Stomach cancer Neg Hx   ? ? ?Social History  ? ?Socioeconomic History  ? Marital status: Married  ?  Spouse name: Not on file  ? Number of children: 2  ? Years of education: Not on file  ? Highest education level: Not on file  ?Occupational History  ? Occupation: patient billing  ?  Employer: [employer]  ?Tobacco Use  ? Smoking status: Never  ? Smokeless tobacco: Never  ?Vaping Use  ? Vaping Use: Never used  ?Substance and Sexual Activity  ? Alcohol use: Yes  ?  Comment: occasionally   ?  Drug use: No  ? Sexual activity: Not on file  ?Other Topics Concern  ? Not on file  ?Social History Narrative  ? Not on file  ? ?Social Determinants of Health  ? ?Financial Resource Strain: Not on file  ?Food Insecurity: Not on file  ?Transportation Needs: Not on file  ?Physical Activity: Not on file  ?Stress: Not on file  ?Social Connections: Not on file  ?Intimate Partner Violence: Not on file  ? ?Review of Systems ?Not sleeping well--goes back years. Should be better since old dog passed (not up to let out) ?Not exercising regularly---still works from home and is sedentary ?Trying to eat dinner earlier--before 7 ? ?   ?Objective:  ? Physical Exam ?Constitutional:   ?   Appearance: Normal appearance.  ?Neurological:  ?   Mental Status: She is alert.  ?Psychiatric:     ?   Mood and Affect: Mood normal.     ?   Behavior: Behavior normal.  ?  ? ? ? ? ?   ?Assessment & Plan:  ? ?

## 2021-08-14 NOTE — Patient Instructions (Signed)
Please try the [employer]. ?It is okay to try melatonin for sleep---probably start with '3mg'$  and can go up to as much as '10mg'$ . ?

## 2021-08-14 NOTE — Assessment & Plan Note (Signed)
Discussed [facility] ?Needs to exercise regularly ?Could consider semaglutide if no success ?

## 2021-11-15 ENCOUNTER — Other Ambulatory Visit: Payer: Self-pay | Admitting: Internal Medicine

## 2021-11-15 DIAGNOSIS — Z1231 Encounter for screening mammogram for malignant neoplasm of breast: Secondary | ICD-10-CM

## 2021-12-13 ENCOUNTER — Ambulatory Visit
Admission: RE | Admit: 2021-12-13 | Discharge: 2021-12-13 | Disposition: A | Discharge status: Home / Self Care | Payer: BC Managed Care – PPO | Source: Ambulatory Visit | Attending: Internal Medicine | Admitting: Internal Medicine

## 2021-12-13 DIAGNOSIS — Z1231 Encounter for screening mammogram for malignant neoplasm of breast: Secondary | ICD-10-CM | POA: Diagnosis not present

## 2021-12-18 ENCOUNTER — Encounter: Payer: Self-pay | Admitting: Internal Medicine

## 2021-12-18 ENCOUNTER — Ambulatory Visit (INDEPENDENT_AMBULATORY_CARE_PROVIDER_SITE_OTHER): Payer: BC Managed Care – PPO | Admitting: Internal Medicine

## 2021-12-18 VITALS — BP 118/70 | HR 69 | Temp 97.5°F | Ht 63.25 in | Wt 202.0 lb

## 2021-12-18 DIAGNOSIS — Z23 Encounter for immunization: Secondary | ICD-10-CM | POA: Diagnosis not present

## 2021-12-18 DIAGNOSIS — J301 Allergic rhinitis due to pollen: Secondary | ICD-10-CM

## 2021-12-18 DIAGNOSIS — E669 Obesity, unspecified: Secondary | ICD-10-CM

## 2021-12-18 DIAGNOSIS — N951 Menopausal and female climacteric states: Secondary | ICD-10-CM

## 2021-12-18 DIAGNOSIS — Z0000 Encounter for general adult medical examination without abnormal findings: Secondary | ICD-10-CM | POA: Diagnosis not present

## 2021-12-18 LAB — COMPREHENSIVE METABOLIC PANEL
ALT: 19 U/L (ref 0–35)
AST: 13 U/L (ref 0–37)
Albumin: 4.2 g/dL (ref 3.5–5.2)
Alkaline Phosphatase: 70 U/L (ref 39–117)
BUN: 14 mg/dL (ref 6–23)
CO2: 29 mEq/L (ref 19–32)
Calcium: 9.3 mg/dL (ref 8.4–10.5)
Chloride: 104 mEq/L (ref 96–112)
Creatinine, Ser: 0.62 mg/dL (ref 0.40–1.20)
GFR: 93.69 mL/min (ref >60.00)
Glucose, Bld: 94 mg/dL (ref 70–99)
Potassium: 5 mEq/L (ref 3.5–5.1)
Sodium: 138 mEq/L (ref 135–145)
Total Bilirubin: 0.5 mg/dL (ref 0.2–1.2)
Total Protein: 7 g/dL (ref 6.0–8.3)

## 2021-12-18 LAB — CBC
HCT: 39.9 % (ref 36.0–46.0)
Hemoglobin: 13.3 g/dL (ref 12.0–15.0)
MCHC: 33.3 g/dL (ref 30.0–36.0)
MCV: 90.4 fl (ref 78.0–100.0)
Platelets: 288 10*3/uL (ref 150.0–400.0)
RBC: 4.41 Mil/uL (ref 3.87–5.11)
RDW: 14.8 % (ref 11.5–15.5)
WBC: 6.8 10*3/uL (ref 4.0–10.5)

## 2021-12-18 LAB — LDL CHOLESTEROL, DIRECT: Direct LDL: 150 mg/dL

## 2021-12-18 LAB — LIPID PANEL
Cholesterol: 241 mg/dL — ABNORMAL HIGH (ref 0–200)
HDL: 61.7 mg/dL (ref >39.00)
NonHDL: 179.28
Total CHOL/HDL Ratio: 4
Triglycerides: 233 mg/dL — ABNORMAL HIGH (ref 0.0–149.0)
VLDL: 46.6 mg/dL — ABNORMAL HIGH (ref 0.0–40.0)

## 2021-12-18 NOTE — Assessment & Plan Note (Signed)
Healthy Prevnar 20 today Updated COVID and flu vaccines in the fall Discussed exercise Colon due 2027 Mammogram every 1-2 years till 76--just had Last pap next year

## 2021-12-18 NOTE — Assessment & Plan Note (Signed)
Rare hot flashes now

## 2021-12-18 NOTE — Addendum Note (Signed)
Addended by: Pilar Grammes on: 12/18/2021 08:45 AM   Modules accepted: Orders

## 2021-12-18 NOTE — Assessment & Plan Note (Signed)
Does well with cetirizine 

## 2021-12-18 NOTE — Assessment & Plan Note (Signed)
Working on healthy eating Discussed exercise

## 2021-12-18 NOTE — Progress Notes (Signed)
Subjective:    Patient ID: Mercedes White, female    DOB: 1956-04-25, 66 y.o.   MRN: [id number]  HPI Here for physical  Multiple family get togethers---lots of eating Still has lost 9# since April Trying to be careful also---not using the [employer] app as much  Inconsistent with exercise Limited outside due to heat  Current Outpatient Medications on File Prior to Visit  Medication Sig Dispense Refill   cetirizine (ZYRTEC) 10 MG tablet Take 20 mg by mouth daily as needed.     MAGNESIUM PO Take by mouth.     Multiple Vitamin (MULTIVITAMIN) tablet Take 1 tablet by mouth daily. Centrum Silver     No current facility-administered medications on file prior to visit.    Allergies  Allergen Reactions   Cephalexin     REACTION: rash    Past Medical History:  Diagnosis Date   Allergy    Arthritis    hands    Diverticulosis    Hx of adenomatous polyp of colon 02/17/2019   Migraines    with menses    Past Surgical History:  Procedure Laterality Date   CESAREAN SECTION     x1   COLONOSCOPY  2010   WISDOM TOOTH EXTRACTION      Family History  Problem Relation Age of Onset   Arthritis Mother    Cancer Mother 39       Breast cancer   Breast cancer Mother 51   Diabetes Neg Hx    Hypertension Neg Hx    Colon cancer Neg Hx    Colon polyps Neg Hx    Esophageal cancer Neg Hx    Rectal cancer Neg Hx    Stomach cancer Neg Hx     Social History   Socioeconomic History   Marital status: Married    Spouse name: Not on file   Number of children: 2   Years of education: Not on file   Highest education level: Not on file  Occupational History   Occupation: patient billing    Employer: [employer]  Tobacco Use   Smoking status: Never    Passive exposure: Past   Smokeless tobacco: Never  Vaping Use   Vaping Use: Never used  Substance and Sexual Activity   Alcohol use: Yes    Comment: occasionally    Drug use: No   Sexual activity: Not on file  Other Topics Concern    Not on file  Social History Narrative   Not on file   Social Determinants of Health   Financial Resource Strain: Not on file  Food Insecurity: Not on file  Transportation Needs: Not on file  Physical Activity: Not on file  Stress: Not on file  Social Connections: Not on file  Intimate Partner Violence: Not on file   Review of Systems  Constitutional:  Negative for fatigue and unexpected weight change.       Wears seat belt  HENT:  Negative for hearing loss and tinnitus.        Recent crown work---still on soft diet  Eyes:  Negative for visual disturbance.       No diplopia or unilateral vision loss  Respiratory:  Negative for cough, chest tightness and shortness of breath.   Cardiovascular:  Negative for chest pain, palpitations and leg swelling.  Gastrointestinal:  Negative for blood in stool and constipation.       No heartburn--avoids spicy food  Endocrine: Negative for polydipsia and polyuria.  Genitourinary:  Negative for difficulty urinating, dysuria and hematuria.       Uses lubricant for sex  Musculoskeletal:  Negative for arthralgias, back pain and joint swelling.       Rare knee popping/brief pain  Skin:  Negative for rash.  Allergic/Immunologic: Positive for environmental allergies. Negative for immunocompromised state.       Zyrtec is effective  Neurological:  Negative for dizziness, syncope and light-headedness.       Occ sinus HA or migraine  Hematological:  Negative for adenopathy. Does not bruise/bleed easily.  Psychiatric/Behavioral:  Negative for dysphoric mood. The patient is not nervous/anxious.        Chronic poor sleep---up with older dog for years (now gone)       Objective:   Physical Exam Constitutional:      Appearance: Normal appearance.  HENT:     Mouth/Throat:     Pharynx: No oropharyngeal exudate or posterior oropharyngeal erythema.  Eyes:     Conjunctiva/sclera: Conjunctivae normal.     Pupils: Pupils are equal, round, and reactive to  light.  Cardiovascular:     Rate and Rhythm: Normal rate and regular rhythm.     Pulses: Normal pulses.     Heart sounds: No murmur heard.    No gallop.  Pulmonary:     Effort: Pulmonary effort is normal.     Breath sounds: Normal breath sounds. No wheezing or rales.  Abdominal:     Palpations: Abdomen is soft.     Tenderness: There is no abdominal tenderness.  Musculoskeletal:     Cervical back: Neck supple.     Right lower leg: No edema.     Left lower leg: No edema.  Lymphadenopathy:     Cervical: No cervical adenopathy.  Skin:    Findings: No lesion or rash.  Neurological:     General: No focal deficit present.     Mental Status: She is alert and oriented to person, place, and time.  Psychiatric:        Mood and Affect: Mood normal.        Behavior: Behavior normal.            Assessment & Plan:

## 2022-02-14 ENCOUNTER — Encounter: Payer: Self-pay | Admitting: Internal Medicine

## 2022-03-11 ENCOUNTER — Encounter: Payer: Self-pay | Admitting: Internal Medicine

## 2022-11-15 ENCOUNTER — Other Ambulatory Visit: Payer: Self-pay | Admitting: Internal Medicine

## 2022-11-15 DIAGNOSIS — Z1231 Encounter for screening mammogram for malignant neoplasm of breast: Secondary | ICD-10-CM

## 2022-11-19 ENCOUNTER — Ambulatory Visit (INDEPENDENT_AMBULATORY_CARE_PROVIDER_SITE_OTHER)
Admission: RE | Admit: 2022-11-19 | Discharge: 2022-11-19 | Disposition: A | Discharge status: Home / Self Care | Payer: BC Managed Care – PPO | Source: Ambulatory Visit | Attending: Internal Medicine | Admitting: Internal Medicine

## 2022-11-19 ENCOUNTER — Ambulatory Visit: Payer: BC Managed Care – PPO | Admitting: Internal Medicine

## 2022-11-19 ENCOUNTER — Encounter: Payer: Self-pay | Admitting: Internal Medicine

## 2022-11-19 VITALS — BP 120/70 | HR 81 | Temp 97.3°F | Ht 63.25 in | Wt 209.0 lb

## 2022-11-19 DIAGNOSIS — M79645 Pain in left finger(s): Secondary | ICD-10-CM

## 2022-11-19 DIAGNOSIS — M7989 Other specified soft tissue disorders: Secondary | ICD-10-CM | POA: Diagnosis not present

## 2022-11-19 DIAGNOSIS — E785 Hyperlipidemia, unspecified: Secondary | ICD-10-CM | POA: Diagnosis not present

## 2022-11-19 DIAGNOSIS — M25542 Pain in joints of left hand: Secondary | ICD-10-CM | POA: Diagnosis not present

## 2022-11-19 NOTE — Patient Instructions (Signed)
Try over the counter topical diclofenac on your painful finger

## 2022-11-19 NOTE — Assessment & Plan Note (Addendum)
Inflammation and swelling in left 2nd DIP Likely OA related Sister (nurse) wondered about gout--would be highly unusual Will check x-ray and labs Can use oral analgesics still--but should try topical diclofenac

## 2022-11-19 NOTE — Addendum Note (Signed)
Addended by: Alvina Chou on: 11/19/2022 09:42 AM   Modules accepted: Orders

## 2022-11-19 NOTE — Progress Notes (Signed)
   Subjective:    Patient ID: Mercedes White, female    DOB: 1955-10-12, 67 y.o.   MRN: [id number]  HPI Here due to swelling and pain in her finger  Having pain in fingers for a couple of months Worst in DIP of left 2nd finger Has cyst in 5th fingers Has lots of pain in the area Used tylenol or ibuprofen---seems to help some Will hurt with bending--and severe if she hits it  Current Outpatient Medications on File Prior to Visit  Medication Sig Dispense Refill   cetirizine (ZYRTEC) 10 MG tablet Take 20 mg by mouth daily as needed.     MAGNESIUM PO Take by mouth.     Multiple Vitamins-Minerals (ONE-A-DAY PROACTIVE 65+ PO) Take 2 tablets by mouth daily at 8 pm.     No current facility-administered medications on file prior to visit.    Allergies  Allergen Reactions   Cephalexin     REACTION: rash    Past Medical History:  Diagnosis Date   Allergy    Arthritis    hands    Diverticulosis    Hx of adenomatous polyp of colon 02/17/2019   Migraines    with menses    Past Surgical History:  Procedure Laterality Date   CESAREAN SECTION     x1   COLONOSCOPY  2010   WISDOM TOOTH EXTRACTION      Family History  Problem Relation Age of Onset   Arthritis Mother    Cancer Mother 26       Breast cancer   Breast cancer Mother 40   Diabetes Neg Hx    Hypertension Neg Hx    Colon cancer Neg Hx    Colon polyps Neg Hx    Esophageal cancer Neg Hx    Rectal cancer Neg Hx    Stomach cancer Neg Hx     Social History   Socioeconomic History   Marital status: Married    Spouse name: Not on file   Number of children: 2   Years of education: Not on file   Highest education level: Not on file  Occupational History   Occupation: patient billing    Employer: [employer]  Tobacco Use   Smoking status: Never    Passive exposure: Past   Smokeless tobacco: Never  Vaping Use   Vaping status: Never Used  Substance and Sexual Activity   Alcohol use: Yes    Comment:  occasionally    Drug use: No   Sexual activity: Not on file  Other Topics Concern   Not on file  Social History Narrative   Not on file   Social Determinants of Health   Financial Resource Strain: Not on file  Food Insecurity: Not on file  Transportation Needs: Not on file  Physical Activity: Not on file  Stress: Not on file  Social Connections: Not on file  Intimate Partner Violence: Not on file   Review of Systems No clear injury--but might have gotten it stuck behind cabinet some years ago No fevers    Objective:   Physical Exam Musculoskeletal:     Comments: Non tender synovial cysts at DIP in both 5th fingers Inflamed DIP at left 2nd finger Mild tenderness only            Assessment & Plan:

## 2022-11-20 LAB — COMPREHENSIVE METABOLIC PANEL
ALT: 19 IU/L (ref 0–32)
AST: 16 IU/L (ref 0–40)
Albumin: 4.7 g/dL (ref 3.9–4.9)
Alkaline Phosphatase: 83 IU/L (ref 44–121)
BUN/Creatinine Ratio: 16 (ref 12–28)
BUN: 12 mg/dL (ref 8–27)
Bilirubin Total: 0.3 mg/dL (ref 0.0–1.2)
CO2: 24 mmol/L (ref 20–29)
Calcium: 9.5 mg/dL (ref 8.7–10.3)
Chloride: 102 mmol/L (ref 96–106)
Creatinine, Ser: 0.75 mg/dL (ref 0.57–1.00)
Globulin, Total: 2.7 g/dL (ref 1.5–4.5)
Glucose: 91 mg/dL (ref 70–99)
Potassium: 5 mmol/L (ref 3.5–5.2)
Sodium: 141 mmol/L (ref 134–144)
Total Protein: 7.4 g/dL (ref 6.0–8.5)
eGFR: 88 mL/min/{1.73_m2} (ref >59)

## 2022-11-20 LAB — LIPID PANEL
Chol/HDL Ratio: 3.6 ratio (ref 0.0–4.4)
Cholesterol, Total: 230 mg/dL — ABNORMAL HIGH (ref 100–199)
HDL: 64 mg/dL (ref >39)
LDL Chol Calc (NIH): 111 mg/dL — ABNORMAL HIGH (ref 0–99)
Triglycerides: 321 mg/dL — ABNORMAL HIGH (ref 0–149)
VLDL Cholesterol Cal: 55 mg/dL — ABNORMAL HIGH (ref 5–40)

## 2022-11-20 LAB — CBC
Hematocrit: 41.7 % (ref 34.0–46.6)
Hemoglobin: 13.6 g/dL (ref 11.1–15.9)
MCH: 30.1 pg (ref 26.6–33.0)
MCHC: 32.6 g/dL (ref 31.5–35.7)
MCV: 92 fL (ref 79–97)
Platelets: 312 10*3/uL (ref 150–450)
RBC: 4.52 x10E6/uL (ref 3.77–5.28)
RDW: 13.4 % (ref 11.7–15.4)
WBC: 7.3 10*3/uL (ref 3.4–10.8)

## 2022-11-20 LAB — SEDIMENTATION RATE: Sed Rate: 22 mm/hr (ref 0–40)

## 2022-11-20 LAB — URIC ACID: Uric Acid: 3.9 mg/dL (ref 3.0–7.2)

## 2022-12-17 ENCOUNTER — Ambulatory Visit
Admission: RE | Admit: 2022-12-17 | Discharge: 2022-12-17 | Disposition: A | Discharge status: Home / Self Care | Payer: Medicare Other | Source: Ambulatory Visit | Attending: Internal Medicine | Admitting: Internal Medicine

## 2022-12-17 DIAGNOSIS — Z1231 Encounter for screening mammogram for malignant neoplasm of breast: Secondary | ICD-10-CM | POA: Insufficient documentation

## 2023-02-14 ENCOUNTER — Encounter: Payer: Self-pay | Admitting: Internal Medicine

## 2023-10-30 ENCOUNTER — Encounter: Payer: Self-pay | Admitting: Internal Medicine

## 2024-02-17 ENCOUNTER — Telehealth: Payer: Self-pay

## 2024-02-17 NOTE — Telephone Encounter (Signed)
 Copied from CRM [id number]. Topic: Appointments - Transfer of Care >> Feb 17, 2024 12:02 PM Tysheama G wrote: Pt is requesting to transfer FROM: same location, different provider Pt is requesting to transfer TO: same location Reason for requested transfer: Dr.Letvak retired. It is the responsibility of the team the patient would like to transfer to (Dr. Carrol Aurora) to reach out to the patient if for any reason this transfer is not acceptable.    Please call to schedule

## 2024-03-05 ENCOUNTER — Encounter: Payer: Self-pay | Admitting: General Practice

## 2024-03-05 ENCOUNTER — Ambulatory Visit: Admitting: General Practice

## 2024-03-05 ENCOUNTER — Ambulatory Visit
Admission: RE | Admit: 2024-03-05 | Discharge: 2024-03-05 | Disposition: A | Discharge status: Home / Self Care | Source: Ambulatory Visit | Attending: General Practice | Admitting: General Practice

## 2024-03-05 VITALS — BP 124/74 | HR 76 | Temp 98.1°F | Ht 62.5 in | Wt 214.0 lb

## 2024-03-05 DIAGNOSIS — Z23 Encounter for immunization: Secondary | ICD-10-CM

## 2024-03-05 DIAGNOSIS — W19XXXA Unspecified fall, initial encounter: Secondary | ICD-10-CM

## 2024-03-05 DIAGNOSIS — Z1231 Encounter for screening mammogram for malignant neoplasm of breast: Secondary | ICD-10-CM

## 2024-03-05 DIAGNOSIS — M25561 Pain in right knee: Secondary | ICD-10-CM

## 2024-03-05 DIAGNOSIS — R7303 Prediabetes: Secondary | ICD-10-CM

## 2024-03-05 DIAGNOSIS — Z0000 Encounter for general adult medical examination without abnormal findings: Secondary | ICD-10-CM

## 2024-03-05 DIAGNOSIS — M159 Polyosteoarthritis, unspecified: Secondary | ICD-10-CM

## 2024-03-05 DIAGNOSIS — Z7689 Persons encountering health services in other specified circumstances: Secondary | ICD-10-CM | POA: Insufficient documentation

## 2024-03-05 DIAGNOSIS — E785 Hyperlipidemia, unspecified: Secondary | ICD-10-CM | POA: Diagnosis not present

## 2024-03-05 DIAGNOSIS — Z780 Asymptomatic menopausal state: Secondary | ICD-10-CM

## 2024-03-05 NOTE — Assessment & Plan Note (Signed)
 Immunizations UTD; influenza vaccine given today.  Mammogram due, order placed. Colonoscopy UTD,    Discussed the importance of a healthy diet and regular exercise in order for weight loss, and to reduce the risk of further co-morbidity.  Exam stable. Labs pending.  Follow up in 1 year for repeat physical.

## 2024-03-05 NOTE — Assessment & Plan Note (Signed)
 Discussed multiple treatment options.  She would like to try a natural product with collagen, tumeric, MSM. Discussed that it is not FDA approved.  Verbalizes understanding.  Will update if she would like try medication.

## 2024-03-05 NOTE — Patient Instructions (Addendum)
 Complete xray(s) prior to leaving today. I will notify you of your results once received.   Call and schedule bone density and mammogram.   Schedule lab ONLY appointment. Please come fasting for four prior to appointment. You are allowed water and black coffee.    Schedule medicare wellness visit in 6 months.   Follow up in one year.   It was a pleasure meeting you!

## 2024-03-05 NOTE — Progress Notes (Signed)
 Established Patient Office Visit  Subjective   Patient ID: Mercedes White, female    DOB: 03/02/1956  Age: 68 y.o. MRN: [id number]  Chief Complaint  Patient presents with   New Patient (Initial Visit)    TOC from Dr. Jimmy   Knee Pain    Patient fell beginning of the month and hurt her right knee   Weight Loss    Patient wants to discuss going on GLP 1    Knee Pain     Mercedes White is a 68 year old female with past medical history of allergic rhinitis, OA, obesity presents today for transfer of care.   Discussed the use of AI scribe software for clinical note transcription with the patient, who gave verbal consent to proceed.  History of Present Illness Mercedes White or Mercedes White is a 68 year old female who presents for a transfer of care appointment and evaluation of knee pain.  She has been experiencing severe right knee pain following a fall at the beginning of the month. The fall occurred when she tripped on the last step while going up, causing her right ankle to give out and resulting in a fall on the landing, primarily impacting her right knee. The pain is intermittent throughout the day and sometimes presents as a shooting pain when stepping incorrectly. She is able to bear weight and walk, but the pain persists, especially when sitting in a recliner or sleeping, requiring adjustments in leg position for comfort. Ibuprofen provides relief, and she has also used Voltaren gel, although she finds it inconvenient to apply. The pain is sometimes alleviated by changing positions, such as bending the knee when it is straightened in a recliner.  She has a history of prediabetes and slightly elevated cholesterol. She is not currently on any prescription medications but takes D3 supplements and occasionally uses MCT oil in her coffee. She has difficulty in consistently taking medications, often forgetting to take them despite setting reminders.  She is considering  weight management options, acknowledging her weight may be contributing to her knee pain and arthritis. She has previously attended a nutrition class and is contemplating rejoining [employer] or exploring other weight management programs. She has access to exercise equipment at home but has not been using it due to knee pain.   Immunizations: -Tetanus: Completed in 2025.  -Influenza: due -Shingles: Completed Shingrix  series -Pneumonia: Completed   Diet: Fair diet.  Exercise: No regular exercise.  Eye exam: Completes annually  Dental exam: Completes semi-annually    Mammogram: Completed in 2024; due Bone Density Scan: due  Colonoscopy: Completed in 2020   Patient Active Problem List   Diagnosis Date Noted   Establishing care with new doctor, encounter for 03/05/2024   Fall 03/05/2024   Acute pain of right knee 03/05/2024   Hyperlipidemia 03/05/2024   Finger pain, left 11/19/2022   Hx of adenomatous polyp of colon 02/17/2019   Prediabetes 12/02/2017   Osteoarthrosis, generalized, involving multiple sites 11/14/2015   Obesity (BMI 30-39.9) 11/01/2013   Routine general medical examination at a health care facility 11/01/2010   MENOPAUSAL SYNDROME 07/16/2010   Chronic seasonal allergic rhinitis due to pollen 01/14/2008   Premenstrual tension syndrome 01/13/2008   Past Medical History:  Diagnosis Date   Allergy    Arthritis    hands    Diverticulosis    Hx of adenomatous polyp of colon 02/17/2019   Migraines    with menses   Past Surgical  History:  Procedure Laterality Date   CESAREAN SECTION     x1   COLONOSCOPY  2010   WISDOM TOOTH EXTRACTION     Allergies  Allergen Reactions   Cephalexin     REACTION: rash         03/05/2024    9:00 AM 12/18/2021    7:43 AM 12/11/2020    3:15 PM  Depression screen PHQ 2/9  Decreased Interest 0 0 0  Down, Depressed, Hopeless 0 0 0  PHQ - 2 Score 0 0 0  Altered sleeping 2    Tired, decreased energy 2    Change in  appetite 3    Feeling bad or failure about yourself  0    Trouble concentrating 0    Moving slowly or fidgety/restless 0    Suicidal thoughts 0    PHQ-9 Score 7    Difficult doing work/chores Somewhat difficult         03/05/2024    9:00 AM  GAD 7 : Generalized Anxiety Score  Nervous, Anxious, on Edge 0  Control/stop worrying 0  Worry too much - different things 0  Trouble relaxing 1  Restless 0  Easily annoyed or irritable 0  Afraid - awful might happen 0  Total GAD 7 Score 1  Anxiety Difficulty Not difficult at all      Review of Systems  Constitutional:  Negative for chills and fever.  Respiratory:  Negative for shortness of breath.   Cardiovascular:  Negative for chest pain.  Gastrointestinal:  Negative for abdominal pain, constipation, diarrhea, heartburn, nausea and vomiting.  Genitourinary:  Negative for dysuria, frequency and urgency.  Neurological:  Negative for dizziness and headaches.  Endo/Heme/Allergies:  Negative for polydipsia.  Psychiatric/Behavioral:  Negative for depression and suicidal ideas. The patient is not nervous/anxious.       Objective:     BP 124/74   Pulse 76   Temp 98.1 F (36.7 C) (Oral)   Ht 5' 2.5 (1.588 m)   Wt 214 lb (97.1 kg)   SpO2 97%   BMI 38.52 kg/m  BP Readings from Last 3 Encounters:  03/05/24 124/74  11/19/22 120/70  12/18/21 118/70   Wt Readings from Last 3 Encounters:  03/05/24 214 lb (97.1 kg)  11/19/22 209 lb (94.8 kg)  12/18/21 202 lb (91.6 kg)      Physical Exam Vitals and nursing note reviewed.  Constitutional:      Appearance: Normal appearance.  HENT:     Head: Normocephalic and atraumatic.     Right Ear: Tympanic membrane, ear canal and external ear normal.     Left Ear: Tympanic membrane, ear canal and external ear normal.     Nose: Nose normal.     Mouth/Throat:     Mouth: Mucous membranes are moist.     Pharynx: Oropharynx is clear.  Eyes:     Conjunctiva/sclera: Conjunctivae normal.      Pupils: Pupils are equal, round, and reactive to light.  Cardiovascular:     Rate and Rhythm: Normal rate and regular rhythm.     Pulses: Normal pulses.     Heart sounds: Normal heart sounds.  Pulmonary:     Effort: Pulmonary effort is normal.     Breath sounds: Normal breath sounds.  Abdominal:     General: Abdomen is flat. Bowel sounds are normal.     Palpations: Abdomen is soft.  Musculoskeletal:     Cervical back: Normal range of motion.  Right knee: No bony tenderness. Decreased range of motion. Tenderness present.     Left knee: Normal.  Skin:    General: Skin is warm and dry.     Capillary Refill: Capillary refill takes less than 2 seconds.  Neurological:     General: No focal deficit present.     Mental Status: She is alert and oriented to person, place, and time. Mental status is at baseline.  Psychiatric:        Mood and Affect: Mood normal.        Behavior: Behavior normal.        Thought Content: Thought content normal.        Judgment: Judgment normal.      No results found for any visits on 03/05/24.     The 10-year ASCVD risk score (Arnett DK, et al., 2019) is: 6.6%    Assessment & Plan:  Hyperlipidemia, unspecified hyperlipidemia type -     CBC; Future -     Comprehensive metabolic panel with GFR; Future -     Lipid panel; Future -     TSH; Future  Establishing care with new doctor, encounter for Assessment & Plan: EMR reviewed briefly.     Need for influenza vaccination -     Flu vaccine HIGH DOSE PF(Fluzone Trivalent)  Postmenopausal -     DG Bone Density; Future  Encounter for screening mammogram for malignant neoplasm of breast -     3D Screening Mammogram, Left and Right; Future  Prediabetes -     Hemoglobin A1c; Future  Routine general medical examination at a health care facility Assessment & Plan: Immunizations UTD; influenza vaccine given today.  Mammogram due, order placed. Colonoscopy UTD,    Discussed the importance of  a healthy diet and regular exercise in order for weight loss, and to reduce the risk of further co-morbidity.  Exam stable. Labs pending.  Follow up in 1 year for repeat physical.    Fall, initial encounter  Acute pain of right knee -     DG Knee Complete 4 Views Right  Osteoarthrosis, generalized, involving multiple sites Assessment & Plan: Discussed multiple treatment options.  She would like to try a natural product with collagen, tumeric, MSM. Discussed that it is not FDA approved.  Verbalizes understanding.  Will update if she would like try medication.      Assessment and Plan Assessment & Plan Right knee pain after fall with underlying osteoarthritis Right knee pain following a fall. Pain is intermittent, sharp, and exacerbated by movement. Ibuprofen and Voltaren gel provide relief, indicating inflammation. Suspected underlying osteoarthritis. X-ray considered to rule out injury from the fall. - Xray pending. - Consider short course of prednisone  if inflammation persists. - Continue ibuprofen and Voltaren gel as needed. - await results.  Overweight and obesity management Discussed weight management options. Prediabetes and slightly elevated cholesterol present. Discussed potential benefits and limitations of GLP-1 medications and other weight management strategies. She is not interested in long-term medication use. Discussed [facility] as an alternative. Emphasized the importance of exercise and dietary modifications. - Labs pending. - Consider referral to [facility]  - Encourage exercise and dietary modifications.  Hyperlipidemia Slightly elevated cholesterol noted last year. No current medication for hyperlipidemia. Discussed lifestyle modifications and potential need for medication based on lab results. - Labs pending.  Prediabetes, history of Prediabetes present. No current medication. Discussed importance of  monitoring and lifestyle modifications to prevent progression  to diabetes. - Labs pending.   Return in about 1 year (around 03/05/2025) for chronic care management and fasting labs. SABRA Carrol Aurora, NP

## 2024-03-05 NOTE — Assessment & Plan Note (Signed)
 EMR reviewed briefly.

## 2024-03-08 ENCOUNTER — Other Ambulatory Visit (INDEPENDENT_AMBULATORY_CARE_PROVIDER_SITE_OTHER)

## 2024-03-08 DIAGNOSIS — E785 Hyperlipidemia, unspecified: Secondary | ICD-10-CM | POA: Diagnosis not present

## 2024-03-08 DIAGNOSIS — R7303 Prediabetes: Secondary | ICD-10-CM

## 2024-03-08 NOTE — Addendum Note (Signed)
 Addended by: HOPE VEVA PARAS on: 03/08/2024 07:48 AM   Modules accepted: Orders

## 2024-03-09 ENCOUNTER — Ambulatory Visit: Payer: Self-pay | Admitting: General Practice

## 2024-03-09 DIAGNOSIS — M25561 Pain in right knee: Secondary | ICD-10-CM

## 2024-03-09 LAB — CBC
Hematocrit: 38.6 % (ref 34.0–46.6)
Hemoglobin: 12.7 g/dL (ref 11.1–15.9)
MCH: 30.1 pg (ref 26.6–33.0)
MCHC: 32.9 g/dL (ref 31.5–35.7)
MCV: 92 fL (ref 79–97)
Platelets: 301 x10E3/uL (ref 150–450)
RBC: 4.22 x10E6/uL (ref 3.77–5.28)
RDW: 13.5 % (ref 11.7–15.4)
WBC: 6.3 x10E3/uL (ref 3.4–10.8)

## 2024-03-09 LAB — LIPID PANEL
Chol/HDL Ratio: 3.4 ratio (ref 0.0–4.4)
Cholesterol, Total: 206 mg/dL — ABNORMAL HIGH (ref 100–199)
HDL: 60 mg/dL (ref >39)
LDL Chol Calc (NIH): 123 mg/dL — ABNORMAL HIGH (ref 0–99)
Triglycerides: 128 mg/dL (ref 0–149)
VLDL Cholesterol Cal: 23 mg/dL (ref 5–40)

## 2024-03-09 LAB — HEMOGLOBIN A1C
Est. average glucose Bld gHb Est-mCnc: 123 mg/dL
Hgb A1c MFr Bld: 5.9 % — ABNORMAL HIGH (ref 4.8–5.6)

## 2024-03-09 LAB — COMPREHENSIVE METABOLIC PANEL WITH GFR
ALT: 14 IU/L (ref 0–32)
AST: 14 IU/L (ref 0–40)
Albumin: 4.4 g/dL (ref 3.9–4.9)
Alkaline Phosphatase: 75 IU/L (ref 49–135)
BUN/Creatinine Ratio: 18 (ref 12–28)
BUN: 12 mg/dL (ref 8–27)
Bilirubin Total: 0.5 mg/dL (ref 0.0–1.2)
CO2: 22 mmol/L (ref 20–29)
Calcium: 9.3 mg/dL (ref 8.7–10.3)
Chloride: 103 mmol/L (ref 96–106)
Creatinine, Ser: 0.65 mg/dL (ref 0.57–1.00)
Globulin, Total: 2.6 g/dL (ref 1.5–4.5)
Glucose: 99 mg/dL (ref 70–99)
Potassium: 4.4 mmol/L (ref 3.5–5.2)
Sodium: 138 mmol/L (ref 134–144)
Total Protein: 7 g/dL (ref 6.0–8.5)
eGFR: 96 mL/min/1.73 (ref >59)

## 2024-03-09 LAB — TSH: TSH: 1.96 u[IU]/mL (ref 0.450–4.500)

## 2024-03-15 ENCOUNTER — Ambulatory Visit: Attending: General Practice

## 2024-03-15 DIAGNOSIS — M25661 Stiffness of right knee, not elsewhere classified: Secondary | ICD-10-CM | POA: Diagnosis present

## 2024-03-15 DIAGNOSIS — G8929 Other chronic pain: Secondary | ICD-10-CM | POA: Diagnosis present

## 2024-03-15 DIAGNOSIS — M6281 Muscle weakness (generalized): Secondary | ICD-10-CM | POA: Diagnosis present

## 2024-03-15 DIAGNOSIS — M25561 Pain in right knee: Secondary | ICD-10-CM | POA: Diagnosis present

## 2024-03-15 NOTE — Therapy (Signed)
 OUTPATIENT PHYSICAL THERAPY KNEE EVALUATION/TREATMENT  Patient Name: Mercedes White MRN: [id number] DOB:1956/01/02, 68 y.o., female Today's Date: 03/15/2024  END OF SESSION:  PT End of Session - 03/15/24 0816     Visit Number 1    Number of Visits 17    Date for Recertification  05/10/24    PT Start Time 0817    PT Stop Time 0900    PT Time Calculation (min) 43 min    Activity Tolerance Patient tolerated treatment well    Behavior During Therapy [facility] for tasks assessed/performed          Past Medical History:  Diagnosis Date   Allergy    Arthritis    hands    Diverticulosis    Hx of adenomatous polyp of colon 02/17/2019   Migraines    with menses   Past Surgical History:  Procedure Laterality Date   CESAREAN SECTION     x1   COLONOSCOPY  2010   WISDOM TOOTH EXTRACTION     Patient Active Problem List   Diagnosis Date Noted   Establishing care with new doctor, encounter for 03/05/2024   Fall 03/05/2024   Acute pain of right knee 03/05/2024   Hyperlipidemia 03/05/2024   Finger pain, left 11/19/2022   Hx of adenomatous polyp of colon 02/17/2019   Prediabetes 12/02/2017   Osteoarthrosis, generalized, involving multiple sites 11/14/2015   Obesity (BMI 30-39.9) 11/01/2013   Routine general medical examination at a health care facility 11/01/2010   MENOPAUSAL SYNDROME 07/16/2010   Chronic seasonal allergic rhinitis due to pollen 01/14/2008   Premenstrual tension syndrome 01/13/2008    PCP: Vincente Shivers, NP  REFERRING PROVIDER: Vincente Shivers, NP  REFERRING DIAG:  [phone] (ICD-10-CM) - Acute pain of right knee      RATIONALE FOR EVALUATION AND TREATMENT: Rehabilitation  THERAPY DIAG: Chronic pain of right knee  Stiffness of right knee, not elsewhere classified  Muscle weakness (generalized)  ONSET DATE: October 10th 2025  FOLLOW-UP APPT SCHEDULED WITH REFERRING PROVIDER: No    SUBJECTIVE:                                                                                                                                                                                          SUBJECTIVE STATEMENT:    Patient reports to OPPT with a chief concern of L knee pain.   PERTINENT HISTORY:   Patient reports a fall on Oct 10th; she reports that she tripped on the last step (going up), her shoes caught the last step and she fell and sprained her ankle. Patient reports that she has used tylenol and voltaren with  minimal relief. She reports that's a reclined position relieves the pain however as soon as she transitions from reclined to seated the pain returns. Patient uses a recumbent elliptical in order to transition from the recliner. Patient reports that the pain prevents her from prolonged standing, sitting and she has to navigate stairs one step at a time.   Denies b/b, Saddle parasthesia, chills/fever, night sweats, nausea, vomiting,   Imaging:   EXAM: 4 VIEW(S) XRAY OF THE RIGHT KNEE 03/05/2024 09:58:31 AM   COMPARISON: None available.   CLINICAL HISTORY: acute right knee pain x 4 weeks after fall.   FINDINGS:   BONES AND JOINTS: No acute fracture. No focal osseous lesion. No joint dislocation. There is a small joint effusion. There is lateral patellofemoral compartment joint space narrowing compatible with moderate degenerative change. There are also mild degenerative changes at the lateral compartment of the knee with joint space narrowing and osteophyte formation.   SOFT TISSUES: The soft tissues are unremarkable.   IMPRESSION: 1. Small joint effusion. 2. No acute findings. 3. Moderate degenerative change of the knee.   Electronically signed by: Greig Pique MD 03/09/2024 11:31 PM EST RP Workstation: [id number]  PAIN:   Pain Intensity: Present: 0/10, Best: /10, Worst: 8/10 Pain location: R knee  Pain quality: intermittent and sharp  Radiating pain: Yes  Swelling: Yes  Numbness/Tingling: No Focal weakness or  buckling: Yes History of prior back, hip, or knee injury, pain, surgery, or therapy: No  PRECAUTIONS: Fall  WEIGHT BEARING RESTRICTIONS: No  FALLS: Has patient fallen in last 6 months? Yes. Number of falls 1  Living Environment Lives with: lives with their spouse Lives in: House/apartment Stairs: Internal 15 Step; Rail Support: Both   Prior level of function: Independent  Occupational demands: Retired   Hobbies: Taking Care of her dog  PATIENT GOALS: Patient would like to be able to walk without wobbling and get my knee stronger.    OBJECTIVE:   Patient Surveys  LEFS  Extreme difficulty/unable (0), Quite a bit of difficulty (1), Moderate difficulty (2), Little difficulty (3), No difficulty (4) Survey date:  03/15/2024  Any of your usual work, housework or school activities 3  2. Usual hobbies, recreational or sporting activities 2  3. Getting into/out of the bath 0  4. Walking between rooms 4  5. Putting on socks/shoes 3  6. Squatting  1  7. Lifting an object, like a bag of groceries from the floor 3  8. Performing light activities around your home 3  9. Performing heavy activities around your home 1  10. Getting into/out of a car 2  11. Walking 2 blocks 1  12. Walking 1 mile 0  13. Going up/down 10 stairs (1 flight) 3  14. Standing for 1 hour 2  15.  sitting for 1 hour 2  16. Running on even ground 0  17. Running on uneven ground 0  18. Making sharp turns while running fast 0  19. Hopping  0  20. Rolling over in bed 2  Score total:  28/80     Cognition WNL     Gross Musculoskeletal Assessment Tremor: None Bulk: Normal Tone: Normal  GAIT: Distance walked: 10 m Assistive device utilized: None Level of assistance: Complete Independence Comments: Decreased weight bearing on R, reciprocal gait pattern   Posture:  AROM AROM (Normal range in degrees) AROM  Hip Right Left  Flexion (125)    Extension (15)    Abduction (40)    Adduction  Internal  Rotation (45)    External Rotation (45)        Knee    Flexion (135) 100 130  Extension (0) 0 0      Ankle    Dorsiflexion (20)    Plantarflexion (50)    Inversion (35)    Eversion (15    (* = pain; Blank rows = not tested)  LE MMT: MMT (out of 5) Right Left  Hip flexion 4- 4-  Hip extension    Hip abduction    Hip adduction    Hip internal rotation    Hip external rotation    Knee flexion 4- 4  Knee extension 4- 4  Ankle dorsiflexion 4+ 4+  Ankle plantarflexion 5 5  Ankle inversion    Ankle eversion    (* = pain; Blank rows = not tested)  Sensation Grossly intact to light touch bilateral LEs as determined by testing dermatomes L2-S2. Proprioception, and hot/cold testing deferred on this date.  Reflexes R/L Knee Jerk (L3/4): 2+/2+  Ankle Jerk (S1/2): 2+/2+   Muscle Length Hamstrings: R: Positive for tightness    Palpation Location LEFT  RIGHT           Quadriceps  1  Medial Hamstrings  0  Lateral Hamstrings  0  Lateral Hamstring tendon  0  Medial Hamstring tendon  0  Quadriceps tendon  0  Patella    Patellar Tendon  1  Tibial Tuberosity    Medial joint line  0  Lateral joint line  0  MCL  0  LCL  0  Adductor Tubercle    Pes Anserine tendon    Infrapatellar fat pad    Fibular head    Popliteal fossa    (Blank rows = not tested) Graded on 0-4 scale (0 = no pain, 1 = pain, 2 = pain with wincing/grimacing/flinching, 3 = pain with withdrawal, 4 = unwilling to allow palpation), (Blank rows = not tested)  Passive Accessory Motion Deferred  VASCULAR Deferred    SPECIAL TESTS  Ligamentous Stability  ACL: Lachman's: R: Negative  Anterior Drawer: R: Negative   MCL: Valgus Stress (30 degrees flexion): R: Negative  LCL: Varus Stress (30 degrees flexion): R: Negative   Functional Tests:  : .75 m/s 5TSTS: 14.07s  TODAY'S TREATMENT: DATE: 03/15/24  Reviewed HEP with return demonstration:  Access Code: [facility] URL:  [email or web address] Date: 03/15/2024 Prepared by: Lonni Pall  Exercises - Seated Piriformis Stretch  - 2 x daily - 7 x weekly - 3 sets - 15- 30s hold - Seated Piriformis Stretch  - 2 x daily - 7 x weekly - 2-3 sets - 30s hold - Seated Hamstring Stretch  - 2 x daily - 7 x weekly - 3 sets - 15-30s hold - Mini Squat with Counter Support  - 2 x daily - 3-4 x weekly - 2 sets - 10-12 reps  PATIENT EDUCATION:  Education details: HEP, POC, Exercise Technique, Person educated: Patient Education method: Explanation, Demonstration, and Handouts Education comprehension: verbalized understanding and returned demonstration   HOME EXERCISE PROGRAM:  Access Code: [email or web address] URL: [email or web address] Date: 03/15/2024 Prepared by: Lonni Pall  Exercises - Seated Piriformis Stretch  - 2 x daily - 7 x weekly - 3 sets - 15- 30s hold - Seated Piriformis Stretch  - 2 x daily - 7 x weekly - 2-3 sets - 30s hold - Seated Hamstring Stretch  - 2 x daily - 7 x weekly - 3 sets - 15-30s  hold - Mini Squat with Counter Support  - 2 x daily - 3-4 x weekly - 2 sets - 10-12 reps  ASSESSMENT:  CLINICAL IMPRESSION: Patient is a 68 y.o. female who was seen today for physical therapy evaluation and treatment for R Knee pain following a fall last month. Patient presents with decreased LE strength, decreased ROM and pain in the R knee. Gait affected by knee pain; she presents with decreased weight bearing in R stance. Special tests negative for ligamentous structures. Palpation significant for pain in quadriceps tendon and patellar tendon; concordant pain with knee flexion. R knee flexion significantly less than L knee secondary to pain. Functional tests presenting with decreased gait speed and decreased LE strength (see above at and 5TSTS). Based on today's performance patient will benefit from skilled PT in order to address current deficits and improve Quality of life.   OBJECTIVE  IMPAIRMENTS: Abnormal gait, cardiopulmonary status limiting activity, decreased endurance, difficulty walking, decreased ROM, decreased strength, and pain.   ACTIVITY LIMITATIONS: sitting, standing, squatting, stairs, and transfers  PARTICIPATION LIMITATIONS: community activity and yard work  PERSONAL FACTORS: Age, Past/current experiences, and Time since onset of injury/illness/exacerbation are also affecting patient's functional outcome.   REHAB POTENTIAL: Good  CLINICAL DECISION MAKING: Evolving/moderate complexity  EVALUATION COMPLEXITY: Moderate   GOALS: Goals reviewed with patient? Yes  SHORT TERM GOALS: Target date: 04/12/2024  Pt will be independent with HEP to improve strength and decrease knee pain to improve pain-free function at home and work. Baseline: 03/15/2024: Initial HEP provided Goal status: INITIAL   LONG TERM GOALS: Target date: 05/10/2024  Pt will decrease 5TSTS by at least 3 seconds in order to demonstrate clinically significant improvement in LE strength  Baseline: 03/15/2024: 14.07s Goal status: INITIAL  2.  Pt will decrease worst knee pain by at least 3 points on the NPRS in order to demonstrate clinically significant reduction in knee pain. Baseline: 03/15/2024: 8/10 NPS Goal status: INITIAL  3.  Pt will increase LEFS score by at least 9 points in order demonstrate clinically significant reduction in knee pain/disability.       Baseline: 03/15/2024: 28/80 (80/80 = max disability)  Goal status: INITIAL  4.  Pt will increase strength of Knee flexion/ext to a 4+ grade bilaterally in order to demonstrate improvement in strength and function  Baseline: 03/15/2024:   R L  Knee flexion 4- 4  Knee extension 4- 4   Goal status: INITIAL   PLAN: PT FREQUENCY: 1-2x/week  PT DURATION: 8 weeks  PLANNED INTERVENTIONS: Therapeutic exercises, Therapeutic activity, Neuromuscular re-education, Balance training, Gait training, Patient/Family education, Self  Care, Joint mobilization, Joint manipulation, Vestibular training, Canalith repositioning, Orthotic/Fit training, DME instructions, Dry Needling, Electrical stimulation, Spinal manipulation, Spinal mobilization, Cryotherapy, Moist heat, Taping, Traction, Ultrasound, Ionotophoresis 4mg /ml Dexamethasone, Manual therapy, and Re-evaluation.  PLAN FOR NEXT SESSION: Review HEP, Initiate Knee Strengthening, Hip Strengthening, Knee Flexion stretches, hamstring stretches    Lonni Pall PT, DPT Physical Therapist- [place]  03/15/2024, 1:11 PM

## 2024-03-17 ENCOUNTER — Ambulatory Visit

## 2024-03-17 DIAGNOSIS — M25661 Stiffness of right knee, not elsewhere classified: Secondary | ICD-10-CM

## 2024-03-17 DIAGNOSIS — G8929 Other chronic pain: Secondary | ICD-10-CM

## 2024-03-17 DIAGNOSIS — M25561 Pain in right knee: Secondary | ICD-10-CM | POA: Diagnosis not present

## 2024-03-17 DIAGNOSIS — M6281 Muscle weakness (generalized): Secondary | ICD-10-CM

## 2024-03-17 NOTE — Therapy (Signed)
 OUTPATIENT PHYSICAL THERAPY KNEE TREATMENT  Patient Name: Mercedes White MRN: [id number] DOB:1955/12/16, 68 y.o., female Today's Date: 03/17/2024  END OF SESSION:  PT End of Session - 03/17/24 0945     Visit Number 2    Number of Visits 17    Date for Recertification  05/10/24    PT Start Time 0945    PT Stop Time 1025    PT Time Calculation (min) 40 min    Activity Tolerance Patient tolerated treatment well    Behavior During Therapy [facility] for tasks assessed/performed          Past Medical History:  Diagnosis Date   Allergy    Arthritis    hands    Diverticulosis    Hx of adenomatous polyp of colon 02/17/2019   Migraines    with menses   Past Surgical History:  Procedure Laterality Date   CESAREAN SECTION     x1   COLONOSCOPY  2010   WISDOM TOOTH EXTRACTION     Patient Active Problem List   Diagnosis Date Noted   Establishing care with new doctor, encounter for 03/05/2024   Fall 03/05/2024   Acute pain of right knee 03/05/2024   Hyperlipidemia 03/05/2024   Finger pain, left 11/19/2022   Hx of adenomatous polyp of colon 02/17/2019   Prediabetes 12/02/2017   Osteoarthrosis, generalized, involving multiple sites 11/14/2015   Obesity (BMI 30-39.9) 11/01/2013   Routine general medical examination at a health care facility 11/01/2010   MENOPAUSAL SYNDROME 07/16/2010   Chronic seasonal allergic rhinitis due to pollen 01/14/2008   Premenstrual tension syndrome 01/13/2008    PCP: Vincente Shivers, NP  REFERRING PROVIDER: Vincente Shivers, NP  REFERRING DIAG:  [phone] (ICD-10-CM) - Acute pain of right knee      RATIONALE FOR EVALUATION AND TREATMENT: Rehabilitation  THERAPY DIAG: Chronic pain of right knee  Stiffness of right knee, not elsewhere classified  Muscle weakness (generalized)  ONSET DATE: October 10th 2025  FOLLOW-UP APPT SCHEDULED WITH REFERRING PROVIDER: No    SUBJECTIVE:                                                                                                                                                                                          SUBJECTIVE STATEMENT:    Patient reports to OPPT with a chief concern of L knee pain.   PERTINENT HISTORY:   Patient reports a fall on Oct 10th; she reports that she tripped on the last step (going up), her shoes caught the last step and she fell and sprained her ankle. Patient reports that she has used tylenol and voltaren with  minimal relief. She reports that's a reclined position relieves the pain however as soon as she transitions from reclined to seated the pain returns. Patient uses a recumbent elliptical in order to transition from the recliner. Patient reports that the pain prevents her from prolonged standing, sitting and she has to navigate stairs one step at a time.   Denies b/b, Saddle parasthesia, chills/fever, night sweats, nausea, vomiting,   Imaging:   EXAM: 4 VIEW(S) XRAY OF THE RIGHT KNEE 03/05/2024 09:58:31 AM   COMPARISON: None available.   CLINICAL HISTORY: acute right knee pain x 4 weeks after fall.   FINDINGS:   BONES AND JOINTS: No acute fracture. No focal osseous lesion. No joint dislocation. There is a small joint effusion. There is lateral patellofemoral compartment joint space narrowing compatible with moderate degenerative change. There are also mild degenerative changes at the lateral compartment of the knee with joint space narrowing and osteophyte formation.   SOFT TISSUES: The soft tissues are unremarkable.   IMPRESSION: 1. Small joint effusion. 2. No acute findings. 3. Moderate degenerative change of the knee.   Electronically signed by: Greig Pique MD 03/09/2024 11:31 PM EST RP Workstation: [id number]  PAIN:   Pain Intensity: Present: 0/10, Best: /10, Worst: 8/10 Pain location: R knee  Pain quality: intermittent and sharp  Radiating pain: Yes  Swelling: Yes  Numbness/Tingling: No Focal weakness or buckling:  Yes History of prior back, hip, or knee injury, pain, surgery, or therapy: No  PRECAUTIONS: Fall  WEIGHT BEARING RESTRICTIONS: No  FALLS: Has patient fallen in last 6 months? Yes. Number of falls 1  Living Environment Lives with: lives with their spouse Lives in: House/apartment Stairs: Internal 15 Step; Rail Support: Both   Prior level of function: Independent  Occupational demands: Retired   Hobbies: Taking Care of her dog  PATIENT GOALS: Patient would like to be able to walk without wobbling and get my knee stronger.    OBJECTIVE:   Patient Surveys  LEFS  Extreme difficulty/unable (0), Quite a bit of difficulty (1), Moderate difficulty (2), Little difficulty (3), No difficulty (4) Survey date:  03/15/2024  Any of your usual work, housework or school activities 3  2. Usual hobbies, recreational or sporting activities 2  3. Getting into/out of the bath 0  4. Walking between rooms 4  5. Putting on socks/shoes 3  6. Squatting  1  7. Lifting an object, like a bag of groceries from the floor 3  8. Performing light activities around your home 3  9. Performing heavy activities around your home 1  10. Getting into/out of a car 2  11. Walking 2 blocks 1  12. Walking 1 mile 0  13. Going up/down 10 stairs (1 flight) 3  14. Standing for 1 hour 2  15.  sitting for 1 hour 2  16. Running on even ground 0  17. Running on uneven ground 0  18. Making sharp turns while running fast 0  19. Hopping  0  20. Rolling over in bed 2  Score total:  28/80     Cognition WNL     Gross Musculoskeletal Assessment Tremor: None Bulk: Normal Tone: Normal  GAIT: Distance walked: 10 m Assistive device utilized: None Level of assistance: Complete Independence Comments: Decreased weight bearing on R, reciprocal gait pattern   Posture:  AROM AROM (Normal range in degrees) AROM  Hip Right Left  Flexion (125)    Extension (15)    Abduction (40)    Adduction  Internal Rotation  (45)    External Rotation (45)        Knee    Flexion (135) 100 130  Extension (0) 0 0      Ankle    Dorsiflexion (20)    Plantarflexion (50)    Inversion (35)    Eversion (15    (* = pain; Blank rows = not tested)  LE MMT: MMT (out of 5) Right Left  Hip flexion 4- 4-  Hip extension    Hip abduction    Hip adduction    Hip internal rotation    Hip external rotation    Knee flexion 4- 4  Knee extension 4- 4  Ankle dorsiflexion 4+ 4+  Ankle plantarflexion 5 5  Ankle inversion    Ankle eversion    (* = pain; Blank rows = not tested)  Sensation Grossly intact to light touch bilateral LEs as determined by testing dermatomes L2-S2. Proprioception, and hot/cold testing deferred on this date.  Reflexes R/L Knee Jerk (L3/4): 2+/2+  Ankle Jerk (S1/2): 2+/2+   Muscle Length Hamstrings: R: Positive for tightness    Palpation Location LEFT  RIGHT           Quadriceps  1  Medial Hamstrings  0  Lateral Hamstrings  0  Lateral Hamstring tendon  0  Medial Hamstring tendon  0  Quadriceps tendon  0  Patella    Patellar Tendon  1  Tibial Tuberosity    Medial joint line  0  Lateral joint line  0  MCL  0  LCL  0  Adductor Tubercle    Pes Anserine tendon    Infrapatellar fat pad    Fibular head    Popliteal fossa    (Blank rows = not tested) Graded on 0-4 scale (0 = no pain, 1 = pain, 2 = pain with wincing/grimacing/flinching, 3 = pain with withdrawal, 4 = unwilling to allow palpation), (Blank rows = not tested)  Passive Accessory Motion Deferred  VASCULAR Deferred    SPECIAL TESTS  Ligamentous Stability  ACL: Lachman's: R: Negative  Anterior Drawer: R: Negative   MCL: Valgus Stress (30 degrees flexion): R: Negative  LCL: Varus Stress (30 degrees flexion): R: Negative   Functional Tests:  : .75 m/s 5TSTS: 14.07s  TODAY'S TREATMENT: DATE: 03/17/24  Subjective: Patient reports 2/10 pain in the R knee. No issues with initial HEP. No further  questions or concern.   Therapeutic Exercises:  Ecologist Extension   BLE: 2 x 10 - 10#    RLE: 1 x 10 - 5#   Knee Flexion   BLE: 2 x 10 - 15#     1 x 10 - 20#    Standing Calve Raises   3 x 15 reps   Therapeutic Activity:  NuStep L5-2 x 5 min x UE/LE (Seat 8) for LE endurance and strength; PT manually adjusted resistance throughout within patient tolerance.   Resisted Sit to Stand  2 x 10 - 3 Kg MB @ 21 Mat Table   1 x 10 - 3 Kg MB @ 19 Mat Table   Forward Step Up - BUE Support   R/L: 2 x 10   TRX Squat  2 x 10   PATIENT EDUCATION:  Education details: Exercise Technique Person educated: Patient Education method: Explanation, Demonstration, and Handouts Education comprehension: verbalized understanding and returned demonstration   HOME EXERCISE PROGRAM:  Access Code: [email or web address] URL: [email or web address] Date: 03/17/2024  Prepared by: Lonni Pall  Exercises - Recumbent Bike  - 1 x daily - 3 x weekly - 1 sets - 10 min hold - Seated Piriformis Stretch  - 2 x daily - 7 x weekly - 3 sets - 15- 30s hold - Seated Hamstring Stretch  - 2 x daily - 7 x weekly - 3 sets - 15-30s hold - Seated Knee Flexion Stretch  - 1 x daily - 7 x weekly - 3 sets - 30s hold - Mini Squat with Counter Support  - 2 x daily - 3-4 x weekly - 2 sets - 10-12 reps - Supine Heel Slide with Strap  - 2 x daily - 7 x weekly - 3 sets - 30s hold - Seated Long Arc Quad  - 1 x daily - 3-4 x weekly - 2-3 sets - 10-15 reps  Access Code: [email or web address] URL: [email or web address] Date: 03/15/2024 Prepared by: Lonni Pall  Exercises - Seated Piriformis Stretch  - 2 x daily - 7 x weekly - 3 sets - 15- 30s hold - Seated Piriformis Stretch  - 2 x daily - 7 x weekly - 2-3 sets - 30s hold - Seated Hamstring Stretch  - 2 x daily - 7 x weekly - 3 sets - 15-30s hold - Mini Squat with Counter Support  - 2 x daily - 3-4 x weekly - 2 sets - 10-12  reps  ASSESSMENT:  CLINICAL IMPRESSION: Continued PT POC in management of R knee pain. Functional activities such as squatting, sit to stand and steps addressed in today's session. Patient with decreased knee flexion secondary to pain at end range during squat. Improved knee flexion ROM in today's session. Formally measured at 110 deg in supine following PT interventions. She tolerated all progressions to PT interventions without exacerbation to R knee pain. Patient's R knee flexion still significantly less than L knee secondary to pain. Based on today's performance patient will benefit from skilled PT in order to address current deficits and improve Quality of life.   OBJECTIVE IMPAIRMENTS: Abnormal gait, cardiopulmonary status limiting activity, decreased endurance, difficulty walking, decreased ROM, decreased strength, and pain.   ACTIVITY LIMITATIONS: sitting, standing, squatting, stairs, and transfers  PARTICIPATION LIMITATIONS: community activity and yard work  PERSONAL FACTORS: Age, Past/current experiences, and Time since onset of injury/illness/exacerbation are also affecting patient's functional outcome.   REHAB POTENTIAL: Good  CLINICAL DECISION MAKING: Evolving/moderate complexity  EVALUATION COMPLEXITY: Moderate   GOALS: Goals reviewed with patient? Yes  SHORT TERM GOALS: Target date: 04/14/2024  Pt will be independent with HEP to improve strength and decrease knee pain to improve pain-free function at home and work. Baseline: 03/15/2024: Initial HEP provided Goal status: INITIAL   LONG TERM GOALS: Target date: 05/12/2024  Pt will decrease 5TSTS by at least 3 seconds in order to demonstrate clinically significant improvement in LE strength  Baseline: 03/15/2024: 14.07s Goal status: INITIAL  2.  Pt will decrease worst knee pain by at least 3 points on the NPRS in order to demonstrate clinically significant reduction in knee pain. Baseline: 03/15/2024: 8/10 NPS Goal  status: INITIAL  3.  Pt will increase LEFS score by at least 9 points in order demonstrate clinically significant reduction in knee pain/disability.       Baseline: 03/15/2024: 28/80 (80/80 = max disability)  Goal status: INITIAL  4.  Pt will increase strength of Knee flexion/ext to a 4+ grade bilaterally in order to demonstrate improvement in strength and function  Baseline: 03/15/2024:   R  L  Knee flexion 4- 4  Knee extension 4- 4   Goal status: INITIAL   PLAN: PT FREQUENCY: 1-2x/week  PT DURATION: 8 weeks  PLANNED INTERVENTIONS: Therapeutic exercises, Therapeutic activity, Neuromuscular re-education, Balance training, Gait training, Patient/Family education, Self Care, Joint mobilization, Joint manipulation, Vestibular training, Canalith repositioning, Orthotic/Fit training, DME instructions, Dry Needling, Electrical stimulation, Spinal manipulation, Spinal mobilization, Cryotherapy, Moist heat, Taping, Traction, Ultrasound, Ionotophoresis 4mg /ml Dexamethasone, Manual therapy, and Re-evaluation.  PLAN FOR NEXT SESSION: Review HEP, Initiate Knee Strengthening, Hip Strengthening, Knee Flexion stretches, hamstring stretches    Lonni Pall PT, DPT Physical Therapist- [place]  03/17/2024, 12:21 PM  MRN: [id number] DOB:[birth year], 68 y.o., female Today's Date: 03/17/2024  END OF SESSION:  PT End of Session - 03/17/24 0945     Visit Number 2    Number of Visits 17    Date for Recertification  05/10/24    PT Start Time 0945    PT Stop Time 1025    PT Time Calculation (min) 40 min    Activity Tolerance Patient tolerated treatment well    Behavior During Therapy [facility] for tasks assessed/performed           Past Medical History:  Diagnosis Date   Allergy    Arthritis    hands    Diverticulosis    Hx of adenomatous polyp of colon 02/17/2019   Migraines    with menses   Past Surgical History:  Procedure Laterality Date   CESAREAN SECTION     x1   COLONOSCOPY   2010   WISDOM TOOTH EXTRACTION     Patient Active Problem List   Diagnosis Date Noted   Establishing care with new doctor, encounter for 03/05/2024   Fall 03/05/2024   Acute pain of right knee 03/05/2024   Hyperlipidemia 03/05/2024   Finger pain, left 11/19/2022   Hx of adenomatous polyp of colon 02/17/2019   Prediabetes 12/02/2017   Osteoarthrosis, generalized, involving multiple sites 11/14/2015   Obesity (BMI 30-39.9) 11/01/2013   Routine general medical examination at a health care facility 11/01/2010   MENOPAUSAL SYNDROME 07/16/2010   Chronic seasonal allergic rhinitis due to pollen 01/14/2008   Premenstrual tension syndrome 01/13/2008    PCP: Vincente Shivers, NP  REFERRING PROVIDER: Vincente Shivers, NP  REFERRING DIAG:  [phone] (ICD-10-CM) - Acute pain of right knee      RATIONALE FOR EVALUATION AND TREATMENT: Rehabilitation  THERAPY DIAG: Chronic pain of right knee  Stiffness of right knee, not elsewhere classified  Muscle weakness (generalized)  ONSET DATE: October 10th 2025  FOLLOW-UP APPT SCHEDULED WITH REFERRING PROVIDER: No    SUBJECTIVE:  SUBJECTIVE STATEMENT:    Patient reports to OPPT with a chief concern of L knee pain.   PERTINENT HISTORY:   Patient reports a fall on Oct 10th; she reports that she tripped on the last step (going up), her shoes caught the last step and she fell and sprained her ankle. Patient reports that she has used tylenol and voltaren with minimal relief. She reports that's a reclined position relieves the pain however as soon as she transitions from reclined to seated the pain returns. Patient uses a recumbent elliptical in order to transition from the recliner. Patient reports that the pain prevents her from prolonged standing, sitting and she has to  navigate stairs one step at a time.   Denies b/b, Saddle parasthesia, chills/fever, night sweats, nausea, vomiting,   Imaging:   EXAM: 4 VIEW(S) XRAY OF THE RIGHT KNEE 03/05/2024 09:58:31 AM   COMPARISON: None available.   CLINICAL HISTORY: acute right knee pain x 4 weeks after fall.   FINDINGS:   BONES AND JOINTS: No acute fracture. No focal osseous lesion. No joint dislocation. There is a small joint effusion. There is lateral patellofemoral compartment joint space narrowing compatible with moderate degenerative change. There are also mild degenerative changes at the lateral compartment of the knee with joint space narrowing and osteophyte formation.   SOFT TISSUES: The soft tissues are unremarkable.   IMPRESSION: 1. Small joint effusion. 2. No acute findings. 3. Moderate degenerative change of the knee.   Electronically signed by: Greig Pique MD 03/09/2024 11:31 PM EST RP Workstation: [id number]  PAIN:   Pain Intensity: Present: 0/10, Best: /10, Worst: 8/10 Pain location: R knee  Pain quality: intermittent and sharp  Radiating pain: Yes  Swelling: Yes  Numbness/Tingling: No Focal weakness or buckling: Yes History of prior back, hip, or knee injury, pain, surgery, or therapy: No  PRECAUTIONS: Fall  WEIGHT BEARING RESTRICTIONS: No  FALLS: Has patient fallen in last 6 months? Yes. Number of falls 1  Living Environment Lives with: lives with their spouse Lives in: House/apartment Stairs: Internal 15 Step; Rail Support: Both   Prior level of function: Independent  Occupational demands: Retired   Hobbies: Taking Care of her dog  PATIENT GOALS: Patient would like to be able to walk without wobbling and get my knee stronger.    OBJECTIVE:   Patient Surveys  LEFS  Extreme difficulty/unable (0), Quite a bit of difficulty (1), Moderate difficulty (2), Little difficulty (3), No difficulty (4) Survey date:  03/15/2024  Any of your usual work, housework  or school activities 3  2. Usual hobbies, recreational or sporting activities 2  3. Getting into/out of the bath 0  4. Walking between rooms 4  5. Putting on socks/shoes 3  6. Squatting  1  7. Lifting an object, like a bag of groceries from the floor 3  8. Performing light activities around your home 3  9. Performing heavy activities around your home 1  10. Getting into/out of a car 2  11. Walking 2 blocks 1  12. Walking 1 mile 0  13. Going up/down 10 stairs (1 flight) 3  14. Standing for 1 hour 2  15.  sitting for 1 hour 2  16. Running on even ground 0  17. Running on uneven ground 0  18. Making sharp turns while running fast 0  19. Hopping  0  20. Rolling over in bed 2  Score total:  28/80     Cognition WNL  Gross Musculoskeletal Assessment Tremor: None Bulk: Normal Tone: Normal  GAIT: Distance walked: 10 m Assistive device utilized: None Level of assistance: Complete Independence Comments: Decreased weight bearing on R, reciprocal gait pattern   Posture:  AROM AROM (Normal range in degrees) AROM  Hip Right Left  Flexion (125)    Extension (15)    Abduction (40)    Adduction     Internal Rotation (45)    External Rotation (45)        Knee    Flexion (135) 100 130  Extension (0) 0 0      Ankle    Dorsiflexion (20)    Plantarflexion (50)    Inversion (35)    Eversion (15    (* = pain; Blank rows = not tested)  LE MMT: MMT (out of 5) Right Left  Hip flexion 4- 4-  Hip extension    Hip abduction    Hip adduction    Hip internal rotation    Hip external rotation    Knee flexion 4- 4  Knee extension 4- 4  Ankle dorsiflexion 4+ 4+  Ankle plantarflexion 5 5  Ankle inversion    Ankle eversion    (* = pain; Blank rows = not tested)  Sensation Grossly intact to light touch bilateral LEs as determined by testing dermatomes L2-S2. Proprioception, and hot/cold testing deferred on this date.  Reflexes R/L Knee Jerk (L3/4): 2+/2+  Ankle Jerk  (S1/2): 2+/2+   Muscle Length Hamstrings: R: Positive for tightness    Palpation Location LEFT  RIGHT           Quadriceps  1  Medial Hamstrings  0  Lateral Hamstrings  0  Lateral Hamstring tendon  0  Medial Hamstring tendon  0  Quadriceps tendon  0  Patella    Patellar Tendon  1  Tibial Tuberosity    Medial joint line  0  Lateral joint line  0  MCL  0  LCL  0  Adductor Tubercle    Pes Anserine tendon    Infrapatellar fat pad    Fibular head    Popliteal fossa    (Blank rows = not tested) Graded on 0-4 scale (0 = no pain, 1 = pain, 2 = pain with wincing/grimacing/flinching, 3 = pain with withdrawal, 4 = unwilling to allow palpation), (Blank rows = not tested)  Passive Accessory Motion Deferred  VASCULAR Deferred    SPECIAL TESTS  Ligamentous Stability  ACL: Lachman's: R: Negative  Anterior Drawer: R: Negative   MCL: Valgus Stress (30 degrees flexion): R: Negative  LCL: Varus Stress (30 degrees flexion): R: Negative   Functional Tests:  : .75 m/s 5TSTS: 14.07s  TODAY'S TREATMENT: DATE: 03/17/24  Reviewed HEP with return demonstration:  Access Code: [facility] URL: [email or web address] Date: 03/15/2024 Prepared by: Lonni Pall  Exercises - Seated Piriformis Stretch  - 2 x daily - 7 x weekly - 3 sets - 15- 30s hold - Seated Piriformis Stretch  - 2 x daily - 7 x weekly - 2-3 sets - 30s hold - Seated Hamstring Stretch  - 2 x daily - 7 x weekly - 3 sets - 15-30s hold - Mini Squat with Counter Support  - 2 x daily - 3-4 x weekly - 2 sets - 10-12 reps  PATIENT EDUCATION:  Education details: HEP, POC, Exercise Technique, Person educated: Patient Education method: Explanation, Demonstration, and Handouts Education comprehension: verbalized understanding and returned demonstration   HOME EXERCISE PROGRAM:  Access Code:  [facility] URL: [email or web address] Date: 03/15/2024 Prepared by: Lonni Pall  Exercises -  Seated Piriformis Stretch  - 2 x daily - 7 x weekly - 3 sets - 15- 30s hold - Seated Piriformis Stretch  - 2 x daily - 7 x weekly - 2-3 sets - 30s hold - Seated Hamstring Stretch  - 2 x daily - 7 x weekly - 3 sets - 15-30s hold - Mini Squat with Counter Support  - 2 x daily - 3-4 x weekly - 2 sets - 10-12 reps  ASSESSMENT:  CLINICAL IMPRESSION: Patient is a 68 y.o. female who was seen today for physical therapy evaluation and treatment for R Knee pain following a fall last month. Patient presents with decreased LE strength, decreased ROM and pain in the R knee. Gait affected by knee pain; she presents with decreased weight bearing in R stance. Special tests negative for ligamentous structures. Palpation significant for pain in quadriceps tendon and patellar tendon; concordant pain with knee flexion. R knee flexion significantly less than L knee secondary to pain. Functional tests presenting with decreased gait speed and decreased LE strength (see above at and 5TSTS). Based on today's performance patient will benefit from skilled PT in order to address current deficits and improve Quality of life.   OBJECTIVE IMPAIRMENTS: Abnormal gait, cardiopulmonary status limiting activity, decreased endurance, difficulty walking, decreased ROM, decreased strength, and pain.   ACTIVITY LIMITATIONS: sitting, standing, squatting, stairs, and transfers  PARTICIPATION LIMITATIONS: community activity and yard work  PERSONAL FACTORS: Age, Past/current experiences, and Time since onset of injury/illness/exacerbation are also affecting patient's functional outcome.   REHAB POTENTIAL: Good  CLINICAL DECISION MAKING: Evolving/moderate complexity  EVALUATION COMPLEXITY: Moderate   GOALS: Goals reviewed with patient? Yes  SHORT TERM GOALS: Target date: 04/14/2024  Pt will be independent with HEP to improve strength and decrease knee pain to improve pain-free function at home and work. Baseline:  03/15/2024: Initial HEP provided Goal status: INITIAL   LONG TERM GOALS: Target date: 05/12/2024  Pt will decrease 5TSTS by at least 3 seconds in order to demonstrate clinically significant improvement in LE strength  Baseline: 03/15/2024: 14.07s Goal status: INITIAL  2.  Pt will decrease worst knee pain by at least 3 points on the NPRS in order to demonstrate clinically significant reduction in knee pain. Baseline: 03/15/2024: 8/10 NPS Goal status: INITIAL  3.  Pt will increase LEFS score by at least 9 points in order demonstrate clinically significant reduction in knee pain/disability.       Baseline: 03/15/2024: 28/80 (80/80 = max disability)  Goal status: INITIAL  4.  Pt will increase strength of Knee flexion/ext to a 4+ grade bilaterally in order to demonstrate improvement in strength and function  Baseline: 03/15/2024:   R L  Knee flexion 4- 4  Knee extension 4- 4   Goal status: INITIAL   PLAN: PT FREQUENCY: 1-2x/week  PT DURATION: 8 weeks  PLANNED INTERVENTIONS: Therapeutic exercises, Therapeutic activity, Neuromuscular re-education, Balance training, Gait training, Patient/Family education, Self Care, Joint mobilization, Joint manipulation, Vestibular training, Canalith repositioning, Orthotic/Fit training, DME instructions, Dry Needling, Electrical stimulation, Spinal manipulation, Spinal mobilization, Cryotherapy, Moist heat, Taping, Traction, Ultrasound, Ionotophoresis 4mg /ml Dexamethasone, Manual therapy, and Re-evaluation.  PLAN FOR NEXT SESSION: Review HEP, Progress Knee Strengthening, Hip Strengthening, Knee Flexion stretches, hamstring stretches    Lonni Pall PT, DPT Physical Therapist- [place]  03/17/2024, 12:21 PM

## 2024-03-22 ENCOUNTER — Ambulatory Visit

## 2024-03-22 DIAGNOSIS — G8929 Other chronic pain: Secondary | ICD-10-CM

## 2024-03-22 DIAGNOSIS — M6281 Muscle weakness (generalized): Secondary | ICD-10-CM

## 2024-03-22 DIAGNOSIS — M25661 Stiffness of right knee, not elsewhere classified: Secondary | ICD-10-CM

## 2024-03-22 DIAGNOSIS — M25561 Pain in right knee: Secondary | ICD-10-CM | POA: Diagnosis not present

## 2024-03-22 NOTE — Therapy (Signed)
 OUTPATIENT PHYSICAL THERAPY KNEE TREATMENT  Patient Name: Mercedes White MRN: [id number] DOB:11/11/1955, 68 y.o., female Today's Date: 03/22/2024  END OF SESSION:  PT End of Session - 03/22/24 0814     Visit Number 3    Number of Visits 17    Date for Recertification  05/10/24    PT Start Time 0815    PT Stop Time 0855    PT Time Calculation (min) 40 min    Activity Tolerance Patient tolerated treatment well    Behavior During Therapy [facility] for tasks assessed/performed          Past Medical History:  Diagnosis Date   Allergy    Arthritis    hands    Diverticulosis    Hx of adenomatous polyp of colon 02/17/2019   Migraines    with menses   Past Surgical History:  Procedure Laterality Date   CESAREAN SECTION     x1   COLONOSCOPY  2010   WISDOM TOOTH EXTRACTION     Patient Active Problem List   Diagnosis Date Noted   Establishing care with new doctor, encounter for 03/05/2024   Fall 03/05/2024   Acute pain of right knee 03/05/2024   Hyperlipidemia 03/05/2024   Finger pain, left 11/19/2022   Hx of adenomatous polyp of colon 02/17/2019   Prediabetes 12/02/2017   Osteoarthrosis, generalized, involving multiple sites 11/14/2015   Obesity (BMI 30-39.9) 11/01/2013   Routine general medical examination at a health care facility 11/01/2010   MENOPAUSAL SYNDROME 07/16/2010   Chronic seasonal allergic rhinitis due to pollen 01/14/2008   Premenstrual tension syndrome 01/13/2008    PCP: Vincente Shivers, NP  REFERRING PROVIDER: Vincente Shivers, NP  REFERRING DIAG:  [phone] (ICD-10-CM) - Acute pain of right knee      RATIONALE FOR EVALUATION AND TREATMENT: Rehabilitation  THERAPY DIAG: Chronic pain of right knee  Stiffness of right knee, not elsewhere classified  Muscle weakness (generalized)  Acute pain of right knee  ONSET DATE: October 10th 2025  FOLLOW-UP APPT SCHEDULED WITH REFERRING PROVIDER: No    SUBJECTIVE:                                                                                                                                                                                          SUBJECTIVE STATEMENT:    Patient reports to OPPT with a chief concern of L knee pain.   PERTINENT HISTORY:   Patient reports a fall on Oct 10th; she reports that she tripped on the last step (going up), her shoes caught the last step and she fell and sprained her ankle. Patient reports that she  has used tylenol and voltaren with minimal relief. She reports that's a reclined position relieves the pain however as soon as she transitions from reclined to seated the pain returns. Patient uses a recumbent elliptical in order to transition from the recliner. Patient reports that the pain prevents her from prolonged standing, sitting and she has to navigate stairs one step at a time.   Denies b/b, Saddle parasthesia, chills/fever, night sweats, nausea, vomiting,   Imaging:   EXAM: 4 VIEW(S) XRAY OF THE RIGHT KNEE 03/05/2024 09:58:31 AM   COMPARISON: None available.   CLINICAL HISTORY: acute right knee pain x 4 weeks after fall.   FINDINGS:   BONES AND JOINTS: No acute fracture. No focal osseous lesion. No joint dislocation. There is a small joint effusion. There is lateral patellofemoral compartment joint space narrowing compatible with moderate degenerative change. There are also mild degenerative changes at the lateral compartment of the knee with joint space narrowing and osteophyte formation.   SOFT TISSUES: The soft tissues are unremarkable.   IMPRESSION: 1. Small joint effusion. 2. No acute findings. 3. Moderate degenerative change of the knee.   Electronically signed by: Greig Pique MD 03/09/2024 11:31 PM EST RP Workstation: [id number]  PAIN:   Pain Intensity: Present: 0/10, Best: /10, Worst: 8/10 Pain location: R knee  Pain quality: intermittent and sharp  Radiating pain: Yes  Swelling: Yes  Numbness/Tingling: No Focal  weakness or buckling: Yes History of prior back, hip, or knee injury, pain, surgery, or therapy: No  PRECAUTIONS: Fall  WEIGHT BEARING RESTRICTIONS: No  FALLS: Has patient fallen in last 6 months? Yes. Number of falls 1  Living Environment Lives with: lives with their spouse Lives in: House/apartment Stairs: Internal 15 Step; Rail Support: Both   Prior level of function: Independent  Occupational demands: Retired   Hobbies: Taking Care of her dog  PATIENT GOALS: Patient would like to be able to walk without wobbling and get my knee stronger.    OBJECTIVE:   Patient Surveys  LEFS  Extreme difficulty/unable (0), Quite a bit of difficulty (1), Moderate difficulty (2), Little difficulty (3), No difficulty (4) Survey date:  03/15/2024  Any of your usual work, housework or school activities 3  2. Usual hobbies, recreational or sporting activities 2  3. Getting into/out of the bath 0  4. Walking between rooms 4  5. Putting on socks/shoes 3  6. Squatting  1  7. Lifting an object, like a bag of groceries from the floor 3  8. Performing light activities around your home 3  9. Performing heavy activities around your home 1  10. Getting into/out of a car 2  11. Walking 2 blocks 1  12. Walking 1 mile 0  13. Going up/down 10 stairs (1 flight) 3  14. Standing for 1 hour 2  15.  sitting for 1 hour 2  16. Running on even ground 0  17. Running on uneven ground 0  18. Making sharp turns while running fast 0  19. Hopping  0  20. Rolling over in bed 2  Score total:  28/80     Cognition WNL     Gross Musculoskeletal Assessment Tremor: None Bulk: Normal Tone: Normal  GAIT: Distance walked: 10 m Assistive device utilized: None Level of assistance: Complete Independence Comments: Decreased weight bearing on R, reciprocal gait pattern   Posture:  AROM AROM (Normal range in degrees) AROM  Hip Right Left  Flexion (125)    Extension (15)  Abduction (40)    Adduction      Internal Rotation (45)    External Rotation (45)        Knee    Flexion (135) 100 130  Extension (0) 0 0      Ankle    Dorsiflexion (20)    Plantarflexion (50)    Inversion (35)    Eversion (15    (* = pain; Blank rows = not tested)  LE MMT: MMT (out of 5) Right Left  Hip flexion 4- 4-  Hip extension    Hip abduction    Hip adduction    Hip internal rotation    Hip external rotation    Knee flexion 4- 4  Knee extension 4- 4  Ankle dorsiflexion 4+ 4+  Ankle plantarflexion 5 5  Ankle inversion    Ankle eversion    (* = pain; Blank rows = not tested)  Sensation Grossly intact to light touch bilateral LEs as determined by testing dermatomes L2-S2. Proprioception, and hot/cold testing deferred on this date.  Reflexes R/L Knee Jerk (L3/4): 2+/2+  Ankle Jerk (S1/2): 2+/2+   Muscle Length Hamstrings: R: Positive for tightness    Palpation Location LEFT  RIGHT           Quadriceps  1  Medial Hamstrings  0  Lateral Hamstrings  0  Lateral Hamstring tendon  0  Medial Hamstring tendon  0  Quadriceps tendon  0  Patella    Patellar Tendon  1  Tibial Tuberosity    Medial joint line  0  Lateral joint line  0  MCL  0  LCL  0  Adductor Tubercle    Pes Anserine tendon    Infrapatellar fat pad    Fibular head    Popliteal fossa    (Blank rows = not tested) Graded on 0-4 scale (0 = no pain, 1 = pain, 2 = pain with wincing/grimacing/flinching, 3 = pain with withdrawal, 4 = unwilling to allow palpation), (Blank rows = not tested)  Passive Accessory Motion Deferred  VASCULAR Deferred    SPECIAL TESTS  Ligamentous Stability  ACL: Lachman's: R: Negative  Anterior Drawer: R: Negative   MCL: Valgus Stress (30 degrees flexion): R: Negative  LCL: Varus Stress (30 degrees flexion): R: Negative   Functional Tests:  : .75 m/s 5TSTS: 14.07s  TODAY'S TREATMENT: DATE: 03/22/24  Subjective: Patient reports 6/10 NPS in the R Knee. Persistent pain  noted with knee flexion in Supine position using the towel. No question or concerns.   Therapeutic Exercises:  Omega Cable Machine    Knee Extension   3 x 10 - 15#   Knee Flexion   3 x 10 - 20#   Hip Matrix   R/L: 2 x 10 - 25#  L Sidelying Clamshell   R: 2 x 10 - PT resistance at knee in second set  Therapeutic Activity:  Matrix Exercise Bike Level 4-2 x 5 min (Seat 11) for LE strength and endurance for walking capacity; PT manually adjusted resistance throughout per patient tolerance  Resisted Sit to Stand  3 x 10 - 3 Kg MB @ 21 Mat Table   Forward Step Up - BUE Support   R/L: 2 x 10   PATIENT EDUCATION:  Education details: Exercise Technique Person educated: Patient Education method: Explanation, Demonstration, and Handouts Education comprehension: verbalized understanding and returned demonstration   HOME EXERCISE PROGRAM:  Access Code: [email or web address] URL: [email or web address] Date: 03/17/2024 Prepared by: Lonni Pall  Exercises - Recumbent Bike  - 1 x daily - 3 x weekly - 1 sets - 10 min hold - Seated Piriformis Stretch  - 2 x daily - 7 x weekly - 3 sets - 15- 30s hold - Seated Hamstring Stretch  - 2 x daily - 7 x weekly - 3 sets - 15-30s hold - Seated Knee Flexion Stretch  - 1 x daily - 7 x weekly - 3 sets - 30s hold - Mini Squat with Counter Support  - 2 x daily - 3-4 x weekly - 2 sets - 10-12 reps - Supine Heel Slide with Strap  - 2 x daily - 7 x weekly - 3 sets - 30s hold - Seated Long Arc Quad  - 1 x daily - 3-4 x weekly - 2-3 sets - 10-15 reps  Access Code: [email or web address] URL: [email or web address] Date: 03/15/2024 Prepared by: Lonni Pall  Exercises - Seated Piriformis Stretch  - 2 x daily - 7 x weekly - 3 sets - 15- 30s hold - Seated Piriformis Stretch  - 2 x daily - 7 x weekly - 2-3 sets - 30s hold - Seated Hamstring Stretch  - 2 x daily - 7 x weekly - 3 sets - 15-30s hold - Mini Squat with Counter Support  - 2 x daily -  3-4 x weekly - 2 sets - 10-12 reps  ASSESSMENT:  CLINICAL IMPRESSION: Continued PT POC in management of R knee pain. Patient tolerated all progressions to PT interventions without additional pain in the R knee. PT focused on hip strengthening for increased knee stability. Patient vocalized improvements in knee pain following PT session. Updated HEP to include step up exercise for increased quadricep strength. Reviewed knee flexion stretch with ankle strap, slight improvements in knee flexion angle using strap (120 deg). Patient's R knee flexion still significantly less than L knee secondary to pain. Based on today's performance patient will benefit from skilled PT in order to address current deficits and improve Quality of life.     OBJECTIVE IMPAIRMENTS: Abnormal gait, cardiopulmonary status limiting activity, decreased endurance, difficulty walking, decreased ROM, decreased strength, and pain.   ACTIVITY LIMITATIONS: sitting, standing, squatting, stairs, and transfers  PARTICIPATION LIMITATIONS: community activity and yard work  PERSONAL FACTORS: Age, Past/current experiences, and Time since onset of injury/illness/exacerbation are also affecting patient's functional outcome.   REHAB POTENTIAL: Good  CLINICAL DECISION MAKING: Evolving/moderate complexity  EVALUATION COMPLEXITY: Moderate   GOALS: Goals reviewed with patient? Yes  SHORT TERM GOALS: Target date: 04/19/2024  Pt will be independent with HEP to improve strength and decrease knee pain to improve pain-free function at home and work. Baseline: 03/15/2024: Initial HEP provided Goal status: INITIAL   LONG TERM GOALS: Target date: 05/17/2024  Pt will decrease 5TSTS by at least 3 seconds in order to demonstrate clinically significant improvement in LE strength  Baseline: 03/15/2024: 14.07s Goal status: INITIAL  2.  Pt will decrease worst knee pain by at least 3 points on the NPRS in order to demonstrate clinically  significant reduction in knee pain. Baseline: 03/15/2024: 8/10 NPS Goal status: INITIAL  3.  Pt will increase LEFS score by at least 9 points in order demonstrate clinically significant reduction in knee pain/disability.       Baseline: 03/15/2024: 28/80 (80/80 = max disability)  Goal status: INITIAL  4.  Pt will increase strength of Knee flexion/ext to a 4+ grade bilaterally in order to demonstrate improvement in strength and function  Baseline: 03/15/2024:   R  L  Knee flexion 4- 4  Knee extension 4- 4   Goal status: INITIAL   PLAN: PT FREQUENCY: 1-2x/week  PT DURATION: 8 weeks  PLANNED INTERVENTIONS: Therapeutic exercises, Therapeutic activity, Neuromuscular re-education, Balance training, Gait training, Patient/Family education, Self Care, Joint mobilization, Joint manipulation, Vestibular training, Canalith repositioning, Orthotic/Fit training, DME instructions, Dry Needling, Electrical stimulation, Spinal manipulation, Spinal mobilization, Cryotherapy, Moist heat, Taping, Traction, Ultrasound, Ionotophoresis 4mg /ml Dexamethasone, Manual therapy, and Re-evaluation.  PLAN FOR NEXT SESSION: Review HEP, Initiate Knee Strengthening, Hip Strengthening, Knee Flexion stretches, hamstring stretches    Lonni Pall PT, DPT Physical Therapist- [place]  03/22/2024, 8:43 AM  MRN: [id number] DOB:09-Nov-1955, 68 y.o., female Today's Date: 03/22/2024  END OF SESSION:  PT End of Session - 03/22/24 0814     Visit Number 3    Number of Visits 17    Date for Recertification  05/10/24    PT Start Time 0815    PT Stop Time 0855    PT Time Calculation (min) 40 min    Activity Tolerance Patient tolerated treatment well    Behavior During Therapy [facility] for tasks assessed/performed           Past Medical History:  Diagnosis Date   Allergy    Arthritis    hands    Diverticulosis    Hx of adenomatous polyp of colon 02/17/2019   Migraines    with menses   Past Surgical History:   Procedure Laterality Date   CESAREAN SECTION     x1   COLONOSCOPY  2010   WISDOM TOOTH EXTRACTION     Patient Active Problem List   Diagnosis Date Noted   Establishing care with new doctor, encounter for 03/05/2024   Fall 03/05/2024   Acute pain of right knee 03/05/2024   Hyperlipidemia 03/05/2024   Finger pain, left 11/19/2022   Hx of adenomatous polyp of colon 02/17/2019   Prediabetes 12/02/2017   Osteoarthrosis, generalized, involving multiple sites 11/14/2015   Obesity (BMI 30-39.9) 11/01/2013   Routine general medical examination at a health care facility 11/01/2010   MENOPAUSAL SYNDROME 07/16/2010   Chronic seasonal allergic rhinitis due to pollen 01/14/2008   Premenstrual tension syndrome 01/13/2008    PCP: Vincente Shivers, NP  REFERRING PROVIDER: Vincente Shivers, NP  REFERRING DIAG:  [phone] (ICD-10-CM) - Acute pain of right knee      RATIONALE FOR EVALUATION AND TREATMENT: Rehabilitation  THERAPY DIAG: Chronic pain of right knee  Stiffness of right knee, not elsewhere classified  Muscle weakness (generalized)  Acute pain of right knee  ONSET DATE: October 10th 2025  FOLLOW-UP APPT SCHEDULED WITH REFERRING PROVIDER: No    SUBJECTIVE:  SUBJECTIVE STATEMENT:    Patient reports to OPPT with a chief concern of L knee pain.   PERTINENT HISTORY:   Patient reports a fall on Oct 10th; she reports that she tripped on the last step (going up), her shoes caught the last step and she fell and sprained her ankle. Patient reports that she has used tylenol and voltaren with minimal relief. She reports that's a reclined position relieves the pain however as soon as she transitions from reclined to seated the pain returns. Patient uses a recumbent elliptical in order to transition from the  recliner. Patient reports that the pain prevents her from prolonged standing, sitting and she has to navigate stairs one step at a time.   Denies b/b, Saddle parasthesia, chills/fever, night sweats, nausea, vomiting,   Imaging:   EXAM: 4 VIEW(S) XRAY OF THE RIGHT KNEE 03/05/2024 09:58:31 AM   COMPARISON: None available.   CLINICAL HISTORY: acute right knee pain x 4 weeks after fall.   FINDINGS:   BONES AND JOINTS: No acute fracture. No focal osseous lesion. No joint dislocation. There is a small joint effusion. There is lateral patellofemoral compartment joint space narrowing compatible with moderate degenerative change. There are also mild degenerative changes at the lateral compartment of the knee with joint space narrowing and osteophyte formation.   SOFT TISSUES: The soft tissues are unremarkable.   IMPRESSION: 1. Small joint effusion. 2. No acute findings. 3. Moderate degenerative change of the knee.   Electronically signed by: Greig Pique MD 03/09/2024 11:31 PM EST RP Workstation: [id number]  PAIN:   Pain Intensity: Present: 0/10, Best: /10, Worst: 8/10 Pain location: R knee  Pain quality: intermittent and sharp  Radiating pain: Yes  Swelling: Yes  Numbness/Tingling: No Focal weakness or buckling: Yes History of prior back, hip, or knee injury, pain, surgery, or therapy: No  PRECAUTIONS: Fall  WEIGHT BEARING RESTRICTIONS: No  FALLS: Has patient fallen in last 6 months? Yes. Number of falls 1  Living Environment Lives with: lives with their spouse Lives in: House/apartment Stairs: Internal 15 Step; Rail Support: Both   Prior level of function: Independent  Occupational demands: Retired   Hobbies: Taking Care of her dog  PATIENT GOALS: Patient would like to be able to walk without wobbling and get my knee stronger.    OBJECTIVE:   Patient Surveys  LEFS  Extreme difficulty/unable (0), Quite a bit of difficulty (1), Moderate difficulty (2),  Little difficulty (3), No difficulty (4) Survey date:  03/15/2024  Any of your usual work, housework or school activities 3  2. Usual hobbies, recreational or sporting activities 2  3. Getting into/out of the bath 0  4. Walking between rooms 4  5. Putting on socks/shoes 3  6. Squatting  1  7. Lifting an object, like a bag of groceries from the floor 3  8. Performing light activities around your home 3  9. Performing heavy activities around your home 1  10. Getting into/out of a car 2  11. Walking 2 blocks 1  12. Walking 1 mile 0  13. Going up/down 10 stairs (1 flight) 3  14. Standing for 1 hour 2  15.  sitting for 1 hour 2  16. Running on even ground 0  17. Running on uneven ground 0  18. Making sharp turns while running fast 0  19. Hopping  0  20. Rolling over in bed 2  Score total:  28/80     Cognition WNL  Gross Musculoskeletal Assessment Tremor: None Bulk: Normal Tone: Normal  GAIT: Distance walked: 10 m Assistive device utilized: None Level of assistance: Complete Independence Comments: Decreased weight bearing on R, reciprocal gait pattern   Posture:  AROM AROM (Normal range in degrees) AROM  Hip Right Left  Flexion (125)    Extension (15)    Abduction (40)    Adduction     Internal Rotation (45)    External Rotation (45)        Knee    Flexion (135) 100 130  Extension (0) 0 0      Ankle    Dorsiflexion (20)    Plantarflexion (50)    Inversion (35)    Eversion (15    (* = pain; Blank rows = not tested)  LE MMT: MMT (out of 5) Right Left  Hip flexion 4- 4-  Hip extension    Hip abduction    Hip adduction    Hip internal rotation    Hip external rotation    Knee flexion 4- 4  Knee extension 4- 4  Ankle dorsiflexion 4+ 4+  Ankle plantarflexion 5 5  Ankle inversion    Ankle eversion    (* = pain; Blank rows = not tested)  Sensation Grossly intact to light touch bilateral LEs as determined by testing dermatomes L2-S2.  Proprioception, and hot/cold testing deferred on this date.  Reflexes R/L Knee Jerk (L3/4): 2+/2+  Ankle Jerk (S1/2): 2+/2+   Muscle Length Hamstrings: R: Positive for tightness    Palpation Location LEFT  RIGHT           Quadriceps  1  Medial Hamstrings  0  Lateral Hamstrings  0  Lateral Hamstring tendon  0  Medial Hamstring tendon  0  Quadriceps tendon  0  Patella    Patellar Tendon  1  Tibial Tuberosity    Medial joint line  0  Lateral joint line  0  MCL  0  LCL  0  Adductor Tubercle    Pes Anserine tendon    Infrapatellar fat pad    Fibular head    Popliteal fossa    (Blank rows = not tested) Graded on 0-4 scale (0 = no pain, 1 = pain, 2 = pain with wincing/grimacing/flinching, 3 = pain with withdrawal, 4 = unwilling to allow palpation), (Blank rows = not tested)  Passive Accessory Motion Deferred  VASCULAR Deferred    SPECIAL TESTS  Ligamentous Stability  ACL: Lachman's: R: Negative  Anterior Drawer: R: Negative   MCL: Valgus Stress (30 degrees flexion): R: Negative  LCL: Varus Stress (30 degrees flexion): R: Negative   Functional Tests:  : .75 m/s 5TSTS: 14.07s  TODAY'S TREATMENT: DATE: 03/22/24  Subjective: Patient reports 6/10 pain in the R knee. Persistent stiffness and she reports that she has pain with knee flexion. No further questions or concerns.   Therapeutic Exercise:  Cable Machine    Knee Extension    3 x 10 - 15#    Knee Flexion    3 x 10 - 20#    Hip Abduction Matrix    R/L: 2 x 10 - 25#   Therapeutic Activity:   PATIENT EDUCATION:  Education details: Exercise Technique  Person educated: Patient Education method: Explanation, Demonstration, and Handouts Education comprehension: verbalized understanding and returned demonstration   HOME EXERCISE PROGRAM:  Access Code: [email or web address] URL: [email or web address] Date: 03/22/2024 Prepared by: Lonni Pall  Exercises - Recumbent Bike  - 1 x daily -  3 x weekly - 1 sets - 10 min hold - Seated Piriformis Stretch  - 2 x daily - 7 x weekly - 3 sets - 15- 30s hold - Seated Hamstring Stretch  - 2 x daily - 7 x weekly - 3 sets - 15-30s hold - Seated Knee Flexion Stretch  - 1 x daily - 7 x weekly - 3 sets - 30s hold - Mini Squat with Counter Support  - 2 x daily - 3-4 x weekly - 2 sets - 10-12 reps - Supine Heel Slide with Strap  - 2 x daily - 7 x weekly - 3 sets - 30s hold - Seated Long Arc Quad  - 1 x daily - 3-4 x weekly - 2-3 sets - 10-15 reps  Access Code: [email or web address] URL: [email or web address] Date: 03/15/2024 Prepared by: Lonni Pall  Exercises - Seated Piriformis Stretch  - 2 x daily - 7 x weekly - 3 sets - 15- 30s hold - Seated Piriformis Stretch  - 2 x daily - 7 x weekly - 2-3 sets - 30s hold - Seated Hamstring Stretch  - 2 x daily - 7 x weekly - 3 sets - 15-30s hold - Mini Squat with Counter Support  - 2 x daily - 3-4 x weekly - 2 sets - 10-12 reps  ASSESSMENT:  CLINICAL IMPRESSION: Patient reports to OPPT for continued management of R knee pain. She tolerated all progressions to PT interventions without report of additional pain in the R knee.   OBJECTIVE IMPAIRMENTS: Abnormal gait, cardiopulmonary status limiting activity, decreased endurance, difficulty walking, decreased ROM, decreased strength, and pain.   ACTIVITY LIMITATIONS: sitting, standing, squatting, stairs, and transfers  PARTICIPATION LIMITATIONS: community activity and yard work  PERSONAL FACTORS: Age, Past/current experiences, and Time since onset of injury/illness/exacerbation are also affecting patient's functional outcome.   REHAB POTENTIAL: Good  CLINICAL DECISION MAKING: Evolving/moderate complexity  EVALUATION COMPLEXITY: Moderate   GOALS: Goals reviewed with patient? Yes  SHORT TERM GOALS: Target date: 04/19/2024  Pt will be independent with HEP to improve strength and decrease knee pain to improve pain-free function at home and  work. Baseline: 03/15/2024: Initial HEP provided Goal status: INITIAL   LONG TERM GOALS: Target date: 05/17/2024  Pt will decrease 5TSTS by at least 3 seconds in order to demonstrate clinically significant improvement in LE strength  Baseline: 03/15/2024: 14.07s Goal status: INITIAL  2.  Pt will decrease worst knee pain by at least 3 points on the NPRS in order to demonstrate clinically significant reduction in knee pain. Baseline: 03/15/2024: 8/10 NPS Goal status: INITIAL  3.  Pt will increase LEFS score by at least 9 points in order demonstrate clinically significant reduction in knee pain/disability.       Baseline: 03/15/2024: 28/80 (80/80 = max disability)  Goal status: INITIAL  4.  Pt will increase strength of Knee flexion/ext to a 4+ grade bilaterally in order to demonstrate improvement in strength and function  Baseline: 03/15/2024:   R L  Knee flexion 4- 4  Knee extension 4- 4   Goal status: INITIAL   PLAN: PT FREQUENCY: 1-2x/week  PT DURATION: 8 weeks  PLANNED INTERVENTIONS: Therapeutic exercises, Therapeutic activity, Neuromuscular re-education, Balance training, Gait training, Patient/Family education, Self Care, Joint mobilization, Joint manipulation, Vestibular training, Canalith repositioning, Orthotic/Fit training, DME instructions, Dry Needling, Electrical stimulation, Spinal manipulation, Spinal mobilization, Cryotherapy, Moist heat, Taping, Traction, Ultrasound, Ionotophoresis 4mg /ml Dexamethasone, Manual therapy, and Re-evaluation.  PLAN FOR NEXT SESSION: Review HEP, Progress Knee Strengthening, Hip Strengthening,  Knee Flexion stretches, hamstring stretches    Lonni Pall PT, DPT Physical Therapist- [place]  03/22/2024, 8:43 AM

## 2024-03-22 NOTE — Therapy (Deleted)
 OUTPATIENT PHYSICAL THERAPY KNEE TREATMENT  Patient Name: Mercedes White MRN: [id number] DOB:1955/08/06, 68 y.o., female Today's Date: 03/22/2024  END OF SESSION:  PT End of Session - 03/22/24 0814     Visit Number 3    Number of Visits 17    Date for Recertification  05/10/24    PT Start Time 0815    PT Stop Time 0855    PT Time Calculation (min) 40 min    Activity Tolerance Patient tolerated treatment well    Behavior During Therapy [facility] for tasks assessed/performed          Past Medical History:  Diagnosis Date   Allergy    Arthritis    hands    Diverticulosis    Hx of adenomatous polyp of colon 02/17/2019   Migraines    with menses   Past Surgical History:  Procedure Laterality Date   CESAREAN SECTION     x1   COLONOSCOPY  2010   WISDOM TOOTH EXTRACTION     Patient Active Problem List   Diagnosis Date Noted   Establishing care with new doctor, encounter for 03/05/2024   Fall 03/05/2024   Acute pain of right knee 03/05/2024   Hyperlipidemia 03/05/2024   Finger pain, left 11/19/2022   Hx of adenomatous polyp of colon 02/17/2019   Prediabetes 12/02/2017   Osteoarthrosis, generalized, involving multiple sites 11/14/2015   Obesity (BMI 30-39.9) 11/01/2013   Routine general medical examination at a health care facility 11/01/2010   MENOPAUSAL SYNDROME 07/16/2010   Chronic seasonal allergic rhinitis due to pollen 01/14/2008   Premenstrual tension syndrome 01/13/2008    PCP: Vincente Shivers, NP  REFERRING PROVIDER: Vincente Shivers, NP  REFERRING DIAG:  [phone] (ICD-10-CM) - Acute pain of right knee      RATIONALE FOR EVALUATION AND TREATMENT: Rehabilitation  THERAPY DIAG: Chronic pain of right knee  Stiffness of right knee, not elsewhere classified  Muscle weakness (generalized)  Acute pain of right knee  ONSET DATE: October 10th 2025  FOLLOW-UP APPT SCHEDULED WITH REFERRING PROVIDER: No    SUBJECTIVE:                                                                                                                                                                                          SUBJECTIVE STATEMENT:    Patient reports to OPPT with a chief concern of L knee pain.   PERTINENT HISTORY:   Patient reports a fall on Oct 10th; she reports that she tripped on the last step (going up), her shoes caught the last step and she fell and sprained her ankle. Patient reports that she  has used tylenol and voltaren with minimal relief. She reports that's a reclined position relieves the pain however as soon as she transitions from reclined to seated the pain returns. Patient uses a recumbent elliptical in order to transition from the recliner. Patient reports that the pain prevents her from prolonged standing, sitting and she has to navigate stairs one step at a time.   Denies b/b, Saddle parasthesia, chills/fever, night sweats, nausea, vomiting,   Imaging:   EXAM: 4 VIEW(S) XRAY OF THE RIGHT KNEE 03/05/2024 09:58:31 AM   COMPARISON: None available.   CLINICAL HISTORY: acute right knee pain x 4 weeks after fall.   FINDINGS:   BONES AND JOINTS: No acute fracture. No focal osseous lesion. No joint dislocation. There is a small joint effusion. There is lateral patellofemoral compartment joint space narrowing compatible with moderate degenerative change. There are also mild degenerative changes at the lateral compartment of the knee with joint space narrowing and osteophyte formation.   SOFT TISSUES: The soft tissues are unremarkable.   IMPRESSION: 1. Small joint effusion. 2. No acute findings. 3. Moderate degenerative change of the knee.   Electronically signed by: Greig Pique MD 03/09/2024 11:31 PM EST RP Workstation: [id number]  PAIN:   Pain Intensity: Present: 0/10, Best: /10, Worst: 8/10 Pain location: R knee  Pain quality: intermittent and sharp  Radiating pain: Yes  Swelling: Yes  Numbness/Tingling: No Focal  weakness or buckling: Yes History of prior back, hip, or knee injury, pain, surgery, or therapy: No  PRECAUTIONS: Fall  WEIGHT BEARING RESTRICTIONS: No  FALLS: Has patient fallen in last 6 months? Yes. Number of falls 1  Living Environment Lives with: lives with their spouse Lives in: House/apartment Stairs: Internal 15 Step; Rail Support: Both   Prior level of function: Independent  Occupational demands: Retired   Hobbies: Taking Care of her dog  PATIENT GOALS: Patient would like to be able to walk without wobbling and get my knee stronger.    OBJECTIVE:   Patient Surveys  LEFS  Extreme difficulty/unable (0), Quite a bit of difficulty (1), Moderate difficulty (2), Little difficulty (3), No difficulty (4) Survey date:  03/15/2024  Any of your usual work, housework or school activities 3  2. Usual hobbies, recreational or sporting activities 2  3. Getting into/out of the bath 0  4. Walking between rooms 4  5. Putting on socks/shoes 3  6. Squatting  1  7. Lifting an object, like a bag of groceries from the floor 3  8. Performing light activities around your home 3  9. Performing heavy activities around your home 1  10. Getting into/out of a car 2  11. Walking 2 blocks 1  12. Walking 1 mile 0  13. Going up/down 10 stairs (1 flight) 3  14. Standing for 1 hour 2  15.  sitting for 1 hour 2  16. Running on even ground 0  17. Running on uneven ground 0  18. Making sharp turns while running fast 0  19. Hopping  0  20. Rolling over in bed 2  Score total:  28/80     Cognition WNL     Gross Musculoskeletal Assessment Tremor: None Bulk: Normal Tone: Normal  GAIT: Distance walked: 10 m Assistive device utilized: None Level of assistance: Complete Independence Comments: Decreased weight bearing on R, reciprocal gait pattern   Posture:  AROM AROM (Normal range in degrees) AROM  Hip Right Left  Flexion (125)    Extension (15)  Abduction (40)    Adduction      Internal Rotation (45)    External Rotation (45)        Knee    Flexion (135) 100 130  Extension (0) 0 0      Ankle    Dorsiflexion (20)    Plantarflexion (50)    Inversion (35)    Eversion (15    (* = pain; Blank rows = not tested)  LE MMT: MMT (out of 5) Right Left  Hip flexion 4- 4-  Hip extension    Hip abduction    Hip adduction    Hip internal rotation    Hip external rotation    Knee flexion 4- 4  Knee extension 4- 4  Ankle dorsiflexion 4+ 4+  Ankle plantarflexion 5 5  Ankle inversion    Ankle eversion    (* = pain; Blank rows = not tested)  Sensation Grossly intact to light touch bilateral LEs as determined by testing dermatomes L2-S2. Proprioception, and hot/cold testing deferred on this date.  Reflexes R/L Knee Jerk (L3/4): 2+/2+  Ankle Jerk (S1/2): 2+/2+   Muscle Length Hamstrings: R: Positive for tightness    Palpation Location LEFT  RIGHT           Quadriceps  1  Medial Hamstrings  0  Lateral Hamstrings  0  Lateral Hamstring tendon  0  Medial Hamstring tendon  0  Quadriceps tendon  0  Patella    Patellar Tendon  1  Tibial Tuberosity    Medial joint line  0  Lateral joint line  0  MCL  0  LCL  0  Adductor Tubercle    Pes Anserine tendon    Infrapatellar fat pad    Fibular head    Popliteal fossa    (Blank rows = not tested) Graded on 0-4 scale (0 = no pain, 1 = pain, 2 = pain with wincing/grimacing/flinching, 3 = pain with withdrawal, 4 = unwilling to allow palpation), (Blank rows = not tested)  Passive Accessory Motion Deferred  VASCULAR Deferred    SPECIAL TESTS  Ligamentous Stability  ACL: Lachman's: R: Negative  Anterior Drawer: R: Negative   MCL: Valgus Stress (30 degrees flexion): R: Negative  LCL: Varus Stress (30 degrees flexion): R: Negative   Functional Tests:  : .75 m/s 5TSTS: 14.07s  TODAY'S TREATMENT: DATE: 03/22/24  Subjective: Patient reports 2/10 pain in the R knee. No issues with  initial HEP. No further questions or concern.   Therapeutic Exercises:  Ecologist Extension   BLE: 2 x 10 - 10#    RLE: 1 x 10 - 5#   Knee Flexion   BLE: 2 x 10 - 15#     1 x 10 - 20#    Standing Calve Raises   3 x 15 reps   Therapeutic Activity:  NuStep L5-2 x 5 min x UE/LE (Seat 8) for LE endurance and strength; PT manually adjusted resistance throughout within patient tolerance.   Resisted Sit to Stand  2 x 10 - 3 Kg MB @ 21 Mat Table   1 x 10 - 3 Kg MB @ 19 Mat Table   Forward Step Up - BUE Support   R/L: 2 x 10   TRX Squat  2 x 10   PATIENT EDUCATION:  Education details: Exercise Technique Person educated: Patient Education method: Explanation, Demonstration, and Handouts Education comprehension: verbalized understanding and returned demonstration   HOME  EXERCISE PROGRAM:  Access Code: [email or web address] URL: [email or web address] Date: 03/17/2024 Prepared by: Lonni Pall  Exercises - Recumbent Bike  - 1 x daily - 3 x weekly - 1 sets - 10 min hold - Seated Piriformis Stretch  - 2 x daily - 7 x weekly - 3 sets - 15- 30s hold - Seated Hamstring Stretch  - 2 x daily - 7 x weekly - 3 sets - 15-30s hold - Seated Knee Flexion Stretch  - 1 x daily - 7 x weekly - 3 sets - 30s hold - Mini Squat with Counter Support  - 2 x daily - 3-4 x weekly - 2 sets - 10-12 reps - Supine Heel Slide with Strap  - 2 x daily - 7 x weekly - 3 sets - 30s hold - Seated Long Arc Quad  - 1 x daily - 3-4 x weekly - 2-3 sets - 10-15 reps  Access Code: [email or web address] URL: [email or web address] Date: 03/15/2024 Prepared by: Lonni Pall  Exercises - Seated Piriformis Stretch  - 2 x daily - 7 x weekly - 3 sets - 15- 30s hold - Seated Piriformis Stretch  - 2 x daily - 7 x weekly - 2-3 sets - 30s hold - Seated Hamstring Stretch  - 2 x daily - 7 x weekly - 3 sets - 15-30s hold - Mini Squat with Counter Support  - 2 x daily - 3-4 x weekly - 2 sets -  10-12 reps  ASSESSMENT:  CLINICAL IMPRESSION: Continued PT POC in management of R knee pain. Functional activities such as squatting, sit to stand and steps addressed in today's session. Patient with decreased knee flexion secondary to pain at end range during squat. Improved knee flexion ROM in today's session. Formally measured at 110 deg in supine following PT interventions. She tolerated all progressions to PT interventions without exacerbation to R knee pain. Patient's R knee flexion still significantly less than L knee secondary to pain. Based on today's performance patient will benefit from skilled PT in order to address current deficits and improve Quality of life.   OBJECTIVE IMPAIRMENTS: Abnormal gait, cardiopulmonary status limiting activity, decreased endurance, difficulty walking, decreased ROM, decreased strength, and pain.   ACTIVITY LIMITATIONS: sitting, standing, squatting, stairs, and transfers  PARTICIPATION LIMITATIONS: community activity and yard work  PERSONAL FACTORS: Age, Past/current experiences, and Time since onset of injury/illness/exacerbation are also affecting patient's functional outcome.   REHAB POTENTIAL: Good  CLINICAL DECISION MAKING: Evolving/moderate complexity  EVALUATION COMPLEXITY: Moderate   GOALS: Goals reviewed with patient? Yes  SHORT TERM GOALS: Target date: 04/19/2024  Pt will be independent with HEP to improve strength and decrease knee pain to improve pain-free function at home and work. Baseline: 03/15/2024: Initial HEP provided Goal status: INITIAL   LONG TERM GOALS: Target date: 05/17/2024  Pt will decrease 5TSTS by at least 3 seconds in order to demonstrate clinically significant improvement in LE strength  Baseline: 03/15/2024: 14.07s Goal status: INITIAL  2.  Pt will decrease worst knee pain by at least 3 points on the NPRS in order to demonstrate clinically significant reduction in knee pain. Baseline: 03/15/2024: 8/10  NPS Goal status: INITIAL  3.  Pt will increase LEFS score by at least 9 points in order demonstrate clinically significant reduction in knee pain/disability.       Baseline: 03/15/2024: 28/80 (80/80 = max disability)  Goal status: INITIAL  4.  Pt will increase strength of Knee flexion/ext to a 4+ grade bilaterally in order to demonstrate improvement  in strength and function  Baseline: 03/15/2024:   R L  Knee flexion 4- 4  Knee extension 4- 4   Goal status: INITIAL   PLAN: PT FREQUENCY: 1-2x/week  PT DURATION: 8 weeks  PLANNED INTERVENTIONS: Therapeutic exercises, Therapeutic activity, Neuromuscular re-education, Balance training, Gait training, Patient/Family education, Self Care, Joint mobilization, Joint manipulation, Vestibular training, Canalith repositioning, Orthotic/Fit training, DME instructions, Dry Needling, Electrical stimulation, Spinal manipulation, Spinal mobilization, Cryotherapy, Moist heat, Taping, Traction, Ultrasound, Ionotophoresis 4mg /ml Dexamethasone, Manual therapy, and Re-evaluation.  PLAN FOR NEXT SESSION: Review HEP, Initiate Knee Strengthening, Hip Strengthening, Knee Flexion stretches, hamstring stretches    Lonni Pall PT, DPT Physical Therapist- [place]  03/22/2024, 8:16 AM  MRN: [id number] DOB:[birth year], 68 y.o., female Today's Date: 03/22/2024  END OF SESSION:  PT End of Session - 03/22/24 0814     Visit Number 3    Number of Visits 17    Date for Recertification  05/10/24    PT Start Time 0815    PT Stop Time 0855    PT Time Calculation (min) 40 min    Activity Tolerance Patient tolerated treatment well    Behavior During Therapy [facility] for tasks assessed/performed           Past Medical History:  Diagnosis Date   Allergy    Arthritis    hands    Diverticulosis    Hx of adenomatous polyp of colon 02/17/2019   Migraines    with menses   Past Surgical History:  Procedure Laterality Date   CESAREAN SECTION     x1    COLONOSCOPY  2010   WISDOM TOOTH EXTRACTION     Patient Active Problem List   Diagnosis Date Noted   Establishing care with new doctor, encounter for 03/05/2024   Fall 03/05/2024   Acute pain of right knee 03/05/2024   Hyperlipidemia 03/05/2024   Finger pain, left 11/19/2022   Hx of adenomatous polyp of colon 02/17/2019   Prediabetes 12/02/2017   Osteoarthrosis, generalized, involving multiple sites 11/14/2015   Obesity (BMI 30-39.9) 11/01/2013   Routine general medical examination at a health care facility 11/01/2010   MENOPAUSAL SYNDROME 07/16/2010   Chronic seasonal allergic rhinitis due to pollen 01/14/2008   Premenstrual tension syndrome 01/13/2008    PCP: Vincente Shivers, NP  REFERRING PROVIDER: Vincente Shivers, NP  REFERRING DIAG:  [phone] (ICD-10-CM) - Acute pain of right knee      RATIONALE FOR EVALUATION AND TREATMENT: Rehabilitation  THERAPY DIAG: Chronic pain of right knee  Stiffness of right knee, not elsewhere classified  Muscle weakness (generalized)  Acute pain of right knee  ONSET DATE: October 10th 2025  FOLLOW-UP APPT SCHEDULED WITH REFERRING PROVIDER: No    SUBJECTIVE:  SUBJECTIVE STATEMENT:    Patient reports to OPPT with a chief concern of L knee pain.   PERTINENT HISTORY:   Patient reports a fall on Oct 10th; she reports that she tripped on the last step (going up), her shoes caught the last step and she fell and sprained her ankle. Patient reports that she has used tylenol and voltaren with minimal relief. She reports that's a reclined position relieves the pain however as soon as she transitions from reclined to seated the pain returns. Patient uses a recumbent elliptical in order to transition from the recliner. Patient reports that the pain prevents her from  prolonged standing, sitting and she has to navigate stairs one step at a time.   Denies b/b, Saddle parasthesia, chills/fever, night sweats, nausea, vomiting,   Imaging:   EXAM: 4 VIEW(S) XRAY OF THE RIGHT KNEE 03/05/2024 09:58:31 AM   COMPARISON: None available.   CLINICAL HISTORY: acute right knee pain x 4 weeks after fall.   FINDINGS:   BONES AND JOINTS: No acute fracture. No focal osseous lesion. No joint dislocation. There is a small joint effusion. There is lateral patellofemoral compartment joint space narrowing compatible with moderate degenerative change. There are also mild degenerative changes at the lateral compartment of the knee with joint space narrowing and osteophyte formation.   SOFT TISSUES: The soft tissues are unremarkable.   IMPRESSION: 1. Small joint effusion. 2. No acute findings. 3. Moderate degenerative change of the knee.   Electronically signed by: Greig Pique MD 03/09/2024 11:31 PM EST RP Workstation: [id number]  PAIN:   Pain Intensity: Present: 0/10, Best: /10, Worst: 8/10 Pain location: R knee  Pain quality: intermittent and sharp  Radiating pain: Yes  Swelling: Yes  Numbness/Tingling: No Focal weakness or buckling: Yes History of prior back, hip, or knee injury, pain, surgery, or therapy: No  PRECAUTIONS: Fall  WEIGHT BEARING RESTRICTIONS: No  FALLS: Has patient fallen in last 6 months? Yes. Number of falls 1  Living Environment Lives with: lives with their spouse Lives in: House/apartment Stairs: Internal 15 Step; Rail Support: Both   Prior level of function: Independent  Occupational demands: Retired   Hobbies: Taking Care of her dog  PATIENT GOALS: Patient would like to be able to walk without wobbling and get my knee stronger.    OBJECTIVE:   Patient Surveys  LEFS  Extreme difficulty/unable (0), Quite a bit of difficulty (1), Moderate difficulty (2), Little difficulty (3), No difficulty (4) Survey date:   03/15/2024  Any of your usual work, housework or school activities 3  2. Usual hobbies, recreational or sporting activities 2  3. Getting into/out of the bath 0  4. Walking between rooms 4  5. Putting on socks/shoes 3  6. Squatting  1  7. Lifting an object, like a bag of groceries from the floor 3  8. Performing light activities around your home 3  9. Performing heavy activities around your home 1  10. Getting into/out of a car 2  11. Walking 2 blocks 1  12. Walking 1 mile 0  13. Going up/down 10 stairs (1 flight) 3  14. Standing for 1 hour 2  15.  sitting for 1 hour 2  16. Running on even ground 0  17. Running on uneven ground 0  18. Making sharp turns while running fast 0  19. Hopping  0  20. Rolling over in bed 2  Score total:  28/80     Cognition WNL  Gross Musculoskeletal Assessment Tremor: None Bulk: Normal Tone: Normal  GAIT: Distance walked: 10 m Assistive device utilized: None Level of assistance: Complete Independence Comments: Decreased weight bearing on R, reciprocal gait pattern   Posture:  AROM AROM (Normal range in degrees) AROM  Hip Right Left  Flexion (125)    Extension (15)    Abduction (40)    Adduction     Internal Rotation (45)    External Rotation (45)        Knee    Flexion (135) 100 130  Extension (0) 0 0      Ankle    Dorsiflexion (20)    Plantarflexion (50)    Inversion (35)    Eversion (15    (* = pain; Blank rows = not tested)  LE MMT: MMT (out of 5) Right Left  Hip flexion 4- 4-  Hip extension    Hip abduction    Hip adduction    Hip internal rotation    Hip external rotation    Knee flexion 4- 4  Knee extension 4- 4  Ankle dorsiflexion 4+ 4+  Ankle plantarflexion 5 5  Ankle inversion    Ankle eversion    (* = pain; Blank rows = not tested)  Sensation Grossly intact to light touch bilateral LEs as determined by testing dermatomes L2-S2. Proprioception, and hot/cold testing deferred on this  date.  Reflexes R/L Knee Jerk (L3/4): 2+/2+  Ankle Jerk (S1/2): 2+/2+   Muscle Length Hamstrings: R: Positive for tightness    Palpation Location LEFT  RIGHT           Quadriceps  1  Medial Hamstrings  0  Lateral Hamstrings  0  Lateral Hamstring tendon  0  Medial Hamstring tendon  0  Quadriceps tendon  0  Patella    Patellar Tendon  1  Tibial Tuberosity    Medial joint line  0  Lateral joint line  0  MCL  0  LCL  0  Adductor Tubercle    Pes Anserine tendon    Infrapatellar fat pad    Fibular head    Popliteal fossa    (Blank rows = not tested) Graded on 0-4 scale (0 = no pain, 1 = pain, 2 = pain with wincing/grimacing/flinching, 3 = pain with withdrawal, 4 = unwilling to allow palpation), (Blank rows = not tested)  Passive Accessory Motion Deferred  VASCULAR Deferred    SPECIAL TESTS  Ligamentous Stability  ACL: Lachman's: R: Negative  Anterior Drawer: R: Negative   MCL: Valgus Stress (30 degrees flexion): R: Negative  LCL: Varus Stress (30 degrees flexion): R: Negative   Functional Tests:  : .75 m/s 5TSTS: 14.07s  TODAY'S TREATMENT: DATE: 03/22/24  Reviewed HEP with return demonstration:  Access Code: [facility] URL: [email or web address] Date: 03/15/2024 Prepared by: Lonni Pall  Exercises - Seated Piriformis Stretch  - 2 x daily - 7 x weekly - 3 sets - 15- 30s hold - Seated Piriformis Stretch  - 2 x daily - 7 x weekly - 2-3 sets - 30s hold - Seated Hamstring Stretch  - 2 x daily - 7 x weekly - 3 sets - 15-30s hold - Mini Squat with Counter Support  - 2 x daily - 3-4 x weekly - 2 sets - 10-12 reps  PATIENT EDUCATION:  Education details: HEP, POC, Exercise Technique, Person educated: Patient Education method: Explanation, Demonstration, and Handouts Education comprehension: verbalized understanding and returned demonstration   HOME EXERCISE PROGRAM:  Access Code:  [facility] URL:  [email or web address] Date: 03/15/2024 Prepared by: Lonni Pall  Exercises - Seated Piriformis Stretch  - 2 x daily - 7 x weekly - 3 sets - 15- 30s hold - Seated Piriformis Stretch  - 2 x daily - 7 x weekly - 2-3 sets - 30s hold - Seated Hamstring Stretch  - 2 x daily - 7 x weekly - 3 sets - 15-30s hold - Mini Squat with Counter Support  - 2 x daily - 3-4 x weekly - 2 sets - 10-12 reps  ASSESSMENT:  CLINICAL IMPRESSION: Patient is a 68 y.o. female who was seen today for physical therapy evaluation and treatment for R Knee pain following a fall last month. Patient presents with decreased LE strength, decreased ROM and pain in the R knee. Gait affected by knee pain; she presents with decreased weight bearing in R stance. Special tests negative for ligamentous structures. Palpation significant for pain in quadriceps tendon and patellar tendon; concordant pain with knee flexion. R knee flexion significantly less than L knee secondary to pain. Functional tests presenting with decreased gait speed and decreased LE strength (see above at and 5TSTS). Based on today's performance patient will benefit from skilled PT in order to address current deficits and improve Quality of life.   OBJECTIVE IMPAIRMENTS: Abnormal gait, cardiopulmonary status limiting activity, decreased endurance, difficulty walking, decreased ROM, decreased strength, and pain.   ACTIVITY LIMITATIONS: sitting, standing, squatting, stairs, and transfers  PARTICIPATION LIMITATIONS: community activity and yard work  PERSONAL FACTORS: Age, Past/current experiences, and Time since onset of injury/illness/exacerbation are also affecting patient's functional outcome.   REHAB POTENTIAL: Good  CLINICAL DECISION MAKING: Evolving/moderate complexity  EVALUATION COMPLEXITY: Moderate   GOALS: Goals reviewed with patient? Yes  SHORT TERM GOALS: Target date: 04/19/2024  Pt will be independent with HEP to improve  strength and decrease knee pain to improve pain-free function at home and work. Baseline: 03/15/2024: Initial HEP provided Goal status: INITIAL   LONG TERM GOALS: Target date: 05/17/2024  Pt will decrease 5TSTS by at least 3 seconds in order to demonstrate clinically significant improvement in LE strength  Baseline: 03/15/2024: 14.07s Goal status: INITIAL  2.  Pt will decrease worst knee pain by at least 3 points on the NPRS in order to demonstrate clinically significant reduction in knee pain. Baseline: 03/15/2024: 8/10 NPS Goal status: INITIAL  3.  Pt will increase LEFS score by at least 9 points in order demonstrate clinically significant reduction in knee pain/disability.       Baseline: 03/15/2024: 28/80 (80/80 = max disability)  Goal status: INITIAL  4.  Pt will increase strength of Knee flexion/ext to a 4+ grade bilaterally in order to demonstrate improvement in strength and function  Baseline: 03/15/2024:   R L  Knee flexion 4- 4  Knee extension 4- 4   Goal status: INITIAL   PLAN: PT FREQUENCY: 1-2x/week  PT DURATION: 8 weeks  PLANNED INTERVENTIONS: Therapeutic exercises, Therapeutic activity, Neuromuscular re-education, Balance training, Gait training, Patient/Family education, Self Care, Joint mobilization, Joint manipulation, Vestibular training, Canalith repositioning, Orthotic/Fit training, DME instructions, Dry Needling, Electrical stimulation, Spinal manipulation, Spinal mobilization, Cryotherapy, Moist heat, Taping, Traction, Ultrasound, Ionotophoresis 4mg /ml Dexamethasone, Manual therapy, and Re-evaluation.  PLAN FOR NEXT SESSION: Review HEP, Progress Knee Strengthening, Hip Strengthening, Knee Flexion stretches, hamstring stretches    Lonni Pall PT, DPT Physical Therapist- [place]  03/22/2024, 8:16 AM

## 2024-03-23 ENCOUNTER — Encounter: Admitting: General Practice

## 2024-03-23 ENCOUNTER — Ambulatory Visit

## 2024-03-23 VITALS — Ht 62.5 in | Wt 214.0 lb

## 2024-03-23 DIAGNOSIS — Z0000 Encounter for general adult medical examination without abnormal findings: Secondary | ICD-10-CM | POA: Diagnosis not present

## 2024-03-23 NOTE — Patient Instructions (Addendum)
 Mercedes White,  Thank you for taking the time for your Medicare Wellness Visit. I appreciate your continued commitment to your health goals. Please review the care plan we discussed, and feel free to reach out if I can assist you further.  Please note that Annual Wellness Visits do not include a physical exam. Some assessments may be limited, especially if the visit was conducted virtually. If needed, we may recommend an in-person follow-up with your provider.  Ongoing Care Seeing your primary care provider every 3 to 6 months helps us  monitor your health and provide consistent, personalized care.   Referrals If a referral was made during today's visit and you haven't received any updates within two weeks, please contact the referred provider directly to check on the status.  Recommended Screenings:  Health Maintenance  Topic Date Due   DEXA scan (bone density measurement)  Never done   COVID-19 Vaccine (5 - 2025-26 season) 03/20/2026*   Breast Cancer Screening  12/16/2024   Medicare Annual Wellness Visit  03/23/2025   Colon Cancer Screening  02/08/2026   DTaP/Tdap/Td vaccine (4 - Td or Tdap) 10/28/2033   Pneumococcal Vaccine for age over 32  Completed   Flu Shot  Completed   Hepatitis C Screening  Completed   Zoster (Shingles) Vaccine  Completed   Meningitis B Vaccine  Aged Out  *Topic was postponed. The date shown is not the original due date.       03/23/2024   10:59 AM  Advanced Directives  Does Patient Have a Medical Advance Directive? Yes  Type of Estate Agent of [place];Living will  Does patient want to make changes to medical advance directive? Yes (Inpatient - patient requests chaplain consult to change a medical advance directive)  Copy of Healthcare Power of Attorney in Chart? No - copy requested    Vision: Annual vision screenings are recommended for early detection of glaucoma, cataracts, and diabetic retinopathy. These exams can also reveal  signs of chronic conditions such as diabetes and high blood pressure.  Dental: Annual dental screenings help detect early signs of oral cancer, gum disease, and other conditions linked to overall health, including heart disease and diabetes.

## 2024-03-23 NOTE — Progress Notes (Signed)
 Chief Complaint  Patient presents with   Medicare Wellness     Subjective:   Mercedes White is a 68 y.o. female who presents for a Medicare Annual Wellness Visit.  I connected with  Mercedes White on 03/23/24 by a audio enabled telemedicine application and verified that I am speaking with the correct person using two identifiers.  Patient Location: Home  Provider Location: Office/Clinic  Persons Participating in Visit: Patient.  I discussed the limitations of evaluation and management by telemedicine. The patient expressed understanding and agreed to proceed.  Vital Signs: Because this visit was a virtual/telehealth visit, some criteria may be missing or patient reported. Any vitals not documented were not able to be obtained and vitals that have been documented are patient reported.   Allergies (verified) Cephalexin   History: Past Medical History:  Diagnosis Date   Allergy    Arthritis    hands    Diverticulosis    Hx of adenomatous polyp of colon 02/17/2019   Migraines    with menses   Past Surgical History:  Procedure Laterality Date   CESAREAN SECTION     x1   COLONOSCOPY  2010   WISDOM TOOTH EXTRACTION     Family History  Problem Relation Age of Onset   Arthritis Mother    Cancer Mother 30       Breast cancer   Breast cancer Mother 83   Diabetes Neg Hx    Hypertension Neg Hx    Colon cancer Neg Hx    Colon polyps Neg Hx    Esophageal cancer Neg Hx    Rectal cancer Neg Hx    Stomach cancer Neg Hx    Social History   Occupational History   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[employer]    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  Tobacco Use   Smoking status: Never    Passive exposure: Past   Smokeless tobacco: Never  Vaping Use   Vaping status: Never Used  Substance and Sexual Activity   Alcohol use: Yes    Alcohol/week: 3.0 standard drinks of alcohol    Types: 1 Glasses of wine, 1 Cans of beer, 1 Shots of liquor per week    Comment:  occasionally    Drug use: No   Sexual activity: Yes   Tobacco Counseling Counseling given: Not Answered  SDOH Screenings   Food Insecurity: No Food Insecurity (03/23/2024)  Housing: Unknown (03/23/2024)  Transportation Needs: No Transportation Needs (03/23/2024)  Utilities: Not At Risk (03/23/2024)  Alcohol Screen: Low Risk  (03/04/2024)  Depression (PHQ2-9): Low Risk  (03/23/2024)  Recent Concern: Depression (PHQ2-9) - Medium Risk (03/05/2024)  Financial Resource Strain: Low Risk  (03/04/2024)  Physical Activity: Insufficiently Active (03/23/2024)  Social Connections: Moderately Isolated (03/23/2024)  Stress: No Stress Concern Present (03/23/2024)  Tobacco Use: Low Risk  (03/23/2024)  Health Literacy: Adequate Health Literacy (03/23/2024)   See flowsheets for full screening details  Depression Screen PHQ 2 & 9 Depression Scale- Over the past 2 weeks, how often have you been bothered by any of the following problems? Little interest or pleasure in doing things: 0 Feeling down, depressed, or hopeless (PHQ Adolescent also includes...irritable): 0 PHQ-2 Total Score: 0 Trouble falling or staying asleep, or sleeping too much: 0 Feeling tired or having little energy: 0 Poor appetite or overeating (PHQ Adolescent also includes...weight loss): 0 Feeling bad about yourself - or that you are a failure or have let yourself or your family down:  0 Trouble concentrating on things, such as reading the newspaper or watching television [facility] Adolescent also includes...like school work): 0 Moving or speaking so slowly that other people could have noticed. Or the opposite - being so fidgety or restless that you have been moving around a lot more than usual: 0 Thoughts that you would be better off dead, or of hurting yourself in some way: 0 PHQ-9 Total Score: 0 If you checked off any problems, how difficult have these problems made it for you to do your work, take care of things at home, or get  along with other people?: Not difficult at all  Depression Treatment Depression Interventions/Treatment : EYV7-0 Score <4 Follow-up Not Indicated     Goals Addressed               This Visit's Progress     Patient Stated (pt-stated)        Patient stated she plans to continue exercising and watching her diet       Visit info / Clinical Intake: Medicare Wellness Visit Type:: Initial Annual Wellness Visit Persons participating in visit:: patient Medicare Wellness Visit Mode:: Telephone If telephone:: video declined Because this visit was a virtual/telehealth visit:: vitals recorded from last visit If Telephone or Video please confirm:: I connected with the patient using audio enabled telemedicine application and verified that I am speaking with the correct person using two identifiers; I discussed the limitations of evaluation and management by telemedicine; The patient expressed understanding and agreed to proceed Patient Location:: Home Provider Location:: Office Information given by:: patient Interpreter Needed?: No Pre-visit prep was completed: yes AWV questionnaire completed by patient prior to visit?: no Living arrangements:: lives with spouse/significant other Patient's Overall Health Status Rating: good Typical amount of pain: none Does pain affect daily life?: no Are you currently prescribed opioids?: no  Dietary Habits and Nutritional Risks How many meals a day?: 2 (2-3 days) Eats fruit and vegetables daily?: yes Most meals are obtained by: preparing own meals; eating out In the last 2 weeks, have you had any of the following?: none Diabetic:: no  Functional Status Activities of Daily Living (to include ambulation/medication): Independent Ambulation: Independent with device- listed below Home Assistive Devices/Equipment: Eyeglasses Medication Administration: Independent Home Management: Independent Manage your own finances?: yes Primary transportation is:  driving Concerns about vision?: no *vision screening is required for WTM* Concerns about hearing?: no  Fall Screening Falls in the past year?: 1 Number of falls in past year: 0 (1) Was there an injury with Fall?: 0 Fall Risk Category Calculator: 1 Patient Fall Risk Level: Low Fall Risk  Fall Risk Patient at Risk for Falls Due to: No Fall Risks Fall risk Follow up: Falls evaluation completed; Falls prevention discussed  Home and Transportation Safety: All rugs have non-skid backing?: yes All stairs or steps have railings?: yes Grab bars in the bathtub or shower?: yes Have non-skid surface in bathtub or shower?: yes Good home lighting?: yes Regular seat belt use?: yes Hospital stays in the last year:: no  Cognitive Assessment Difficulty concentrating, remembering, or making decisions? : no Will 6CIT or Mini Cog be Completed: yes What year is it?: 0 points What month is it?: 0 points Give patient an address phrase to remember (5 components): [address] [place], [place] About what time is it?: 0 points Count backwards from 20 to 1: 0 points Say the months of the year in reverse: 0 points Repeat the address phrase from earlier: 2 points (Drive)  6 CIT Score: 2 points  Advance Directives (For Healthcare) Does Patient Have a Medical Advance Directive?: Yes Does patient want to make changes to medical advance directive?: Yes (Inpatient - patient requests chaplain consult to change a medical advance directive) Type of Advance Directive: Healthcare Power of [place]; Living will Copy of Healthcare Power of Attorney in Chart?: No - copy requested Copy of Living Will in Chart?: No - copy requested  Reviewed/Updated  Reviewed/Updated: Reviewed All (Medical, Surgical, Family, Medications, Allergies, Care Teams, Patient Goals)        Objective:    Today's Vitals   03/23/24 1058  Weight: 214 lb (97.1 kg)  Height: 5' 2.5 (1.588 m)   Body mass index is 38.52 kg/m.  Current  Medications (verified) Outpatient Encounter Medications as of 03/23/2024  Medication Sig   cetirizine (ZYRTEC) 10 MG tablet Take 20 mg by mouth daily as needed.   MAGNESIUM PO Take by mouth.   Multiple Vitamins-Minerals (ONE-A-DAY PROACTIVE 65+ PO) Take 2 tablets by mouth daily at 8 pm.   VITAMIN D, CHOLECALCIFEROL, PO Take by mouth daily.   VITAMIN E PO Take by mouth.   No facility-administered encounter medications on file as of 03/23/2024.   Hearing/Vision screen Hearing Screening - Comments:: Denies hearing difficulties   Vision Screening - Comments:: Wears rx glasses - up to date with routine eye exams with [facility]  Immunizations and Health Maintenance Health Maintenance  Topic Date Due   DEXA SCAN  Never done   COVID-19 Vaccine (5 - 2025-26 season) 03/20/2026 (Originally 01/05/2024)   Mammogram  12/16/2024   Medicare Annual Wellness (AWV)  03/23/2025   Colonoscopy  02/08/2026   DTaP/Tdap/Td (4 - Td or Tdap) 10/28/2033   Pneumococcal Vaccine: 50+ Years  Completed   Influenza Vaccine  Completed   Hepatitis C Screening  Completed   Zoster Vaccines- Shingrix   Completed   Meningococcal B Vaccine  Aged Out        Assessment/Plan:  This is a routine wellness examination for Mercedes White.  Patient Care Team: Vincente Shivers, NP as PCP - General (General Practice)  I have personally reviewed and noted the following in the patient's chart:   Medical and social history Use of alcohol, tobacco or illicit drugs  Current medications and supplements including opioid prescriptions. Functional ability and status Nutritional status Physical activity Advanced directives List of other physicians Hospitalizations, surgeries, and ER visits in previous 12 months Vitals Screenings to include cognitive, depression, and falls Referrals and appointments  No orders of the defined types were placed in this encounter.  In addition, I have reviewed and discussed with patient certain  preventive protocols, quality metrics, and best practice recommendations. A written personalized care plan for preventive services as well as general preventive health recommendations were provided to patient.   Mercedes White, CMA   03/23/2024   Return in 1 year (on 03/23/2025).  After Visit Summary: (MyChart) Due to this being a telephonic visit, the after visit summary with patients personalized plan was offered to patient via MyChart   Nurse Notes: MMG and DEXA Scan are scheduled for 04/2024. Scheduled 2026 AWV/CPE appts

## 2024-03-24 ENCOUNTER — Ambulatory Visit

## 2024-03-24 DIAGNOSIS — G8929 Other chronic pain: Secondary | ICD-10-CM

## 2024-03-24 DIAGNOSIS — M6281 Muscle weakness (generalized): Secondary | ICD-10-CM

## 2024-03-24 DIAGNOSIS — M25561 Pain in right knee: Secondary | ICD-10-CM | POA: Diagnosis not present

## 2024-03-24 DIAGNOSIS — M25661 Stiffness of right knee, not elsewhere classified: Secondary | ICD-10-CM

## 2024-03-24 NOTE — Therapy (Signed)
 OUTPATIENT PHYSICAL THERAPY KNEE TREATMENT  Patient Name: Mercedes White MRN: [id number] DOB:October 11, 1955, 68 y.o., female Today's Date: 03/24/2024  END OF SESSION:  PT End of Session - 03/24/24 0815     Visit Number 4    Number of Visits 17    Date for Recertification  05/10/24    PT Start Time 0815    PT Stop Time 0855    PT Time Calculation (min) 40 min    Activity Tolerance Patient tolerated treatment well    Behavior During Therapy [facility] for tasks assessed/performed          Past Medical History:  Diagnosis Date   Allergy    Arthritis    hands    Diverticulosis    Hx of adenomatous polyp of colon 02/17/2019   Migraines    with menses   Past Surgical History:  Procedure Laterality Date   CESAREAN SECTION     x1   COLONOSCOPY  2010   WISDOM TOOTH EXTRACTION     Patient Active Problem List   Diagnosis Date Noted   Establishing care with new doctor, encounter for 03/05/2024   Fall 03/05/2024   Acute pain of right knee 03/05/2024   Hyperlipidemia 03/05/2024   Finger pain, left 11/19/2022   Hx of adenomatous polyp of colon 02/17/2019   Prediabetes 12/02/2017   Osteoarthrosis, generalized, involving multiple sites 11/14/2015   Obesity (BMI 30-39.9) 11/01/2013   Routine general medical examination at a health care facility 11/01/2010   MENOPAUSAL SYNDROME 07/16/2010   Chronic seasonal allergic rhinitis due to pollen 01/14/2008   Premenstrual tension syndrome 01/13/2008    PCP: Vincente Shivers, NP  REFERRING PROVIDER: Vincente Shivers, NP  REFERRING DIAG:  [phone] (ICD-10-CM) - Acute pain of right knee      RATIONALE FOR EVALUATION AND TREATMENT: Rehabilitation  THERAPY DIAG: Chronic pain of right knee  Stiffness of right knee, not elsewhere classified  Muscle weakness (generalized)  Acute pain of right knee  ONSET DATE: October 10th 2025  FOLLOW-UP APPT SCHEDULED WITH REFERRING PROVIDER: No    SUBJECTIVE:                                                                                                                                                                                          SUBJECTIVE STATEMENT:    Patient reports to OPPT with a chief concern of L knee pain.   PERTINENT HISTORY:   Patient reports a fall on Oct 10th; she reports that she tripped on the last step (going up), her shoes caught the last step and she fell and sprained her ankle. Patient reports that she  has used tylenol and voltaren with minimal relief. She reports that's a reclined position relieves the pain however as soon as she transitions from reclined to seated the pain returns. Patient uses a recumbent elliptical in order to transition from the recliner. Patient reports that the pain prevents her from prolonged standing, sitting and she has to navigate stairs one step at a time.   Denies b/b, Saddle parasthesia, chills/fever, night sweats, nausea, vomiting,   Imaging:   EXAM: 4 VIEW(S) XRAY OF THE RIGHT KNEE 03/05/2024 09:58:31 AM   COMPARISON: None available.   CLINICAL HISTORY: acute right knee pain x 4 weeks after fall.   FINDINGS:   BONES AND JOINTS: No acute fracture. No focal osseous lesion. No joint dislocation. There is a small joint effusion. There is lateral patellofemoral compartment joint space narrowing compatible with moderate degenerative change. There are also mild degenerative changes at the lateral compartment of the knee with joint space narrowing and osteophyte formation.   SOFT TISSUES: The soft tissues are unremarkable.   IMPRESSION: 1. Small joint effusion. 2. No acute findings. 3. Moderate degenerative change of the knee.   Electronically signed by: Greig Pique MD 03/09/2024 11:31 PM EST RP Workstation: [id number]  PAIN:   Pain Intensity: Present: 0/10, Best: /10, Worst: 8/10 Pain location: R knee  Pain quality: intermittent and sharp  Radiating pain: Yes  Swelling: Yes  Numbness/Tingling: No Focal  weakness or buckling: Yes History of prior back, hip, or knee injury, pain, surgery, or therapy: No  PRECAUTIONS: Fall  WEIGHT BEARING RESTRICTIONS: No  FALLS: Has patient fallen in last 6 months? Yes. Number of falls 1  Living Environment Lives with: lives with their spouse Lives in: House/apartment Stairs: Internal 15 Step; Rail Support: Both   Prior level of function: Independent  Occupational demands: Retired   Hobbies: Taking Care of her dog  PATIENT GOALS: Patient would like to be able to walk without wobbling and get my knee stronger.    OBJECTIVE:   Patient Surveys  LEFS  Extreme difficulty/unable (0), Quite a bit of difficulty (1), Moderate difficulty (2), Little difficulty (3), No difficulty (4) Survey date:  03/15/2024  Any of your usual work, housework or school activities 3  2. Usual hobbies, recreational or sporting activities 2  3. Getting into/out of the bath 0  4. Walking between rooms 4  5. Putting on socks/shoes 3  6. Squatting  1  7. Lifting an object, like a bag of groceries from the floor 3  8. Performing light activities around your home 3  9. Performing heavy activities around your home 1  10. Getting into/out of a car 2  11. Walking 2 blocks 1  12. Walking 1 mile 0  13. Going up/down 10 stairs (1 flight) 3  14. Standing for 1 hour 2  15.  sitting for 1 hour 2  16. Running on even ground 0  17. Running on uneven ground 0  18. Making sharp turns while running fast 0  19. Hopping  0  20. Rolling over in bed 2  Score total:  28/80     Cognition WNL     Gross Musculoskeletal Assessment Tremor: None Bulk: Normal Tone: Normal  GAIT: Distance walked: 10 m Assistive device utilized: None Level of assistance: Complete Independence Comments: Decreased weight bearing on R, reciprocal gait pattern   Posture:  AROM AROM (Normal range in degrees) AROM  Hip Right Left  Flexion (125)    Extension (15)  Abduction (40)    Adduction      Internal Rotation (45)    External Rotation (45)        Knee    Flexion (135) 100 130  Extension (0) 0 0      Ankle    Dorsiflexion (20)    Plantarflexion (50)    Inversion (35)    Eversion (15    (* = pain; Blank rows = not tested)  LE MMT: MMT (out of 5) Right Left  Hip flexion 4- 4-  Hip extension    Hip abduction    Hip adduction    Hip internal rotation    Hip external rotation    Knee flexion 4- 4  Knee extension 4- 4  Ankle dorsiflexion 4+ 4+  Ankle plantarflexion 5 5  Ankle inversion    Ankle eversion    (* = pain; Blank rows = not tested)  Sensation Grossly intact to light touch bilateral LEs as determined by testing dermatomes L2-S2. Proprioception, and hot/cold testing deferred on this date.  Reflexes R/L Knee Jerk (L3/4): 2+/2+  Ankle Jerk (S1/2): 2+/2+   Muscle Length Hamstrings: R: Positive for tightness    Palpation Location LEFT  RIGHT           Quadriceps  1  Medial Hamstrings  0  Lateral Hamstrings  0  Lateral Hamstring tendon  0  Medial Hamstring tendon  0  Quadriceps tendon  0  Patella    Patellar Tendon  1  Tibial Tuberosity    Medial joint line  0  Lateral joint line  0  MCL  0  LCL  0  Adductor Tubercle    Pes Anserine tendon    Infrapatellar fat pad    Fibular head    Popliteal fossa    (Blank rows = not tested) Graded on 0-4 scale (0 = no pain, 1 = pain, 2 = pain with wincing/grimacing/flinching, 3 = pain with withdrawal, 4 = unwilling to allow palpation), (Blank rows = not tested)  Passive Accessory Motion Deferred  VASCULAR Deferred    SPECIAL TESTS  Ligamentous Stability  ACL: Lachman's: R: Negative  Anterior Drawer: R: Negative   MCL: Valgus Stress (30 degrees flexion): R: Negative  LCL: Varus Stress (30 degrees flexion): R: Negative   Functional Tests:  : .75 m/s 5TSTS: 14.07s  TODAY'S TREATMENT: DATE: 03/24/24  Subjective: Patient reports pain in her moderate pain in her knee  following PT session. Pain rated around 7/10 last night. Currently 6/10 prior to start of session.   Therapeutic Exercises:  Standing Heel Raise  2 x 20   Standing Toe Raises - Wall Supported   3 x 10  Supine Knee Flexion Stretch - Strap   30s/bout x 3 in order to improve ROM and tissue extensibility  Therapeutic Activity:  Matrix Exercise Bike Level 4-2 x 6 min (Seat 11) for LE strength and endurance for walking capacity; PT manually adjusted resistance throughout per patient tolerance  Resisted Sit to Stand - From Adjustable Bench   3 x 10 - 3 Kg MB  Lateral Stepping against resistance  4 x 24' - Red TB around ankles   Standing 3 Way Hip R/L: 2 x 8 ea dir - Red TB around ankles   TRX Squat  3 x 10    PATIENT EDUCATION:  Education details: Exercise Technique Person educated: Patient Education method: Explanation, Demonstration, and Handouts Education comprehension: verbalized understanding and returned demonstration   HOME EXERCISE PROGRAM:  Access Code: [email or web address] URL: [email or web address] Date: 03/17/2024 Prepared by: Lonni Pall  Exercises - Recumbent Bike  - 1 x daily - 3 x weekly - 1 sets - 10 min hold - Seated Piriformis Stretch  - 2 x daily - 7 x weekly - 3 sets - 15- 30s hold - Seated Hamstring Stretch  - 2 x daily - 7 x weekly - 3 sets - 15-30s hold - Seated Knee Flexion Stretch  - 1 x daily - 7 x weekly - 3 sets - 30s hold - Mini Squat with Counter Support  - 2 x daily - 3-4 x weekly - 2 sets - 10-12 reps - Supine Heel Slide with Strap  - 2 x daily - 7 x weekly - 3 sets - 30s hold - Seated Long Arc Quad  - 1 x daily - 3-4 x weekly - 2-3 sets - 10-15 reps  Access Code: [email or web address] URL: [email or web address] Date: 03/15/2024 Prepared by: Lonni Pall  Exercises - Seated Piriformis Stretch  - 2 x daily - 7 x weekly - 3 sets - 15- 30s hold - Seated Piriformis Stretch  - 2 x daily - 7 x weekly - 2-3 sets - 30s hold - Seated  Hamstring Stretch  - 2 x daily - 7 x weekly - 3 sets - 15-30s hold - Mini Squat with Counter Support  - 2 x daily - 3-4 x weekly - 2 sets - 10-12 reps  ASSESSMENT:  CLINICAL IMPRESSION: Continued PT POC in management of R knee pain. Patient tolerated all progressions to PT interventions without additional pain in the R knee. Patient tolerated increased SLS stance on the R knee during ankle 3 way.  Good carryover with knee flexion stretch with ankle strap in supine position. She also demonstrated good squat technique with minimal use of UE.  Patient's R knee flexion still significantly less than L knee secondary to pain. PT plans to continue knee/hip strengthening in order to provide improved muscular support to R knee. Based on today's performance patient will benefit from skilled PT in order to address current deficits and improve Quality of life.   OBJECTIVE IMPAIRMENTS: Abnormal gait, cardiopulmonary status limiting activity, decreased endurance, difficulty walking, decreased ROM, decreased strength, and pain.   ACTIVITY LIMITATIONS: sitting, standing, squatting, stairs, and transfers  PARTICIPATION LIMITATIONS: community activity and yard work  PERSONAL FACTORS: Age, Past/current experiences, and Time since onset of injury/illness/exacerbation are also affecting patient's functional outcome.   REHAB POTENTIAL: Good  CLINICAL DECISION MAKING: Evolving/moderate complexity  EVALUATION COMPLEXITY: Moderate   GOALS: Goals reviewed with patient? Yes  SHORT TERM GOALS: Target date: 04/21/2024  Pt will be independent with HEP to improve strength and decrease knee pain to improve pain-free function at home and work. Baseline: 03/15/2024: Initial HEP provided Goal status: INITIAL   LONG TERM GOALS: Target date: 05/19/2024  Pt will decrease 5TSTS by at least 3 seconds in order to demonstrate clinically significant improvement in LE strength  Baseline: 03/15/2024: 14.07s Goal status:  INITIAL  2.  Pt will decrease worst knee pain by at least 3 points on the NPRS in order to demonstrate clinically significant reduction in knee pain. Baseline: 03/15/2024: 8/10 NPS Goal status: INITIAL  3.  Pt will increase LEFS score by at least 9 points in order demonstrate clinically significant reduction in knee pain/disability.       Baseline: 03/15/2024: 28/80 (80/80 = max disability)  Goal status: INITIAL  4.  Pt will increase strength of Knee flexion/ext to a  4+ grade bilaterally in order to demonstrate improvement in strength and function  Baseline: 03/15/2024:   R L  Knee flexion 4- 4  Knee extension 4- 4   Goal status: INITIAL   PLAN: PT FREQUENCY: 1-2x/week  PT DURATION: 8 weeks  PLANNED INTERVENTIONS: Therapeutic exercises, Therapeutic activity, Neuromuscular re-education, Balance training, Gait training, Patient/Family education, Self Care, Joint mobilization, Joint manipulation, Vestibular training, Canalith repositioning, Orthotic/Fit training, DME instructions, Dry Needling, Electrical stimulation, Spinal manipulation, Spinal mobilization, Cryotherapy, Moist heat, Taping, Traction, Ultrasound, Ionotophoresis 4mg /ml Dexamethasone, Manual therapy, and Re-evaluation.  PLAN FOR NEXT SESSION: Review HEP, Initiate Knee Strengthening, Hip Strengthening, Knee Flexion stretches, hamstring stretches    Lonni Pall PT, DPT Physical Therapist- [place]  03/24/2024, 10:29 AM  MRN: [id number] DOB:1955/11/06, 68 y.o., female Today's Date: 03/24/2024  END OF SESSION:  PT End of Session - 03/24/24 0815     Visit Number 4    Number of Visits 17    Date for Recertification  05/10/24    PT Start Time 0815    PT Stop Time 0855    PT Time Calculation (min) 40 min    Activity Tolerance Patient tolerated treatment well    Behavior During Therapy [facility] for tasks assessed/performed           Past Medical History:  Diagnosis Date   Allergy    Arthritis    hands     Diverticulosis    Hx of adenomatous polyp of colon 02/17/2019   Migraines    with menses   Past Surgical History:  Procedure Laterality Date   CESAREAN SECTION     x1   COLONOSCOPY  2010   WISDOM TOOTH EXTRACTION     Patient Active Problem List   Diagnosis Date Noted   Establishing care with new doctor, encounter for 03/05/2024   Fall 03/05/2024   Acute pain of right knee 03/05/2024   Hyperlipidemia 03/05/2024   Finger pain, left 11/19/2022   Hx of adenomatous polyp of colon 02/17/2019   Prediabetes 12/02/2017   Osteoarthrosis, generalized, involving multiple sites 11/14/2015   Obesity (BMI 30-39.9) 11/01/2013   Routine general medical examination at a health care facility 11/01/2010   MENOPAUSAL SYNDROME 07/16/2010   Chronic seasonal allergic rhinitis due to pollen 01/14/2008   Premenstrual tension syndrome 01/13/2008    PCP: Vincente Shivers, NP  REFERRING PROVIDER: Vincente Shivers, NP  REFERRING DIAG:  [phone] (ICD-10-CM) - Acute pain of right knee      RATIONALE FOR EVALUATION AND TREATMENT: Rehabilitation  THERAPY DIAG: Chronic pain of right knee  Stiffness of right knee, not elsewhere classified  Muscle weakness (generalized)  Acute pain of right knee  ONSET DATE: October 10th 2025  FOLLOW-UP APPT SCHEDULED WITH REFERRING PROVIDER: No    SUBJECTIVE:  SUBJECTIVE STATEMENT:    Patient reports to OPPT with a chief concern of L knee pain.   PERTINENT HISTORY:   Patient reports a fall on Oct 10th; she reports that she tripped on the last step (going up), her shoes caught the last step and she fell and sprained her ankle. Patient reports that she has used tylenol and voltaren with minimal relief. She reports that's a reclined position relieves the pain however as soon as she  transitions from reclined to seated the pain returns. Patient uses a recumbent elliptical in order to transition from the recliner. Patient reports that the pain prevents her from prolonged standing, sitting and she has to navigate stairs one step at a time.   Denies b/b, Saddle parasthesia, chills/fever, night sweats, nausea, vomiting,   Imaging:   EXAM: 4 VIEW(S) XRAY OF THE RIGHT KNEE 03/05/2024 09:58:31 AM   COMPARISON: None available.   CLINICAL HISTORY: acute right knee pain x 4 weeks after fall.   FINDINGS:   BONES AND JOINTS: No acute fracture. No focal osseous lesion. No joint dislocation. There is a small joint effusion. There is lateral patellofemoral compartment joint space narrowing compatible with moderate degenerative change. There are also mild degenerative changes at the lateral compartment of the knee with joint space narrowing and osteophyte formation.   SOFT TISSUES: The soft tissues are unremarkable.   IMPRESSION: 1. Small joint effusion. 2. No acute findings. 3. Moderate degenerative change of the knee.   Electronically signed by: Greig Pique MD 03/09/2024 11:31 PM EST RP Workstation: [id number]  PAIN:   Pain Intensity: Present: 0/10, Best: /10, Worst: 8/10 Pain location: R knee  Pain quality: intermittent and sharp  Radiating pain: Yes  Swelling: Yes  Numbness/Tingling: No Focal weakness or buckling: Yes History of prior back, hip, or knee injury, pain, surgery, or therapy: No  PRECAUTIONS: Fall  WEIGHT BEARING RESTRICTIONS: No  FALLS: Has patient fallen in last 6 months? Yes. Number of falls 1  Living Environment Lives with: lives with their spouse Lives in: House/apartment Stairs: Internal 15 Step; Rail Support: Both   Prior level of function: Independent  Occupational demands: Retired   Hobbies: Taking Care of her dog  PATIENT GOALS: Patient would like to be able to walk without wobbling and get my knee stronger.     OBJECTIVE:   Patient Surveys  LEFS  Extreme difficulty/unable (0), Quite a bit of difficulty (1), Moderate difficulty (2), Little difficulty (3), No difficulty (4) Survey date:  03/15/2024  Any of your usual work, housework or school activities 3  2. Usual hobbies, recreational or sporting activities 2  3. Getting into/out of the bath 0  4. Walking between rooms 4  5. Putting on socks/shoes 3  6. Squatting  1  7. Lifting an object, like a bag of groceries from the floor 3  8. Performing light activities around your home 3  9. Performing heavy activities around your home 1  10. Getting into/out of a car 2  11. Walking 2 blocks 1  12. Walking 1 mile 0  13. Going up/down 10 stairs (1 flight) 3  14. Standing for 1 hour 2  15.  sitting for 1 hour 2  16. Running on even ground 0  17. Running on uneven ground 0  18. Making sharp turns while running fast 0  19. Hopping  0  20. Rolling over in bed 2  Score total:  28/80     Cognition WNL  Gross Musculoskeletal Assessment Tremor: None Bulk: Normal Tone: Normal  GAIT: Distance walked: 10 m Assistive device utilized: None Level of assistance: Complete Independence Comments: Decreased weight bearing on R, reciprocal gait pattern   Posture:  AROM AROM (Normal range in degrees) AROM  Hip Right Left  Flexion (125)    Extension (15)    Abduction (40)    Adduction     Internal Rotation (45)    External Rotation (45)        Knee    Flexion (135) 100 130  Extension (0) 0 0      Ankle    Dorsiflexion (20)    Plantarflexion (50)    Inversion (35)    Eversion (15    (* = pain; Blank rows = not tested)  LE MMT: MMT (out of 5) Right Left  Hip flexion 4- 4-  Hip extension    Hip abduction    Hip adduction    Hip internal rotation    Hip external rotation    Knee flexion 4- 4  Knee extension 4- 4  Ankle dorsiflexion 4+ 4+  Ankle plantarflexion 5 5  Ankle inversion    Ankle eversion    (* = pain; Blank  rows = not tested)  Sensation Grossly intact to light touch bilateral LEs as determined by testing dermatomes L2-S2. Proprioception, and hot/cold testing deferred on this date.  Reflexes R/L Knee Jerk (L3/4): 2+/2+  Ankle Jerk (S1/2): 2+/2+   Muscle Length Hamstrings: R: Positive for tightness    Palpation Location LEFT  RIGHT           Quadriceps  1  Medial Hamstrings  0  Lateral Hamstrings  0  Lateral Hamstring tendon  0  Medial Hamstring tendon  0  Quadriceps tendon  0  Patella    Patellar Tendon  1  Tibial Tuberosity    Medial joint line  0  Lateral joint line  0  MCL  0  LCL  0  Adductor Tubercle    Pes Anserine tendon    Infrapatellar fat pad    Fibular head    Popliteal fossa    (Blank rows = not tested) Graded on 0-4 scale (0 = no pain, 1 = pain, 2 = pain with wincing/grimacing/flinching, 3 = pain with withdrawal, 4 = unwilling to allow palpation), (Blank rows = not tested)  Passive Accessory Motion Deferred  VASCULAR Deferred    SPECIAL TESTS  Ligamentous Stability  ACL: Lachman's: R: Negative  Anterior Drawer: R: Negative   MCL: Valgus Stress (30 degrees flexion): R: Negative  LCL: Varus Stress (30 degrees flexion): R: Negative   Functional Tests:  : .75 m/s 5TSTS: 14.07s  TODAY'S TREATMENT: DATE: 03/24/2024   Subjective: Patient reports that she had moderate to severe R knee pain. Patient reported 7-8/10 NPS last night. Currently a 4/10 NPS in the R Knee. No further questions or concerns.    Therapeutic Exercises:   Standing Heel Raise   BLE: 2 x 20 - No UE   Standing Toe Raises - Wall Supported   BLE: 2 x 20   Knee Flexion Stretch - Supine  30s/bout x 3 in order to improve ROM and tissue extensibility   Therapeutic Activity:   Matrix Exercise Bike Level 4-2 x 5 min (Seat 11) for LE strength and endurance for walking capacity; PT manually adjusted resistance throughout per patient tolerance  TRX Squats   3 x 10     Resisted Sit to Stand -  From Adjustable bench             2 x 10 - 3 Kg Med Ball   Standing 3 Way Hip  R/L: 2 x 8 ea dir - Red TB around ankles   Lateral Walking Against Resistance  4 x 24' - Red TB around ankles   PATIENT EDUCATION:  Education details: Exercise Technique  Person educated: Patient Education method: Explanation, Demonstration, and Handouts Education comprehension: verbalized understanding and returned demonstration   HOME EXERCISE PROGRAM:  Access Code: [email or web address] URL: [email or web address] Date: 03/22/2024 Prepared by: Lonni Pall  Exercises - Recumbent Bike  - 1 x daily - 3 x weekly - 1 sets - 10 min hold - Seated Piriformis Stretch  - 2 x daily - 7 x weekly - 3 sets - 15- 30s hold - Seated Hamstring Stretch  - 2 x daily - 7 x weekly - 3 sets - 15-30s hold - Seated Knee Flexion Stretch  - 1 x daily - 7 x weekly - 3 sets - 30s hold - Mini Squat with Counter Support  - 2 x daily - 3-4 x weekly - 2 sets - 10-12 reps - Supine Heel Slide with Strap  - 2 x daily - 7 x weekly - 3 sets - 30s hold - Seated Long Arc Quad  - 1 x daily - 3-4 x weekly - 2-3 sets - 10-15 reps  Access Code: [email or web address] URL: [email or web address] Date: 03/15/2024 Prepared by: Lonni Pall  Exercises - Seated Piriformis Stretch  - 2 x daily - 7 x weekly - 3 sets - 15- 30s hold - Seated Piriformis Stretch  - 2 x daily - 7 x weekly - 2-3 sets - 30s hold - Seated Hamstring Stretch  - 2 x daily - 7 x weekly - 3 sets - 15-30s hold - Mini Squat with Counter Support  - 2 x daily - 3-4 x weekly - 2 sets - 10-12 reps  ASSESSMENT:  CLINICAL IMPRESSION: Patient reports to OPPT for continued management of R knee pain. She tolerated all progressions to PT interventions without report of additional pain in the R knee. Good form with squat form with minimal use of UE support. Good ability to maintain SLS during 3 way hip exercises. She still has intermittent pain in the  R knee preventing her from prolonged standing, walking and participating in community activities. Based on today's performance, pt will continue to benefit from skilled PT in order to facilitate return to PLOF and improve QoL.    OBJECTIVE IMPAIRMENTS: Abnormal gait, cardiopulmonary status limiting activity, decreased endurance, difficulty walking, decreased ROM, decreased strength, and pain.   ACTIVITY LIMITATIONS: sitting, standing, squatting, stairs, and transfers  PARTICIPATION LIMITATIONS: community activity and yard work  PERSONAL FACTORS: Age, Past/current experiences, and Time since onset of injury/illness/exacerbation are also affecting patient's functional outcome.   REHAB POTENTIAL: Good  CLINICAL DECISION MAKING: Evolving/moderate complexity  EVALUATION COMPLEXITY: Moderate   GOALS: Goals reviewed with patient? Yes  SHORT TERM GOALS: Target date: 04/21/2024  Pt will be independent with HEP to improve strength and decrease knee pain to improve pain-free function at home and work. Baseline: 03/15/2024: Initial HEP provided Goal status: INITIAL   LONG TERM GOALS: Target date: 05/19/2024  Pt will decrease 5TSTS by at least 3 seconds in order to demonstrate clinically significant improvement in LE strength  Baseline: 03/15/2024: 14.07s Goal status: INITIAL  2.  Pt will decrease worst knee pain by at least 3 points on the NPRS in  order to demonstrate clinically significant reduction in knee pain. Baseline: 03/15/2024: 8/10 NPS Goal status: INITIAL  3.  Pt will increase LEFS score by at least 9 points in order demonstrate clinically significant reduction in knee pain/disability.       Baseline: 03/15/2024: 28/80 (80/80 = max disability)  Goal status: INITIAL  4.  Pt will increase strength of Knee flexion/ext to a 4+ grade bilaterally in order to demonstrate improvement in strength and function  Baseline: 03/15/2024:   R L  Knee flexion 4- 4  Knee extension 4- 4    Goal status: INITIAL   PLAN: PT FREQUENCY: 1-2x/week  PT DURATION: 8 weeks  PLANNED INTERVENTIONS: Therapeutic exercises, Therapeutic activity, Neuromuscular re-education, Balance training, Gait training, Patient/Family education, Self Care, Joint mobilization, Joint manipulation, Vestibular training, Canalith repositioning, Orthotic/Fit training, DME instructions, Dry Needling, Electrical stimulation, Spinal manipulation, Spinal mobilization, Cryotherapy, Moist heat, Taping, Traction, Ultrasound, Ionotophoresis 4mg /ml Dexamethasone, Manual therapy, and Re-evaluation.  PLAN FOR NEXT SESSION: Review HEP, Progress Knee Strengthening, Hip Strengthening, Knee Flexion stretches, hamstring stretches    Lonni Pall PT, DPT Physical Therapist- [place]  03/24/2024, 10:29 AM

## 2024-03-29 ENCOUNTER — Ambulatory Visit

## 2024-03-29 DIAGNOSIS — M6281 Muscle weakness (generalized): Secondary | ICD-10-CM

## 2024-03-29 DIAGNOSIS — M25561 Pain in right knee: Secondary | ICD-10-CM | POA: Diagnosis not present

## 2024-03-29 DIAGNOSIS — G8929 Other chronic pain: Secondary | ICD-10-CM

## 2024-03-29 DIAGNOSIS — M25661 Stiffness of right knee, not elsewhere classified: Secondary | ICD-10-CM

## 2024-03-29 NOTE — Therapy (Signed)
 OUTPATIENT PHYSICAL THERAPY KNEE TREATMENT  Patient Name: Mercedes White MRN: [id number] DOB:11-06-1955, 68 y.o., female Today's Date: 03/29/2024  END OF SESSION:  PT End of Session - 03/29/24 0815     Visit Number 5    Number of Visits 17    Date for Recertification  05/10/24    PT Start Time 0815    PT Stop Time 0855    PT Time Calculation (min) 40 min    Activity Tolerance Patient tolerated treatment well    Behavior During Therapy [facility] for tasks assessed/performed          Past Medical History:  Diagnosis Date   Allergy    Arthritis    hands    Diverticulosis    Hx of adenomatous polyp of colon 02/17/2019   Migraines    with menses   Past Surgical History:  Procedure Laterality Date   CESAREAN SECTION     x1   COLONOSCOPY  2010   WISDOM TOOTH EXTRACTION     Patient Active Problem List   Diagnosis Date Noted   Establishing care with new doctor, encounter for 03/05/2024   Fall 03/05/2024   Acute pain of right knee 03/05/2024   Hyperlipidemia 03/05/2024   Finger pain, left 11/19/2022   Hx of adenomatous polyp of colon 02/17/2019   Prediabetes 12/02/2017   Osteoarthrosis, generalized, involving multiple sites 11/14/2015   Obesity (BMI 30-39.9) 11/01/2013   Routine general medical examination at a health care facility 11/01/2010   MENOPAUSAL SYNDROME 07/16/2010   Chronic seasonal allergic rhinitis due to pollen 01/14/2008   Premenstrual tension syndrome 01/13/2008    PCP: Vincente Shivers, NP  REFERRING PROVIDER: Vincente Shivers, NP  REFERRING DIAG:  [phone] (ICD-10-CM) - Acute pain of right knee      RATIONALE FOR EVALUATION AND TREATMENT: Rehabilitation  THERAPY DIAG: Stiffness of right knee, not elsewhere classified  Chronic pain of right knee  Muscle weakness (generalized)  Acute pain of right knee  ONSET DATE: October 10th 2025  FOLLOW-UP APPT SCHEDULED WITH REFERRING PROVIDER: No    SUBJECTIVE:                                                                                                                                                                                          SUBJECTIVE STATEMENT:    Patient reports to OPPT with a chief concern of L knee pain.   PERTINENT HISTORY:   Patient reports a fall on Oct 10th; she reports that she tripped on the last step (going up), her shoes caught the last step and she fell and sprained her ankle. Patient reports that she  has used tylenol and voltaren with minimal relief. She reports that's a reclined position relieves the pain however as soon as she transitions from reclined to seated the pain returns. Patient uses a recumbent elliptical in order to transition from the recliner. Patient reports that the pain prevents her from prolonged standing, sitting and she has to navigate stairs one step at a time.   Denies b/b, Saddle parasthesia, chills/fever, night sweats, nausea, vomiting,   Imaging:   EXAM: 4 VIEW(S) XRAY OF THE RIGHT KNEE 03/05/2024 09:58:31 AM   COMPARISON: None available.   CLINICAL HISTORY: acute right knee pain x 4 weeks after fall.   FINDINGS:   BONES AND JOINTS: No acute fracture. No focal osseous lesion. No joint dislocation. There is a small joint effusion. There is lateral patellofemoral compartment joint space narrowing compatible with moderate degenerative change. There are also mild degenerative changes at the lateral compartment of the knee with joint space narrowing and osteophyte formation.   SOFT TISSUES: The soft tissues are unremarkable.   IMPRESSION: 1. Small joint effusion. 2. No acute findings. 3. Moderate degenerative change of the knee.   Electronically signed by: Greig Pique MD 03/09/2024 11:31 PM EST RP Workstation: [id number]  PAIN:   Pain Intensity: Present: 0/10, Best: /10, Worst: 8/10 Pain location: R knee  Pain quality: intermittent and sharp  Radiating pain: Yes  Swelling: Yes  Numbness/Tingling: No Focal  weakness or buckling: Yes History of prior back, hip, or knee injury, pain, surgery, or therapy: No  PRECAUTIONS: Fall  WEIGHT BEARING RESTRICTIONS: No  FALLS: Has patient fallen in last 6 months? Yes. Number of falls 1  Living Environment Lives with: lives with their spouse Lives in: House/apartment Stairs: Internal 15 Step; Rail Support: Both   Prior level of function: Independent  Occupational demands: Retired   Hobbies: Taking Care of her dog  PATIENT GOALS: Patient would like to be able to walk without wobbling and get my knee stronger.    OBJECTIVE:   Patient Surveys  LEFS  Extreme difficulty/unable (0), Quite a bit of difficulty (1), Moderate difficulty (2), Little difficulty (3), No difficulty (4) Survey date:  03/15/2024  Any of your usual work, housework or school activities 3  2. Usual hobbies, recreational or sporting activities 2  3. Getting into/out of the bath 0  4. Walking between rooms 4  5. Putting on socks/shoes 3  6. Squatting  1  7. Lifting an object, like a bag of groceries from the floor 3  8. Performing light activities around your home 3  9. Performing heavy activities around your home 1  10. Getting into/out of a car 2  11. Walking 2 blocks 1  12. Walking 1 mile 0  13. Going up/down 10 stairs (1 flight) 3  14. Standing for 1 hour 2  15.  sitting for 1 hour 2  16. Running on even ground 0  17. Running on uneven ground 0  18. Making sharp turns while running fast 0  19. Hopping  0  20. Rolling over in bed 2  Score total:  28/80     Cognition WNL     Gross Musculoskeletal Assessment Tremor: None Bulk: Normal Tone: Normal  GAIT: Distance walked: 10 m Assistive device utilized: None Level of assistance: Complete Independence Comments: Decreased weight bearing on R, reciprocal gait pattern   Posture:  AROM AROM (Normal range in degrees) AROM  Hip Right Left  Flexion (125)    Extension (15)  Abduction (40)    Adduction      Internal Rotation (45)    External Rotation (45)        Knee    Flexion (135) 100 130  Extension (0) 0 0      Ankle    Dorsiflexion (20)    Plantarflexion (50)    Inversion (35)    Eversion (15    (* = pain; Blank rows = not tested)  LE MMT: MMT (out of 5) Right Left  Hip flexion 4- 4-  Hip extension    Hip abduction    Hip adduction    Hip internal rotation    Hip external rotation    Knee flexion 4- 4  Knee extension 4- 4  Ankle dorsiflexion 4+ 4+  Ankle plantarflexion 5 5  Ankle inversion    Ankle eversion    (* = pain; Blank rows = not tested)  Sensation Grossly intact to light touch bilateral LEs as determined by testing dermatomes L2-S2. Proprioception, and hot/cold testing deferred on this date.  Reflexes R/L Knee Jerk (L3/4): 2+/2+  Ankle Jerk (S1/2): 2+/2+   Muscle Length Hamstrings: R: Positive for tightness    Palpation Location LEFT  RIGHT           Quadriceps  1  Medial Hamstrings  0  Lateral Hamstrings  0  Lateral Hamstring tendon  0  Medial Hamstring tendon  0  Quadriceps tendon  0  Patella    Patellar Tendon  1  Tibial Tuberosity    Medial joint line  0  Lateral joint line  0  MCL  0  LCL  0  Adductor Tubercle    Pes Anserine tendon    Infrapatellar fat pad    Fibular head    Popliteal fossa    (Blank rows = not tested) Graded on 0-4 scale (0 = no pain, 1 = pain, 2 = pain with wincing/grimacing/flinching, 3 = pain with withdrawal, 4 = unwilling to allow palpation), (Blank rows = not tested)  Passive Accessory Motion Deferred  VASCULAR Deferred    SPECIAL TESTS  Ligamentous Stability  ACL: Lachman's: R: Negative  Anterior Drawer: R: Negative   MCL: Valgus Stress (30 degrees flexion): R: Negative  LCL: Varus Stress (30 degrees flexion): R: Negative   Functional Tests:  : .75 m/s 5TSTS: 14.07s  TODAY'S TREATMENT: DATE: 03/29/24  Subjective: Patient reports pain in her moderate pain in her knee  following PT session. Pain rated around 7/10 last night. Currently 6/10 prior to start of session.   Therapeutic Exercises:  Standing Heel Raise  2 x 20   Standing Toe Raises - Wall Supported   3 x 10  Supine Knee Flexion Stretch - Strap   30s/bout x 3 in order to improve ROM and tissue extensibility  Therapeutic Activity:  Matrix Exercise Bike Level 4-2 x 6 min (Seat 11) for LE strength and endurance for walking capacity; PT manually adjusted resistance throughout per patient tolerance  Resisted Sit to Stand - From Adjustable Bench   3 x 10 - 3 Kg MB  Lateral Stepping against resistance  4 x 24' - Red TB around ankles   Standing 3 Way Hip R/L: 2 x 8 ea dir - Red TB around ankles   TRX Squat  3 x 10    PATIENT EDUCATION:  Education details: Exercise Technique Person educated: Patient Education method: Explanation, Demonstration, and Handouts Education comprehension: verbalized understanding and returned demonstration   HOME EXERCISE PROGRAM:  Access Code: [email or web address] URL: [email or web address] Date: 03/17/2024 Prepared by: Lonni Pall  Exercises - Recumbent Bike  - 1 x daily - 3 x weekly - 1 sets - 10 min hold - Seated Piriformis Stretch  - 2 x daily - 7 x weekly - 3 sets - 15- 30s hold - Seated Hamstring Stretch  - 2 x daily - 7 x weekly - 3 sets - 15-30s hold - Seated Knee Flexion Stretch  - 1 x daily - 7 x weekly - 3 sets - 30s hold - Mini Squat with Counter Support  - 2 x daily - 3-4 x weekly - 2 sets - 10-12 reps - Supine Heel Slide with Strap  - 2 x daily - 7 x weekly - 3 sets - 30s hold - Seated Long Arc Quad  - 1 x daily - 3-4 x weekly - 2-3 sets - 10-15 reps  Access Code: [email or web address] URL: [email or web address] Date: 03/15/2024 Prepared by: Lonni Pall  Exercises - Seated Piriformis Stretch  - 2 x daily - 7 x weekly - 3 sets - 15- 30s hold - Seated Piriformis Stretch  - 2 x daily - 7 x weekly - 2-3 sets - 30s hold - Seated  Hamstring Stretch  - 2 x daily - 7 x weekly - 3 sets - 15-30s hold - Mini Squat with Counter Support  - 2 x daily - 3-4 x weekly - 2 sets - 10-12 reps  ASSESSMENT:  CLINICAL IMPRESSION: Continued PT POC in management of R knee pain. Patient tolerated all progressions to PT interventions without additional pain in the R knee. Patient tolerated increased SLS stance on the R knee during ankle 3 way.  Good carryover with knee flexion stretch with ankle strap in supine position. She also demonstrated good squat technique with minimal use of UE.  Patient's R knee flexion still significantly less than L knee secondary to pain. PT plans to continue knee/hip strengthening in order to provide improved muscular support to R knee. Based on today's performance patient will benefit from skilled PT in order to address current deficits and improve Quality of life.   OBJECTIVE IMPAIRMENTS: Abnormal gait, cardiopulmonary status limiting activity, decreased endurance, difficulty walking, decreased ROM, decreased strength, and pain.   ACTIVITY LIMITATIONS: sitting, standing, squatting, stairs, and transfers  PARTICIPATION LIMITATIONS: community activity and yard work  PERSONAL FACTORS: Age, Past/current experiences, and Time since onset of injury/illness/exacerbation are also affecting patient's functional outcome.   REHAB POTENTIAL: Good  CLINICAL DECISION MAKING: Evolving/moderate complexity  EVALUATION COMPLEXITY: Moderate   GOALS: Goals reviewed with patient? Yes  SHORT TERM GOALS: Target date: 04/26/2024  Pt will be independent with HEP to improve strength and decrease knee pain to improve pain-free function at home and work. Baseline: 03/15/2024: Initial HEP provided Goal status: INITIAL   LONG TERM GOALS: Target date: 05/24/2024  Pt will decrease 5TSTS by at least 3 seconds in order to demonstrate clinically significant improvement in LE strength  Baseline: 03/15/2024: 14.07s Goal status:  INITIAL  2.  Pt will decrease worst knee pain by at least 3 points on the NPRS in order to demonstrate clinically significant reduction in knee pain. Baseline: 03/15/2024: 8/10 NPS Goal status: INITIAL  3.  Pt will increase LEFS score by at least 9 points in order demonstrate clinically significant reduction in knee pain/disability.       Baseline: 03/15/2024: 28/80 (80/80 = max disability)  Goal status: INITIAL  4.  Pt will increase strength of Knee flexion/ext to a  4+ grade bilaterally in order to demonstrate improvement in strength and function  Baseline: 03/15/2024:   R L  Knee flexion 4- 4  Knee extension 4- 4   Goal status: INITIAL   PLAN: PT FREQUENCY: 1-2x/week  PT DURATION: 8 weeks  PLANNED INTERVENTIONS: Therapeutic exercises, Therapeutic activity, Neuromuscular re-education, Balance training, Gait training, Patient/Family education, Self Care, Joint mobilization, Joint manipulation, Vestibular training, Canalith repositioning, Orthotic/Fit training, DME instructions, Dry Needling, Electrical stimulation, Spinal manipulation, Spinal mobilization, Cryotherapy, Moist heat, Taping, Traction, Ultrasound, Ionotophoresis 4mg /ml Dexamethasone, Manual therapy, and Re-evaluation.  PLAN FOR NEXT SESSION: Review HEP, Initiate Knee Strengthening, Hip Strengthening, Knee Flexion stretches, hamstring stretches    Lonni Pall PT, DPT Physical Therapist- [place]  03/29/2024, 8:16 AM  MRN: [id number] DOB:October 07, 1955, 68 y.o., female Today's Date: 03/29/2024  END OF SESSION:  PT End of Session - 03/29/24 0815     Visit Number 5    Number of Visits 17    Date for Recertification  05/10/24    PT Start Time 0815    PT Stop Time 0855    PT Time Calculation (min) 40 min    Activity Tolerance Patient tolerated treatment well    Behavior During Therapy [facility] for tasks assessed/performed           Past Medical History:  Diagnosis Date   Allergy    Arthritis    hands     Diverticulosis    Hx of adenomatous polyp of colon 02/17/2019   Migraines    with menses   Past Surgical History:  Procedure Laterality Date   CESAREAN SECTION     x1   COLONOSCOPY  2010   WISDOM TOOTH EXTRACTION     Patient Active Problem List   Diagnosis Date Noted   Establishing care with new doctor, encounter for 03/05/2024   Fall 03/05/2024   Acute pain of right knee 03/05/2024   Hyperlipidemia 03/05/2024   Finger pain, left 11/19/2022   Hx of adenomatous polyp of colon 02/17/2019   Prediabetes 12/02/2017   Osteoarthrosis, generalized, involving multiple sites 11/14/2015   Obesity (BMI 30-39.9) 11/01/2013   Routine general medical examination at a health care facility 11/01/2010   MENOPAUSAL SYNDROME 07/16/2010   Chronic seasonal allergic rhinitis due to pollen 01/14/2008   Premenstrual tension syndrome 01/13/2008    PCP: Vincente Shivers, NP  REFERRING PROVIDER: Vincente Shivers, NP  REFERRING DIAG:  [phone] (ICD-10-CM) - Acute pain of right knee      RATIONALE FOR EVALUATION AND TREATMENT: Rehabilitation  THERAPY DIAG: Stiffness of right knee, not elsewhere classified  Chronic pain of right knee  Muscle weakness (generalized)  Acute pain of right knee  ONSET DATE: October 10th 2025  FOLLOW-UP APPT SCHEDULED WITH REFERRING PROVIDER: No    SUBJECTIVE:  SUBJECTIVE STATEMENT:    Patient reports to OPPT with a chief concern of L knee pain.   PERTINENT HISTORY:   Patient reports a fall on Oct 10th; she reports that she tripped on the last step (going up), her shoes caught the last step and she fell and sprained her ankle. Patient reports that she has used tylenol and voltaren with minimal relief. She reports that's a reclined position relieves the pain however as soon as she  transitions from reclined to seated the pain returns. Patient uses a recumbent elliptical in order to transition from the recliner. Patient reports that the pain prevents her from prolonged standing, sitting and she has to navigate stairs one step at a time.   Denies b/b, Saddle parasthesia, chills/fever, night sweats, nausea, vomiting,   Imaging:   EXAM: 4 VIEW(S) XRAY OF THE RIGHT KNEE 03/05/2024 09:58:31 AM   COMPARISON: None available.   CLINICAL HISTORY: acute right knee pain x 4 weeks after fall.   FINDINGS:   BONES AND JOINTS: No acute fracture. No focal osseous lesion. No joint dislocation. There is a small joint effusion. There is lateral patellofemoral compartment joint space narrowing compatible with moderate degenerative change. There are also mild degenerative changes at the lateral compartment of the knee with joint space narrowing and osteophyte formation.   SOFT TISSUES: The soft tissues are unremarkable.   IMPRESSION: 1. Small joint effusion. 2. No acute findings. 3. Moderate degenerative change of the knee.   Electronically signed by: Greig Pique MD 03/09/2024 11:31 PM EST RP Workstation: [id number]  PAIN:   Pain Intensity: Present: 0/10, Best: /10, Worst: 8/10 Pain location: R knee  Pain quality: intermittent and sharp  Radiating pain: Yes  Swelling: Yes  Numbness/Tingling: No Focal weakness or buckling: Yes History of prior back, hip, or knee injury, pain, surgery, or therapy: No  PRECAUTIONS: Fall  WEIGHT BEARING RESTRICTIONS: No  FALLS: Has patient fallen in last 6 months? Yes. Number of falls 1  Living Environment Lives with: lives with their spouse Lives in: House/apartment Stairs: Internal 15 Step; Rail Support: Both   Prior level of function: Independent  Occupational demands: Retired   Hobbies: Taking Care of her dog  PATIENT GOALS: Patient would like to be able to walk without wobbling and get my knee stronger.     OBJECTIVE:   Patient Surveys  LEFS  Extreme difficulty/unable (0), Quite a bit of difficulty (1), Moderate difficulty (2), Little difficulty (3), No difficulty (4) Survey date:  03/15/2024  Any of your usual work, housework or school activities 3  2. Usual hobbies, recreational or sporting activities 2  3. Getting into/out of the bath 0  4. Walking between rooms 4  5. Putting on socks/shoes 3  6. Squatting  1  7. Lifting an object, like a bag of groceries from the floor 3  8. Performing light activities around your home 3  9. Performing heavy activities around your home 1  10. Getting into/out of a car 2  11. Walking 2 blocks 1  12. Walking 1 mile 0  13. Going up/down 10 stairs (1 flight) 3  14. Standing for 1 hour 2  15.  sitting for 1 hour 2  16. Running on even ground 0  17. Running on uneven ground 0  18. Making sharp turns while running fast 0  19. Hopping  0  20. Rolling over in bed 2  Score total:  28/80     Cognition WNL  Gross Musculoskeletal Assessment Tremor: None Bulk: Normal Tone: Normal  GAIT: Distance walked: 10 m Assistive device utilized: None Level of assistance: Complete Independence Comments: Decreased weight bearing on R, reciprocal gait pattern   Posture:  AROM AROM (Normal range in degrees) AROM  Hip Right Left  Flexion (125)    Extension (15)    Abduction (40)    Adduction     Internal Rotation (45)    External Rotation (45)        Knee    Flexion (135) 100 130  Extension (0) 0 0      Ankle    Dorsiflexion (20)    Plantarflexion (50)    Inversion (35)    Eversion (15    (* = pain; Blank rows = not tested)  LE MMT: MMT (out of 5) Right Left  Hip flexion 4- 4-  Hip extension    Hip abduction    Hip adduction    Hip internal rotation    Hip external rotation    Knee flexion 4- 4  Knee extension 4- 4  Ankle dorsiflexion 4+ 4+  Ankle plantarflexion 5 5  Ankle inversion    Ankle eversion    (* = pain; Blank  rows = not tested)  Sensation Grossly intact to light touch bilateral LEs as determined by testing dermatomes L2-S2. Proprioception, and hot/cold testing deferred on this date.  Reflexes R/L Knee Jerk (L3/4): 2+/2+  Ankle Jerk (S1/2): 2+/2+   Muscle Length Hamstrings: R: Positive for tightness    Palpation Location LEFT  RIGHT           Quadriceps  1  Medial Hamstrings  0  Lateral Hamstrings  0  Lateral Hamstring tendon  0  Medial Hamstring tendon  0  Quadriceps tendon  0  Patella    Patellar Tendon  1  Tibial Tuberosity    Medial joint line  0  Lateral joint line  0  MCL  0  LCL  0  Adductor Tubercle    Pes Anserine tendon    Infrapatellar fat pad    Fibular head    Popliteal fossa    (Blank rows = not tested) Graded on 0-4 scale (0 = no pain, 1 = pain, 2 = pain with wincing/grimacing/flinching, 3 = pain with withdrawal, 4 = unwilling to allow palpation), (Blank rows = not tested)  Passive Accessory Motion Deferred  VASCULAR Deferred    SPECIAL TESTS  Ligamentous Stability  ACL: Lachman's: R: Negative  Anterior Drawer: R: Negative   MCL: Valgus Stress (30 degrees flexion): R: Negative  LCL: Varus Stress (30 degrees flexion): R: Negative   Functional Tests:  : .75 m/s 5TSTS: 14.07s  TODAY'S TREATMENT: DATE: 03/29/24   Subjective: Patient reports 5/10 NPS currently; she felt 6-7/10 NPS in the the AM. She reports that she still has persistent catch in her R knee.  No further questions or concerns.    Therapeutic Exercises:   Omega Cable Machine    Knee Extension    3 x 10 - 25#    Hamstring Curl    3 x 10 - 25#   Standing Calve Raise and Drop (1st Stair step) - BUE Support   2 x 12 reps   Seated Knee Flexion Stretch  30s/bout x 2 in order to improve ROM and tissue extensibility  PT education on knee anatomy and patellofemoral tracking that could be contributing to medial knee pain.     Therapeutic Activity:   NuStep  L3-2 x 5  min x LE only  (Seat 9) for LE endurance and strength; PT manually adjusted resistance throughout bout.   Lateral Walking against resistance (Patient equipment)   2 x 12' - Low Resistance band (18lb) around ankles    2 x 12' - Medium Resistance band (25 lbs) around thighs   Standing 3-Way Hip (BUE support on cubicle)   R: 2 x 8 ea dir (flexion, abd, ext) - Medium Resistance band (Pt equip) around thighs  PATIENT EDUCATION:  Education details: Exercise Technique  Person educated: Patient Education method: Explanation, Demonstration, and Handouts Education comprehension: verbalized understanding and returned demonstration   HOME EXERCISE PROGRAM:  Access Code: [email or web address] URL: [email or web address] Date: 03/29/2024 Prepared by: Lonni Pall  Exercises - Recumbent Bike  - 2 x daily - 7 x weekly - 1 sets - 10 min hold - Seated Piriformis Stretch  - 2 x daily - 7 x weekly - 3 sets - 15- 30s hold - Seated Hamstring Stretch  - 2 x daily - 7 x weekly - 3 sets - 15-30s hold - Seated Knee Flexion Stretch  - 1 x daily - 7 x weekly - 3 sets - 30s hold - Mini Squat with Counter Support  - 2 x daily - 3-4 x weekly - 2 sets - 10-12 reps - Seated Long Arc Quad  - 1 x daily - 3-4 x weekly - 2-3 sets - 10-15 reps - Forward Step Up  - 1 x daily - 3-4 x weekly - 2-3 sets - 10 reps - Supine Heel Slide with Strap  - 2 x daily - 7 x weekly - 3 sets - 30s hold  ASSESSMENT:  CLINICAL IMPRESSION: Patient reports to OPPT for continued management of R knee pain. Good tolerance to all exercises without exacerbation of knee pain. Educated patient on how to use her own resistance bands with HEP. Some PT education on patellofemoral joint and how medial patellar tracking onto femoral condyle could affect walking. Patient still presenting with R knee pain preventing her from prolonged standing, walking, and participating in community activities.  Based on today's performance, pt will continue to  benefit from skilled PT in order to facilitate return to PLOF and improve QoL.   She tolerated all progressions to PT interventions without report of additional pain in the R knee. Good form with squat form with minimal use of UE support. Good ability to maintain SLS during 3 way hip exercises. She still has intermittent pain in the R knee preventing her from prolonged standing, walking and participating in community activities.     OBJECTIVE IMPAIRMENTS: Abnormal gait, cardiopulmonary status limiting activity, decreased endurance, difficulty walking, decreased ROM, decreased strength, and pain.   ACTIVITY LIMITATIONS: sitting, standing, squatting, stairs, and transfers  PARTICIPATION LIMITATIONS: community activity and yard work  PERSONAL FACTORS: Age, Past/current experiences, and Time since onset of injury/illness/exacerbation are also affecting patient's functional outcome.   REHAB POTENTIAL: Good  CLINICAL DECISION MAKING: Evolving/moderate complexity  EVALUATION COMPLEXITY: Moderate   GOALS: Goals reviewed with patient? Yes  SHORT TERM GOALS: Target date: 04/26/2024  Pt will be independent with HEP to improve strength and decrease knee pain to improve pain-free function at home and work. Baseline: 03/15/2024: Initial HEP provided Goal status: INITIAL   LONG TERM GOALS: Target date: 05/24/2024  Pt will decrease 5TSTS by at least 3 seconds in order to demonstrate clinically significant improvement in LE strength  Baseline: 03/15/2024: 14.07s Goal status: INITIAL  2.  Pt  will decrease worst knee pain by at least 3 points on the NPRS in order to demonstrate clinically significant reduction in knee pain. Baseline: 03/15/2024: 8/10 NPS Goal status: INITIAL  3.  Pt will increase LEFS score by at least 9 points in order demonstrate clinically significant reduction in knee pain/disability.       Baseline: 03/15/2024: 28/80 (80/80 = max disability)  Goal status: INITIAL  4.   Pt will increase strength of Knee flexion/ext to a 4+ grade bilaterally in order to demonstrate improvement in strength and function  Baseline: 03/15/2024:   R L  Knee flexion 4- 4  Knee extension 4- 4   Goal status: INITIAL   PLAN: PT FREQUENCY: 1-2x/week  PT DURATION: 8 weeks  PLANNED INTERVENTIONS: Therapeutic exercises, Therapeutic activity, Neuromuscular re-education, Balance training, Gait training, Patient/Family education, Self Care, Joint mobilization, Joint manipulation, Vestibular training, Canalith repositioning, Orthotic/Fit training, DME instructions, Dry Needling, Electrical stimulation, Spinal manipulation, Spinal mobilization, Cryotherapy, Moist heat, Taping, Traction, Ultrasound, Ionotophoresis 4mg /ml Dexamethasone, Manual therapy, and Re-evaluation.  PLAN FOR NEXT SESSION: Review HEP, Progress Knee Strengthening, Hip Strengthening, Knee Flexion stretches, hamstring stretches    Lonni Pall PT, DPT Physical Therapist- [place]  03/29/2024, 8:16 AM

## 2024-04-05 ENCOUNTER — Ambulatory Visit

## 2024-04-05 DIAGNOSIS — M25561 Pain in right knee: Secondary | ICD-10-CM | POA: Insufficient documentation

## 2024-04-05 DIAGNOSIS — M6281 Muscle weakness (generalized): Secondary | ICD-10-CM | POA: Diagnosis not present

## 2024-04-05 DIAGNOSIS — N6321 Unspecified lump in the left breast, upper outer quadrant: Secondary | ICD-10-CM | POA: Insufficient documentation

## 2024-04-05 DIAGNOSIS — G8929 Other chronic pain: Secondary | ICD-10-CM | POA: Diagnosis present

## 2024-04-05 DIAGNOSIS — M25661 Stiffness of right knee, not elsewhere classified: Secondary | ICD-10-CM | POA: Insufficient documentation

## 2024-04-05 DIAGNOSIS — R928 Other abnormal and inconclusive findings on diagnostic imaging of breast: Secondary | ICD-10-CM | POA: Diagnosis present

## 2024-04-05 NOTE — Therapy (Signed)
 OUTPATIENT PHYSICAL THERAPY KNEE TREATMENT  Patient Name: Mercedes White MRN: [id number] DOB:29-Jun-1955, 68 y.o., female Today's Date: 04/05/2024  END OF SESSION:  PT End of Session - 04/05/24 0812     Visit Number 6    Number of Visits 17    Date for Recertification  05/10/24    PT Start Time 0815    PT Stop Time 0855    PT Time Calculation (min) 40 min    Activity Tolerance Patient tolerated treatment well    Behavior During Therapy [facility] for tasks assessed/performed          Past Medical History:  Diagnosis Date   Allergy    Arthritis    hands    Diverticulosis    Hx of adenomatous polyp of colon 02/17/2019   Migraines    with menses   Past Surgical History:  Procedure Laterality Date   CESAREAN SECTION     x1   COLONOSCOPY  2010   WISDOM TOOTH EXTRACTION     Patient Active Problem List   Diagnosis Date Noted   Establishing care with new doctor, encounter for 03/05/2024   Fall 03/05/2024   Acute pain of right knee 03/05/2024   Hyperlipidemia 03/05/2024   Finger pain, left 11/19/2022   Hx of adenomatous polyp of colon 02/17/2019   Prediabetes 12/02/2017   Osteoarthrosis, generalized, involving multiple sites 11/14/2015   Obesity (BMI 30-39.9) 11/01/2013   Routine general medical examination at a health care facility 11/01/2010   MENOPAUSAL SYNDROME 07/16/2010   Chronic seasonal allergic rhinitis due to pollen 01/14/2008   Premenstrual tension syndrome 01/13/2008    PCP: Vincente Shivers, NP  REFERRING PROVIDER: Vincente Shivers, NP  REFERRING DIAG:  [phone] (ICD-10-CM) - Acute pain of right knee      RATIONALE FOR EVALUATION AND TREATMENT: Rehabilitation  THERAPY DIAG: Chronic pain of right knee  Stiffness of right knee, not elsewhere classified  Muscle weakness (generalized)  ONSET DATE: October 10th 2025  FOLLOW-UP APPT SCHEDULED WITH REFERRING PROVIDER: No    SUBJECTIVE:                                                                                                                                                                                          SUBJECTIVE STATEMENT:    Patient reports to OPPT with a chief concern of L knee pain.   PERTINENT HISTORY:   Patient reports a fall on Oct 10th; she reports that she tripped on the last step (going up), her shoes caught the last step and she fell and sprained her ankle. Patient reports that she has used tylenol and voltaren with  minimal relief. She reports that's a reclined position relieves the pain however as soon as she transitions from reclined to seated the pain returns. Patient uses a recumbent elliptical in order to transition from the recliner. Patient reports that the pain prevents her from prolonged standing, sitting and she has to navigate stairs one step at a time.   Denies b/b, Saddle parasthesia, chills/fever, night sweats, nausea, vomiting,   Imaging:   EXAM: 4 VIEW(S) XRAY OF THE RIGHT KNEE 03/05/2024 09:58:31 AM   COMPARISON: None available.   CLINICAL HISTORY: acute right knee pain x 4 weeks after fall.   FINDINGS:   BONES AND JOINTS: No acute fracture. No focal osseous lesion. No joint dislocation. There is a small joint effusion. There is lateral patellofemoral compartment joint space narrowing compatible with moderate degenerative change. There are also mild degenerative changes at the lateral compartment of the knee with joint space narrowing and osteophyte formation.   SOFT TISSUES: The soft tissues are unremarkable.   IMPRESSION: 1. Small joint effusion. 2. No acute findings. 3. Moderate degenerative change of the knee.   Electronically signed by: Greig Pique MD 03/09/2024 11:31 PM EST RP Workstation: [id number]  PAIN:   Pain Intensity: Present: 0/10, Best: /10, Worst: 8/10 Pain location: R knee  Pain quality: intermittent and sharp  Radiating pain: Yes  Swelling: Yes  Numbness/Tingling: No Focal weakness or buckling:  Yes History of prior back, hip, or knee injury, pain, surgery, or therapy: No  PRECAUTIONS: Fall  WEIGHT BEARING RESTRICTIONS: No  FALLS: Has patient fallen in last 6 months? Yes. Number of falls 1  Living Environment Lives with: lives with their spouse Lives in: House/apartment Stairs: Internal 15 Step; Rail Support: Both   Prior level of function: Independent  Occupational demands: Retired   Hobbies: Taking Care of her dog  PATIENT GOALS: Patient would like to be able to walk without wobbling and get my knee stronger.    OBJECTIVE:   Patient Surveys  LEFS  Extreme difficulty/unable (0), Quite a bit of difficulty (1), Moderate difficulty (2), Little difficulty (3), No difficulty (4) Survey date:  03/15/2024  Any of your usual work, housework or school activities 3  2. Usual hobbies, recreational or sporting activities 2  3. Getting into/out of the bath 0  4. Walking between rooms 4  5. Putting on socks/shoes 3  6. Squatting  1  7. Lifting an object, like a bag of groceries from the floor 3  8. Performing light activities around your home 3  9. Performing heavy activities around your home 1  10. Getting into/out of a car 2  11. Walking 2 blocks 1  12. Walking 1 mile 0  13. Going up/down 10 stairs (1 flight) 3  14. Standing for 1 hour 2  15.  sitting for 1 hour 2  16. Running on even ground 0  17. Running on uneven ground 0  18. Making sharp turns while running fast 0  19. Hopping  0  20. Rolling over in bed 2  Score total:  28/80     Cognition WNL     Gross Musculoskeletal Assessment Tremor: None Bulk: Normal Tone: Normal  GAIT: Distance walked: 10 m Assistive device utilized: None Level of assistance: Complete Independence Comments: Decreased weight bearing on R, reciprocal gait pattern   Posture:  AROM AROM (Normal range in degrees) AROM  Hip Right Left  Flexion (125)    Extension (15)    Abduction (40)    Adduction  Internal Rotation  (45)    External Rotation (45)        Knee    Flexion (135) 100 130  Extension (0) 0 0      Ankle    Dorsiflexion (20)    Plantarflexion (50)    Inversion (35)    Eversion (15    (* = pain; Blank rows = not tested)  LE MMT: MMT (out of 5) Right Left  Hip flexion 4- 4-  Hip extension    Hip abduction    Hip adduction    Hip internal rotation    Hip external rotation    Knee flexion 4- 4  Knee extension 4- 4  Ankle dorsiflexion 4+ 4+  Ankle plantarflexion 5 5  Ankle inversion    Ankle eversion    (* = pain; Blank rows = not tested)  Sensation Grossly intact to light touch bilateral LEs as determined by testing dermatomes L2-S2. Proprioception, and hot/cold testing deferred on this date.  Reflexes R/L Knee Jerk (L3/4): 2+/2+  Ankle Jerk (S1/2): 2+/2+   Muscle Length Hamstrings: R: Positive for tightness    Palpation Location LEFT  RIGHT           Quadriceps  1  Medial Hamstrings  0  Lateral Hamstrings  0  Lateral Hamstring tendon  0  Medial Hamstring tendon  0  Quadriceps tendon  0  Patella    Patellar Tendon  1  Tibial Tuberosity    Medial joint line  0  Lateral joint line  0  MCL  0  LCL  0  Adductor Tubercle    Pes Anserine tendon    Infrapatellar fat pad    Fibular head    Popliteal fossa    (Blank rows = not tested) Graded on 0-4 scale (0 = no pain, 1 = pain, 2 = pain with wincing/grimacing/flinching, 3 = pain with withdrawal, 4 = unwilling to allow palpation), (Blank rows = not tested)  Passive Accessory Motion Deferred  VASCULAR Deferred    SPECIAL TESTS  Ligamentous Stability  ACL: Lachman's: R: Negative  Anterior Drawer: R: Negative   MCL: Valgus Stress (30 degrees flexion): R: Negative  LCL: Varus Stress (30 degrees flexion): R: Negative   Functional Tests:  : .75 m/s 5TSTS: 14.07s  TODAY'S TREATMENT: DATE: 04/05/24  Subjective: Patient reports pain in her moderate pain in her knee following PT session. Pain  rated around 7/10 last night. Currently 6/10 prior to start of session.   Therapeutic Exercises:  Standing Heel Raise  2 x 20   Standing Toe Raises - Wall Supported   3 x 10  Supine Knee Flexion Stretch - Strap   30s/bout x 3 in order to improve ROM and tissue extensibility  Therapeutic Activity:  Matrix Exercise Bike Level 4-2 x 6 min (Seat 11) for LE strength and endurance for walking capacity; PT manually adjusted resistance throughout per patient tolerance  Resisted Sit to Stand - From Adjustable Bench   3 x 10 - 3 Kg MB  Lateral Stepping against resistance  4 x 24' - Red TB around ankles   Standing 3 Way Hip R/L: 2 x 8 ea dir - Red TB around ankles   TRX Squat  3 x 10    PATIENT EDUCATION:  Education details: Exercise Technique Person educated: Patient Education method: Explanation, Demonstration, and Handouts Education comprehension: verbalized understanding and returned demonstration   HOME EXERCISE PROGRAM:  Access Code: [email or web address] URL: [email or web address] Date: 03/17/2024 Prepared by:  Jannelle Notaro  Exercises - Recumbent Bike  - 1 x daily - 3 x weekly - 1 sets - 10 min hold - Seated Piriformis Stretch  - 2 x daily - 7 x weekly - 3 sets - 15- 30s hold - Seated Hamstring Stretch  - 2 x daily - 7 x weekly - 3 sets - 15-30s hold - Seated Knee Flexion Stretch  - 1 x daily - 7 x weekly - 3 sets - 30s hold - Mini Squat with Counter Support  - 2 x daily - 3-4 x weekly - 2 sets - 10-12 reps - Supine Heel Slide with Strap  - 2 x daily - 7 x weekly - 3 sets - 30s hold - Seated Long Arc Quad  - 1 x daily - 3-4 x weekly - 2-3 sets - 10-15 reps  Access Code: [email or web address] URL: [email or web address] Date: 03/15/2024 Prepared by: Lonni Pall  Exercises - Seated Piriformis Stretch  - 2 x daily - 7 x weekly - 3 sets - 15- 30s hold - Seated Piriformis Stretch  - 2 x daily - 7 x weekly - 2-3 sets - 30s hold - Seated Hamstring Stretch  - 2 x  daily - 7 x weekly - 3 sets - 15-30s hold - Mini Squat with Counter Support  - 2 x daily - 3-4 x weekly - 2 sets - 10-12 reps  ASSESSMENT:  CLINICAL IMPRESSION: Continued PT POC in management of R knee pain. Patient tolerated all progressions to PT interventions without additional pain in the R knee. Patient tolerated increased SLS stance on the R knee during ankle 3 way.  Good carryover with knee flexion stretch with ankle strap in supine position. She also demonstrated good squat technique with minimal use of UE.  Patient's R knee flexion still significantly less than L knee secondary to pain. PT plans to continue knee/hip strengthening in order to provide improved muscular support to R knee. Based on today's performance patient will benefit from skilled PT in order to address current deficits and improve Quality of life.   OBJECTIVE IMPAIRMENTS: Abnormal gait, cardiopulmonary status limiting activity, decreased endurance, difficulty walking, decreased ROM, decreased strength, and pain.   ACTIVITY LIMITATIONS: sitting, standing, squatting, stairs, and transfers  PARTICIPATION LIMITATIONS: community activity and yard work  PERSONAL FACTORS: Age, Past/current experiences, and Time since onset of injury/illness/exacerbation are also affecting patient's functional outcome.   REHAB POTENTIAL: Good  CLINICAL DECISION MAKING: Evolving/moderate complexity  EVALUATION COMPLEXITY: Moderate   GOALS: Goals reviewed with patient? Yes  SHORT TERM GOALS: Target date: 05/03/2024  Pt will be independent with HEP to improve strength and decrease knee pain to improve pain-free function at home and work. Baseline: 03/15/2024: Initial HEP provided Goal status: INITIAL   LONG TERM GOALS: Target date: 05/31/2024  Pt will decrease 5TSTS by at least 3 seconds in order to demonstrate clinically significant improvement in LE strength  Baseline: 03/15/2024: 14.07s Goal status: INITIAL  2.  Pt will  decrease worst knee pain by at least 3 points on the NPRS in order to demonstrate clinically significant reduction in knee pain. Baseline: 03/15/2024: 8/10 NPS Goal status: INITIAL  3.  Pt will increase LEFS score by at least 9 points in order demonstrate clinically significant reduction in knee pain/disability.       Baseline: 03/15/2024: 28/80 (80/80 = max disability)  Goal status: INITIAL  4.  Pt will increase strength of Knee flexion/ext to a 4+ grade bilaterally in order to demonstrate improvement in  strength and function  Baseline: 03/15/2024:   R L  Knee flexion 4- 4  Knee extension 4- 4   Goal status: INITIAL   PLAN: PT FREQUENCY: 1-2x/week  PT DURATION: 8 weeks  PLANNED INTERVENTIONS: Therapeutic exercises, Therapeutic activity, Neuromuscular re-education, Balance training, Gait training, Patient/Family education, Self Care, Joint mobilization, Joint manipulation, Vestibular training, Canalith repositioning, Orthotic/Fit training, DME instructions, Dry Needling, Electrical stimulation, Spinal manipulation, Spinal mobilization, Cryotherapy, Moist heat, Taping, Traction, Ultrasound, Ionotophoresis 4mg /ml Dexamethasone, Manual therapy, and Re-evaluation.  PLAN FOR NEXT SESSION: Review HEP, Initiate Knee Strengthening, Hip Strengthening, Knee Flexion stretches, hamstring stretches    Lonni Pall PT, DPT Physical Therapist-   04/05/2024, 8:13 AM  MRN: [id number] DOB:[birth year], 68 y.o., female Today's Date: 04/05/2024  END OF SESSION:  PT End of Session - 04/05/24 0812     Visit Number 6    Number of Visits 17    Date for Recertification  05/10/24    PT Start Time 0815    PT Stop Time 0855    PT Time Calculation (min) 40 min    Activity Tolerance Patient tolerated treatment well    Behavior During Therapy [facility] for tasks assessed/performed           Past Medical History:  Diagnosis Date   Allergy    Arthritis    hands    Diverticulosis    Hx of  adenomatous polyp of colon 02/17/2019   Migraines    with menses   Past Surgical History:  Procedure Laterality Date   CESAREAN SECTION     x1   COLONOSCOPY  2010   WISDOM TOOTH EXTRACTION     Patient Active Problem List   Diagnosis Date Noted   Establishing care with new doctor, encounter for 03/05/2024   Fall 03/05/2024   Acute pain of right knee 03/05/2024   Hyperlipidemia 03/05/2024   Finger pain, left 11/19/2022   Hx of adenomatous polyp of colon 02/17/2019   Prediabetes 12/02/2017   Osteoarthrosis, generalized, involving multiple sites 11/14/2015   Obesity (BMI 30-39.9) 11/01/2013   Routine general medical examination at a health care facility 11/01/2010   MENOPAUSAL SYNDROME 07/16/2010   Chronic seasonal allergic rhinitis due to pollen 01/14/2008   Premenstrual tension syndrome 01/13/2008    PCP: Vincente Shivers, NP  REFERRING PROVIDER: Vincente Shivers, NP  REFERRING DIAG:  [phone] (ICD-10-CM) - Acute pain of right knee      RATIONALE FOR EVALUATION AND TREATMENT: Rehabilitation  THERAPY DIAG: Chronic pain of right knee  Stiffness of right knee, not elsewhere classified  Muscle weakness (generalized)  ONSET DATE: October 10th 2025  FOLLOW-UP APPT SCHEDULED WITH REFERRING PROVIDER: No    SUBJECTIVE:  SUBJECTIVE STATEMENT:    Patient reports to OPPT with a chief concern of L knee pain.   PERTINENT HISTORY:   Patient reports a fall on Oct 10th; she reports that she tripped on the last step (going up), her shoes caught the last step and she fell and sprained her ankle. Patient reports that she has used tylenol and voltaren with minimal relief. She reports that's a reclined position relieves the pain however as soon as she transitions from reclined to seated the pain returns.  Patient uses a recumbent elliptical in order to transition from the recliner. Patient reports that the pain prevents her from prolonged standing, sitting and she has to navigate stairs one step at a time.   Denies b/b, Saddle parasthesia, chills/fever, night sweats, nausea, vomiting,   Imaging:   EXAM: 4 VIEW(S) XRAY OF THE RIGHT KNEE 03/05/2024 09:58:31 AM   COMPARISON: None available.   CLINICAL HISTORY: acute right knee pain x 4 weeks after fall.   FINDINGS:   BONES AND JOINTS: No acute fracture. No focal osseous lesion. No joint dislocation. There is a small joint effusion. There is lateral patellofemoral compartment joint space narrowing compatible with moderate degenerative change. There are also mild degenerative changes at the lateral compartment of the knee with joint space narrowing and osteophyte formation.   SOFT TISSUES: The soft tissues are unremarkable.   IMPRESSION: 1. Small joint effusion. 2. No acute findings. 3. Moderate degenerative change of the knee.   Electronically signed by: Greig Pique MD 03/09/2024 11:31 PM EST RP Workstation: [id number]  PAIN:   Pain Intensity: Present: 0/10, Best: /10, Worst: 8/10 Pain location: R knee  Pain quality: intermittent and sharp  Radiating pain: Yes  Swelling: Yes  Numbness/Tingling: No Focal weakness or buckling: Yes History of prior back, hip, or knee injury, pain, surgery, or therapy: No  PRECAUTIONS: Fall  WEIGHT BEARING RESTRICTIONS: No  FALLS: Has patient fallen in last 6 months? Yes. Number of falls 1  Living Environment Lives with: lives with their spouse Lives in: House/apartment Stairs: Internal 15 Step; Rail Support: Both   Prior level of function: Independent  Occupational demands: Retired   Hobbies: Taking Care of her dog  PATIENT GOALS: Patient would like to be able to walk without wobbling and get my knee stronger.    OBJECTIVE:   Patient Surveys  LEFS  Extreme  difficulty/unable (0), Quite a bit of difficulty (1), Moderate difficulty (2), Little difficulty (3), No difficulty (4) Survey date:  03/15/2024  Any of your usual work, housework or school activities 3  2. Usual hobbies, recreational or sporting activities 2  3. Getting into/out of the bath 0  4. Walking between rooms 4  5. Putting on socks/shoes 3  6. Squatting  1  7. Lifting an object, like a bag of groceries from the floor 3  8. Performing light activities around your home 3  9. Performing heavy activities around your home 1  10. Getting into/out of a car 2  11. Walking 2 blocks 1  12. Walking 1 mile 0  13. Going up/down 10 stairs (1 flight) 3  14. Standing for 1 hour 2  15.  sitting for 1 hour 2  16. Running on even ground 0  17. Running on uneven ground 0  18. Making sharp turns while running fast 0  19. Hopping  0  20. Rolling over in bed 2  Score total:  28/80     Cognition WNL  Gross Musculoskeletal Assessment Tremor: None Bulk: Normal Tone: Normal  GAIT: Distance walked: 10 m Assistive device utilized: None Level of assistance: Complete Independence Comments: Decreased weight bearing on R, reciprocal gait pattern   Posture:  AROM AROM (Normal range in degrees) AROM  Hip Right Left  Flexion (125)    Extension (15)    Abduction (40)    Adduction     Internal Rotation (45)    External Rotation (45)        Knee    Flexion (135) 100 130  Extension (0) 0 0      Ankle    Dorsiflexion (20)    Plantarflexion (50)    Inversion (35)    Eversion (15    (* = pain; Blank rows = not tested)  LE MMT: MMT (out of 5) Right Left  Hip flexion 4- 4-  Hip extension    Hip abduction    Hip adduction    Hip internal rotation    Hip external rotation    Knee flexion 4- 4  Knee extension 4- 4  Ankle dorsiflexion 4+ 4+  Ankle plantarflexion 5 5  Ankle inversion    Ankle eversion    (* = pain; Blank rows = not tested)  Sensation Grossly intact to  light touch bilateral LEs as determined by testing dermatomes L2-S2. Proprioception, and hot/cold testing deferred on this date.  Reflexes R/L Knee Jerk (L3/4): 2+/2+  Ankle Jerk (S1/2): 2+/2+   Muscle Length Hamstrings: R: Positive for tightness    Palpation Location LEFT  RIGHT           Quadriceps  1  Medial Hamstrings  0  Lateral Hamstrings  0  Lateral Hamstring tendon  0  Medial Hamstring tendon  0  Quadriceps tendon  0  Patella    Patellar Tendon  1  Tibial Tuberosity    Medial joint line  0  Lateral joint line  0  MCL  0  LCL  0  Adductor Tubercle    Pes Anserine tendon    Infrapatellar fat pad    Fibular head    Popliteal fossa    (Blank rows = not tested) Graded on 0-4 scale (0 = no pain, 1 = pain, 2 = pain with wincing/grimacing/flinching, 3 = pain with withdrawal, 4 = unwilling to allow palpation), (Blank rows = not tested)  Passive Accessory Motion Deferred  VASCULAR Deferred    SPECIAL TESTS  Ligamentous Stability  ACL: Lachman's: R: Negative  Anterior Drawer: R: Negative   MCL: Valgus Stress (30 degrees flexion): R: Negative  LCL: Varus Stress (30 degrees flexion): R: Negative   Functional Tests:  : .75 m/s 5TSTS: 14.07s  TODAY'S TREATMENT: DATE: 04/05/24   Subjective: Patient reports that her knee popped in the AM. She reports that her current pain 5/10 NPS. No further questions or concerns.    Therapeutic Exercises:   Omega Cable Machine    Knee Extension    1 x 10 - 10#    - moderate pain with eccentric control at patellar tendon,    Hamstring Curl    2 x 10 - 20#    1 x 10 - 25#    Seated Isometric knee Extension   L:3 sets x 10s - against PT resistance at ~40 deg flex  Therapeutic Activity:   NuStep L5-2 x 6 min x LE only  (Seat 9) for LE endurance and strength; PT manually adjusted resistance throughout bout.   TRX Squat  3 x 10   - Manual Cues for stance   Forward Step Up - 6  R/LLE: 2 x 10 - BUE  Support   Lateral Walking against resistance   4 x 12' - Blue TB   Dumbbell Squats  1 x 8 - 10# DB     PATIENT EDUCATION:  Education details: Exercise Technique  Person educated: Patient Education method: Explanation, Demonstration, and Handouts Education comprehension: verbalized understanding and returned demonstration   HOME EXERCISE PROGRAM:  Access Code: [email or web address] URL: [email or web address] Date: 04/05/2024 Prepared by: Lonni Pall  Exercises - Recumbent Bike  - 2 x daily - 7 x weekly - 1 sets - 10 min hold - Seated Piriformis Stretch  - 2 x daily - 7 x weekly - 3 sets - 15- 30s hold - Seated Hamstring Stretch  - 2 x daily - 7 x weekly - 3 sets - 15-30s hold - Seated Knee Flexion Stretch  - 1 x daily - 7 x weekly - 3 sets - 30s hold - Mini Squat with Counter Support  - 2 x daily - 3-4 x weekly - 2 sets - 10-12 reps - Seated Long Arc Quad  - 1 x daily - 3-4 x weekly - 2-3 sets - 10-15 reps - Forward Step Up  - 1 x daily - 3-4 x weekly - 2-3 sets - 10 reps - Side Stepping with Resistance at Thighs  - 1 x daily - 3-4 x weekly - 2-3 sets - 10-12 reps - Standing 3-Way Leg Reach with Resistance at Ankles and Unilateral Counter Support  - 1 x daily - 3-4 x weekly - 2-3 sets - 10 reps - Supine Heel Slide with Strap  - 2 x daily - 7 x weekly - 3 sets - 30s hold  Access Code: [email or web address] URL: [email or web address] Date: 03/29/2024 Prepared by: Lonni Pall  Exercises - Recumbent Bike  - 2 x daily - 7 x weekly - 1 sets - 10 min hold - Seated Piriformis Stretch  - 2 x daily - 7 x weekly - 3 sets - 15- 30s hold - Seated Hamstring Stretch  - 2 x daily - 7 x weekly - 3 sets - 15-30s hold - Seated Knee Flexion Stretch  - 1 x daily - 7 x weekly - 3 sets - 30s hold - Mini Squat with Counter Support  - 2 x daily - 3-4 x weekly - 2 sets - 10-12 reps - Seated Long Arc Quad  - 1 x daily - 3-4 x weekly - 2-3 sets - 10-15 reps - Forward Step Up  - 1 x daily -  3-4 x weekly - 2-3 sets - 10 reps - Supine Heel Slide with Strap  - 2 x daily - 7 x weekly - 3 sets - 30s hold  ASSESSMENT:  CLINICAL IMPRESSION: Patient reports to OPPT for continued management of R knee pain.  Some pain noted with eccentric control with isolated quad exercises. PT session focused on knee and hip strengthening. Some cues needed for proper stance with squat form. Isometric strengthening applied to LLE in order to reduce patellar tendon pain and isolate quadriceps strength. Patient still presenting with R knee pain preventing her from prolonged standing, walking, and participating in community activities.  Based on today's performance, pt will continue to benefit from skilled PT in order to facilitate return to PLOF and improve QoL.  OBJECTIVE IMPAIRMENTS: Abnormal gait, cardiopulmonary status limiting activity, decreased endurance, difficulty walking, decreased ROM, decreased strength, and  pain.   ACTIVITY LIMITATIONS: sitting, standing, squatting, stairs, and transfers  PARTICIPATION LIMITATIONS: community activity and yard work  PERSONAL FACTORS: Age, Past/current experiences, and Time since onset of injury/illness/exacerbation are also affecting patient's functional outcome.   REHAB POTENTIAL: Good  CLINICAL DECISION MAKING: Evolving/moderate complexity  EVALUATION COMPLEXITY: Moderate   GOALS: Goals reviewed with patient? Yes  SHORT TERM GOALS: Target date: 05/03/2024  Pt will be independent with HEP to improve strength and decrease knee pain to improve pain-free function at home and work. Baseline: 03/15/2024: Initial HEP provided Goal status: INITIAL   LONG TERM GOALS: Target date: 05/31/2024  Pt will decrease 5TSTS by at least 3 seconds in order to demonstrate clinically significant improvement in LE strength  Baseline: 03/15/2024: 14.07s Goal status: INITIAL  2.  Pt will decrease worst knee pain by at least 3 points on the NPRS in order to demonstrate  clinically significant reduction in knee pain. Baseline: 03/15/2024: 8/10 NPS Goal status: INITIAL  3.  Pt will increase LEFS score by at least 9 points in order demonstrate clinically significant reduction in knee pain/disability.       Baseline: 03/15/2024: 28/80 (80/80 = max disability)  Goal status: INITIAL  4.  Pt will increase strength of Knee flexion/ext to a 4+ grade bilaterally in order to demonstrate improvement in strength and function  Baseline: 03/15/2024:   R L  Knee flexion 4- 4  Knee extension 4- 4   Goal status: INITIAL   PLAN: PT FREQUENCY: 1-2x/week  PT DURATION: 8 weeks  PLANNED INTERVENTIONS: Therapeutic exercises, Therapeutic activity, Neuromuscular re-education, Balance training, Gait training, Patient/Family education, Self Care, Joint mobilization, Joint manipulation, Vestibular training, Canalith repositioning, Orthotic/Fit training, DME instructions, Dry Needling, Electrical stimulation, Spinal manipulation, Spinal mobilization, Cryotherapy, Moist heat, Taping, Traction, Ultrasound, Ionotophoresis 4mg /ml Dexamethasone, Manual therapy, and Re-evaluation.  PLAN FOR NEXT SESSION: Review HEP, Progress Knee Strengthening, Hip Strengthening, Knee Flexion stretches, hamstring stretches    Lonni Pall PT, DPT Physical Therapist- [facility]  04/05/2024, 8:13 AM

## 2024-04-07 ENCOUNTER — Ambulatory Visit

## 2024-04-07 DIAGNOSIS — M6281 Muscle weakness (generalized): Secondary | ICD-10-CM

## 2024-04-07 DIAGNOSIS — G8929 Other chronic pain: Secondary | ICD-10-CM

## 2024-04-07 DIAGNOSIS — M25561 Pain in right knee: Secondary | ICD-10-CM | POA: Diagnosis not present

## 2024-04-07 DIAGNOSIS — M25661 Stiffness of right knee, not elsewhere classified: Secondary | ICD-10-CM

## 2024-04-07 NOTE — Therapy (Signed)
 OUTPATIENT PHYSICAL THERAPY KNEE TREATMENT  Patient Name: Mercedes White MRN: [id number] DOB:[birth year], 68 y.o., female Today's Date: 04/07/2024  END OF SESSION:  PT End of Session - 04/07/24 0948     Visit Number 7    Number of Visits 17    Date for Recertification  05/10/24    PT Start Time 0945    PT Stop Time 1025    PT Time Calculation (min) 40 min    Activity Tolerance Patient tolerated treatment well    Behavior During Therapy [facility] for tasks assessed/performed          Past Medical History:  Diagnosis Date   Allergy    Arthritis    hands    Diverticulosis    Hx of adenomatous polyp of colon 02/17/2019   Migraines    with menses   Past Surgical History:  Procedure Laterality Date   CESAREAN SECTION     x1   COLONOSCOPY  2010   WISDOM TOOTH EXTRACTION     Patient Active Problem List   Diagnosis Date Noted   Establishing care with new doctor, encounter for 03/05/2024   Fall 03/05/2024   Acute pain of right knee 03/05/2024   Hyperlipidemia 03/05/2024   Finger pain, left 11/19/2022   Hx of adenomatous polyp of colon 02/17/2019   Prediabetes 12/02/2017   Osteoarthrosis, generalized, involving multiple sites 11/14/2015   Obesity (BMI 30-39.9) 11/01/2013   Routine general medical examination at a health care facility 11/01/2010   MENOPAUSAL SYNDROME 07/16/2010   Chronic seasonal allergic rhinitis due to pollen 01/14/2008   Premenstrual tension syndrome 01/13/2008    PCP: Vincente Shivers, NP  REFERRING PROVIDER: Vincente Shivers, NP  REFERRING DIAG:  [phone] (ICD-10-CM) - Acute pain of right knee      RATIONALE FOR EVALUATION AND TREATMENT: Rehabilitation  THERAPY DIAG: Chronic pain of right knee  Muscle weakness (generalized)  Stiffness of right knee, not elsewhere classified  ONSET DATE: October 10th 2025  FOLLOW-UP APPT SCHEDULED WITH REFERRING PROVIDER: No    SUBJECTIVE:                                                                                                                                                                                          SUBJECTIVE STATEMENT:    Patient reports to OPPT with a chief concern of L knee pain.   PERTINENT HISTORY:   Patient reports a fall on Oct 10th; she reports that she tripped on the last step (going up), her shoes caught the last step and she fell and sprained her ankle. Patient reports that she has used tylenol and voltaren with  minimal relief. She reports that's a reclined position relieves the pain however as soon as she transitions from reclined to seated the pain returns. Patient uses a recumbent elliptical in order to transition from the recliner. Patient reports that the pain prevents her from prolonged standing, sitting and she has to navigate stairs one step at a time.   Denies b/b, Saddle parasthesia, chills/fever, night sweats, nausea, vomiting,   Imaging:   EXAM: 4 VIEW(S) XRAY OF THE RIGHT KNEE 03/05/2024 09:58:31 AM   COMPARISON: None available.   CLINICAL HISTORY: acute right knee pain x 4 weeks after fall.   FINDINGS:   BONES AND JOINTS: No acute fracture. No focal osseous lesion. No joint dislocation. There is a small joint effusion. There is lateral patellofemoral compartment joint space narrowing compatible with moderate degenerative change. There are also mild degenerative changes at the lateral compartment of the knee with joint space narrowing and osteophyte formation.   SOFT TISSUES: The soft tissues are unremarkable.   IMPRESSION: 1. Small joint effusion. 2. No acute findings. 3. Moderate degenerative change of the knee.   Electronically signed by: Greig Pique MD 03/09/2024 11:31 PM EST RP Workstation: [id number]  PAIN:   Pain Intensity: Present: 0/10, Best: /10, Worst: 8/10 Pain location: R knee  Pain quality: intermittent and sharp  Radiating pain: Yes  Swelling: Yes  Numbness/Tingling: No Focal weakness or buckling:  Yes History of prior back, hip, or knee injury, pain, surgery, or therapy: No  PRECAUTIONS: Fall  WEIGHT BEARING RESTRICTIONS: No  FALLS: Has patient fallen in last 6 months? Yes. Number of falls 1  Living Environment Lives with: lives with their spouse Lives in: House/apartment Stairs: Internal 15 Step; Rail Support: Both   Prior level of function: Independent  Occupational demands: Retired   Hobbies: Taking Care of her dog  PATIENT GOALS: Patient would like to be able to walk without wobbling and get my knee stronger.    OBJECTIVE:   Patient Surveys  LEFS  Extreme difficulty/unable (0), Quite a bit of difficulty (1), Moderate difficulty (2), Little difficulty (3), No difficulty (4) Survey date:  03/15/2024  Any of your usual work, housework or school activities 3  2. Usual hobbies, recreational or sporting activities 2  3. Getting into/out of the bath 0  4. Walking between rooms 4  5. Putting on socks/shoes 3  6. Squatting  1  7. Lifting an object, like a bag of groceries from the floor 3  8. Performing light activities around your home 3  9. Performing heavy activities around your home 1  10. Getting into/out of a car 2  11. Walking 2 blocks 1  12. Walking 1 mile 0  13. Going up/down 10 stairs (1 flight) 3  14. Standing for 1 hour 2  15.  sitting for 1 hour 2  16. Running on even ground 0  17. Running on uneven ground 0  18. Making sharp turns while running fast 0  19. Hopping  0  20. Rolling over in bed 2  Score total:  28/80     Cognition WNL     Gross Musculoskeletal Assessment Tremor: None Bulk: Normal Tone: Normal  GAIT: Distance walked: 10 m Assistive device utilized: None Level of assistance: Complete Independence Comments: Decreased weight bearing on R, reciprocal gait pattern   Posture:  AROM AROM (Normal range in degrees) AROM  Hip Right Left  Flexion (125)    Extension (15)    Abduction (40)    Adduction  Internal Rotation  (45)    External Rotation (45)        Knee    Flexion (135) 100 130  Extension (0) 0 0      Ankle    Dorsiflexion (20)    Plantarflexion (50)    Inversion (35)    Eversion (15    (* = pain; Blank rows = not tested)  LE MMT: MMT (out of 5) Right Left  Hip flexion 4- 4-  Hip extension    Hip abduction    Hip adduction    Hip internal rotation    Hip external rotation    Knee flexion 4- 4  Knee extension 4- 4  Ankle dorsiflexion 4+ 4+  Ankle plantarflexion 5 5  Ankle inversion    Ankle eversion    (* = pain; Blank rows = not tested)  Sensation Grossly intact to light touch bilateral LEs as determined by testing dermatomes L2-S2. Proprioception, and hot/cold testing deferred on this date.  Reflexes R/L Knee Jerk (L3/4): 2+/2+  Ankle Jerk (S1/2): 2+/2+   Muscle Length Hamstrings: R: Positive for tightness    Palpation Location LEFT  RIGHT           Quadriceps  1  Medial Hamstrings  0  Lateral Hamstrings  0  Lateral Hamstring tendon  0  Medial Hamstring tendon  0  Quadriceps tendon  0  Patella    Patellar Tendon  1  Tibial Tuberosity    Medial joint line  0  Lateral joint line  0  MCL  0  LCL  0  Adductor Tubercle    Pes Anserine tendon    Infrapatellar fat pad    Fibular head    Popliteal fossa    (Blank rows = not tested) Graded on 0-4 scale (0 = no pain, 1 = pain, 2 = pain with wincing/grimacing/flinching, 3 = pain with withdrawal, 4 = unwilling to allow palpation), (Blank rows = not tested)  Passive Accessory Motion Deferred  VASCULAR Deferred    SPECIAL TESTS  Ligamentous Stability  ACL: Lachman's: R: Negative  Anterior Drawer: R: Negative   MCL: Valgus Stress (30 degrees flexion): R: Negative  LCL: Varus Stress (30 degrees flexion): R: Negative   Functional Tests:  : .75 m/s 5TSTS: 14.07s  TODAY'S TREATMENT: DATE: 04/07/24  Subjective: Patient reports pain in her moderate pain in her knee following PT session. Pain  rated around 7/10 last night. Currently 6/10 prior to start of session.   Therapeutic Exercises:  Knee Extension   1 x 10 - 10#  2 x 10 x 5s hold - SAQ 0 - 40deg   Knee Flexion   3 x 10 - 25#   Supine Knee Flexion Stretch - Strap   30s/bout x 3 in order to improve ROM and tissue extensibility  Therapeutic Activity:  Matrix Exercise Bike Level 4-2 x 6 min (Seat 11) for LE strength and endurance for walking capacity; PT manually adjusted resistance throughout per patient tolerance  Dumbbell Squat   2 x 10 - 10# - mod pain in the R/L knee   Resisted Sit to Stand - 10# DB   3 x 10   Wall Squat with Foam Roller   1 x 8   1 x 10   PATIENT EDUCATION:  Education details: Exercise Technique Person educated: Patient Education method: Explanation, Demonstration, and Handouts Education comprehension: verbalized understanding and returned demonstration   HOME EXERCISE PROGRAM:  Access Code: [email or web address] URL: [email or web address] Date:  03/17/2024 Prepared by: Lonni Pall  Exercises - Recumbent Bike  - 1 x daily - 3 x weekly - 1 sets - 10 min hold - Seated Piriformis Stretch  - 2 x daily - 7 x weekly - 3 sets - 15- 30s hold - Seated Hamstring Stretch  - 2 x daily - 7 x weekly - 3 sets - 15-30s hold - Seated Knee Flexion Stretch  - 1 x daily - 7 x weekly - 3 sets - 30s hold - Mini Squat with Counter Support  - 2 x daily - 3-4 x weekly - 2 sets - 10-12 reps - Supine Heel Slide with Strap  - 2 x daily - 7 x weekly - 3 sets - 30s hold - Seated Long Arc Quad  - 1 x daily - 3-4 x weekly - 2-3 sets - 10-15 reps  Access Code: [email or web address] URL: [email or web address] Date: 03/15/2024 Prepared by: Lonni Pall  Exercises - Seated Piriformis Stretch  - 2 x daily - 7 x weekly - 3 sets - 15- 30s hold - Seated Piriformis Stretch  - 2 x daily - 7 x weekly - 2-3 sets - 30s hold - Seated Hamstring Stretch  - 2 x daily - 7 x weekly - 3 sets - 15-30s hold - Mini  Squat with Counter Support  - 2 x daily - 3-4 x weekly - 2 sets - 10-12 reps  ASSESSMENT:  CLINICAL IMPRESSION: Continued PT POC in management of R knee pain. Patient tolerated all progressions to PT interventions without additional pain in the R knee. Patient tolerated increased SLS stance on the R knee during ankle 3 way.  Good carryover with knee flexion stretch with ankle strap in supine position. She also demonstrated good squat technique with minimal use of UE.  Patient's R knee flexion still significantly less than L knee secondary to pain. PT plans to continue knee/hip strengthening in order to provide improved muscular support to R knee. Based on today's performance patient will benefit from skilled PT in order to address current deficits and improve Quality of life.   OBJECTIVE IMPAIRMENTS: Abnormal gait, cardiopulmonary status limiting activity, decreased endurance, difficulty walking, decreased ROM, decreased strength, and pain.   ACTIVITY LIMITATIONS: sitting, standing, squatting, stairs, and transfers  PARTICIPATION LIMITATIONS: community activity and yard work  PERSONAL FACTORS: Age, Past/current experiences, and Time since onset of injury/illness/exacerbation are also affecting patient's functional outcome.   REHAB POTENTIAL: Good  CLINICAL DECISION MAKING: Evolving/moderate complexity  EVALUATION COMPLEXITY: Moderate   GOALS: Goals reviewed with patient? Yes  SHORT TERM GOALS: Target date: 05/05/2024  Pt will be independent with HEP to improve strength and decrease knee pain to improve pain-free function at home and work. Baseline: 03/15/2024: Initial HEP provided Goal status: INITIAL   LONG TERM GOALS: Target date: 06/02/2024  Pt will decrease 5TSTS by at least 3 seconds in order to demonstrate clinically significant improvement in LE strength  Baseline: 03/15/2024: 14.07s Goal status: INITIAL  2.  Pt will decrease worst knee pain by at least 3 points on the  NPRS in order to demonstrate clinically significant reduction in knee pain. Baseline: 03/15/2024: 8/10 NPS Goal status: INITIAL  3.  Pt will increase LEFS score by at least 9 points in order demonstrate clinically significant reduction in knee pain/disability.       Baseline: 03/15/2024: 28/80 (80/80 = max disability)  Goal status: INITIAL  4.  Pt will increase strength of Knee flexion/ext to a 4+ grade bilaterally in order to  demonstrate improvement in strength and function  Baseline: 03/15/2024:   R L  Knee flexion 4- 4  Knee extension 4- 4   Goal status: INITIAL   PLAN: PT FREQUENCY: 1-2x/week  PT DURATION: 8 weeks  PLANNED INTERVENTIONS: Therapeutic exercises, Therapeutic activity, Neuromuscular re-education, Balance training, Gait training, Patient/Family education, Self Care, Joint mobilization, Joint manipulation, Vestibular training, Canalith repositioning, Orthotic/Fit training, DME instructions, Dry Needling, Electrical stimulation, Spinal manipulation, Spinal mobilization, Cryotherapy, Moist heat, Taping, Traction, Ultrasound, Ionotophoresis 4mg /ml Dexamethasone, Manual therapy, and Re-evaluation.  PLAN FOR NEXT SESSION: Review HEP, Initiate Knee Strengthening, Hip Strengthening, Knee Flexion stretches, hamstring stretches    Lonni Pall PT, DPT Physical Therapist- [facility]  04/07/2024, 9:51 AM  MRN: [id number] DOB:1955-09-07, 68 y.o., female Today's Date: 04/07/2024  END OF SESSION:  PT End of Session - 04/07/24 0948     Visit Number 7    Number of Visits 17    Date for Recertification  05/10/24    PT Start Time 0945    PT Stop Time 1025    PT Time Calculation (min) 40 min    Activity Tolerance Patient tolerated treatment well    Behavior During Therapy [facility] for tasks assessed/performed           Past Medical History:  Diagnosis Date   Allergy    Arthritis    hands    Diverticulosis    Hx of adenomatous polyp of colon 02/17/2019   Migraines     with menses   Past Surgical History:  Procedure Laterality Date   CESAREAN SECTION     x1   COLONOSCOPY  2010   WISDOM TOOTH EXTRACTION     Patient Active Problem List   Diagnosis Date Noted   Establishing care with new doctor, encounter for 03/05/2024   Fall 03/05/2024   Acute pain of right knee 03/05/2024   Hyperlipidemia 03/05/2024   Finger pain, left 11/19/2022   Hx of adenomatous polyp of colon 02/17/2019   Prediabetes 12/02/2017   Osteoarthrosis, generalized, involving multiple sites 11/14/2015   Obesity (BMI 30-39.9) 11/01/2013   Routine general medical examination at a health care facility 11/01/2010   MENOPAUSAL SYNDROME 07/16/2010   Chronic seasonal allergic rhinitis due to pollen 01/14/2008   Premenstrual tension syndrome 01/13/2008    PCP: Vincente Shivers, NP  REFERRING PROVIDER: Vincente Shivers, NP  REFERRING DIAG:  [phone] (ICD-10-CM) - Acute pain of right knee      RATIONALE FOR EVALUATION AND TREATMENT: Rehabilitation  THERAPY DIAG: Chronic pain of right knee  Muscle weakness (generalized)  Stiffness of right knee, not elsewhere classified  ONSET DATE: October 10th 2025  FOLLOW-UP APPT SCHEDULED WITH REFERRING PROVIDER: No    SUBJECTIVE:  SUBJECTIVE STATEMENT:    Patient reports to OPPT with a chief concern of L knee pain.   PERTINENT HISTORY:   Patient reports a fall on Oct 10th; she reports that she tripped on the last step (going up), her shoes caught the last step and she fell and sprained her ankle. Patient reports that she has used tylenol and voltaren with minimal relief. She reports that's a reclined position relieves the pain however as soon as she transitions from reclined to seated the pain returns. Patient uses a recumbent elliptical in order to transition  from the recliner. Patient reports that the pain prevents her from prolonged standing, sitting and she has to navigate stairs one step at a time.   Denies b/b, Saddle parasthesia, chills/fever, night sweats, nausea, vomiting,   Imaging:   EXAM: 4 VIEW(S) XRAY OF THE RIGHT KNEE 03/05/2024 09:58:31 AM   COMPARISON: None available.   CLINICAL HISTORY: acute right knee pain x 4 weeks after fall.   FINDINGS:   BONES AND JOINTS: No acute fracture. No focal osseous lesion. No joint dislocation. There is a small joint effusion. There is lateral patellofemoral compartment joint space narrowing compatible with moderate degenerative change. There are also mild degenerative changes at the lateral compartment of the knee with joint space narrowing and osteophyte formation.   SOFT TISSUES: The soft tissues are unremarkable.   IMPRESSION: 1. Small joint effusion. 2. No acute findings. 3. Moderate degenerative change of the knee.   Electronically signed by: Greig Pique MD 03/09/2024 11:31 PM EST RP Workstation: [id number]  PAIN:   Pain Intensity: Present: 0/10, Best: /10, Worst: 8/10 Pain location: R knee  Pain quality: intermittent and sharp  Radiating pain: Yes  Swelling: Yes  Numbness/Tingling: No Focal weakness or buckling: Yes History of prior back, hip, or knee injury, pain, surgery, or therapy: No  PRECAUTIONS: Fall  WEIGHT BEARING RESTRICTIONS: No  FALLS: Has patient fallen in last 6 months? Yes. Number of falls 1  Living Environment Lives with: lives with their spouse Lives in: House/apartment Stairs: Internal 15 Step; Rail Support: Both   Prior level of function: Independent  Occupational demands: Retired   Hobbies: Taking Care of her dog  PATIENT GOALS: Patient would like to be able to walk without wobbling and get my knee stronger.    OBJECTIVE:   Patient Surveys  LEFS  Extreme difficulty/unable (0), Quite a bit of difficulty (1), Moderate  difficulty (2), Little difficulty (3), No difficulty (4) Survey date:  03/15/2024  Any of your usual work, housework or school activities 3  2. Usual hobbies, recreational or sporting activities 2  3. Getting into/out of the bath 0  4. Walking between rooms 4  5. Putting on socks/shoes 3  6. Squatting  1  7. Lifting an object, like a bag of groceries from the floor 3  8. Performing light activities around your home 3  9. Performing heavy activities around your home 1  10. Getting into/out of a car 2  11. Walking 2 blocks 1  12. Walking 1 mile 0  13. Going up/down 10 stairs (1 flight) 3  14. Standing for 1 hour 2  15.  sitting for 1 hour 2  16. Running on even ground 0  17. Running on uneven ground 0  18. Making sharp turns while running fast 0  19. Hopping  0  20. Rolling over in bed 2  Score total:  28/80     Cognition WNL  Gross Musculoskeletal Assessment Tremor: None Bulk: Normal Tone: Normal  GAIT: Distance walked: 10 m Assistive device utilized: None Level of assistance: Complete Independence Comments: Decreased weight bearing on R, reciprocal gait pattern   Posture:  AROM AROM (Normal range in degrees) AROM  Hip Right Left  Flexion (125)    Extension (15)    Abduction (40)    Adduction     Internal Rotation (45)    External Rotation (45)        Knee    Flexion (135) 100 130  Extension (0) 0 0      Ankle    Dorsiflexion (20)    Plantarflexion (50)    Inversion (35)    Eversion (15    (* = pain; Blank rows = not tested)  LE MMT: MMT (out of 5) Right Left  Hip flexion 4- 4-  Hip extension    Hip abduction    Hip adduction    Hip internal rotation    Hip external rotation    Knee flexion 4- 4  Knee extension 4- 4  Ankle dorsiflexion 4+ 4+  Ankle plantarflexion 5 5  Ankle inversion    Ankle eversion    (* = pain; Blank rows = not tested)  Sensation Grossly intact to light touch bilateral LEs as determined by testing dermatomes  L2-S2. Proprioception, and hot/cold testing deferred on this date.  Reflexes R/L Knee Jerk (L3/4): 2+/2+  Ankle Jerk (S1/2): 2+/2+   Muscle Length Hamstrings: R: Positive for tightness    Palpation Location LEFT  RIGHT           Quadriceps  1  Medial Hamstrings  0  Lateral Hamstrings  0  Lateral Hamstring tendon  0  Medial Hamstring tendon  0  Quadriceps tendon  0  Patella    Patellar Tendon  1  Tibial Tuberosity    Medial joint line  0  Lateral joint line  0  MCL  0  LCL  0  Adductor Tubercle    Pes Anserine tendon    Infrapatellar fat pad    Fibular head    Popliteal fossa    (Blank rows = not tested) Graded on 0-4 scale (0 = no pain, 1 = pain, 2 = pain with wincing/grimacing/flinching, 3 = pain with withdrawal, 4 = unwilling to allow palpation), (Blank rows = not tested)  Passive Accessory Motion Deferred  VASCULAR Deferred    SPECIAL TESTS  Ligamentous Stability  ACL: Lachman's: R: Negative  Anterior Drawer: R: Negative   MCL: Valgus Stress (30 degrees flexion): R: Negative  LCL: Varus Stress (30 degrees flexion): R: Negative   Functional Tests:  : .75 m/s 5TSTS: 14.07s  TODAY'S TREATMENT: DATE: 04/07/24   Subjective: Patient reports increased stiffness in the knee; she contributes it to the lower temperatures. She rates current pain at 4/10 NPS.  No further questions or concerns.    Therapeutic Exercises:   Hip Matrix  Machine    Hip Abduction    R/L: 3 x 10 - 25#      Knee Extension - SAQ 0-40 deg    1 x 10 - 15# - mod pain in the patellar tendon R    2 x 10 x 3s hold     Knee Flexion  3 x 10 - 25#   Knee Flexion Stretch with Strap    30s/bout x 2 in order to improve ROM and tissue extensibility   115 deg of knee flexion measured  Therapeutic Activity:   NuStep L5-2 x 6 min x LE only  (Seat 9) for LE endurance and strength; PT manually adjusted resistance throughout bout.   Dumbbell Squats   2 x 10 - 10# DB   Sit to  Stand - Resisted   2 x 10 - 10# DB  Foam Roller Wall Squats   1 x 8  1 x 10      PATIENT EDUCATION:  Education details: Exercise Technique  Person educated: Patient Education method: Explanation, Demonstration, and Handouts Education comprehension: verbalized understanding and returned demonstration   HOME EXERCISE PROGRAM:  Access Code: [email or web address] URL: [email or web address] Date: 04/05/2024 Prepared by: Lonni Pall  Exercises - Recumbent Bike  - 2 x daily - 7 x weekly - 1 sets - 10 min hold - Seated Piriformis Stretch  - 2 x daily - 7 x weekly - 3 sets - 15- 30s hold - Seated Hamstring Stretch  - 2 x daily - 7 x weekly - 3 sets - 15-30s hold - Seated Knee Flexion Stretch  - 1 x daily - 7 x weekly - 3 sets - 30s hold - Mini Squat with Counter Support  - 2 x daily - 3-4 x weekly - 2 sets - 10-12 reps - Seated Long Arc Quad  - 1 x daily - 3-4 x weekly - 2-3 sets - 10-15 reps - Forward Step Up  - 1 x daily - 3-4 x weekly - 2-3 sets - 10 reps - Side Stepping with Resistance at Thighs  - 1 x daily - 3-4 x weekly - 2-3 sets - 10-12 reps - Standing 3-Way Leg Reach with Resistance at Ankles and Unilateral Counter Support  - 1 x daily - 3-4 x weekly - 2-3 sets - 10 reps - Supine Heel Slide with Strap  - 2 x daily - 7 x weekly - 3 sets - 30s hold  Access Code: [email or web address] URL: [email or web address] Date: 03/29/2024 Prepared by: Lonni Pall  Exercises - Recumbent Bike  - 2 x daily - 7 x weekly - 1 sets - 10 min hold - Seated Piriformis Stretch  - 2 x daily - 7 x weekly - 3 sets - 15- 30s hold - Seated Hamstring Stretch  - 2 x daily - 7 x weekly - 3 sets - 15-30s hold - Seated Knee Flexion Stretch  - 1 x daily - 7 x weekly - 3 sets - 30s hold - Mini Squat with Counter Support  - 2 x daily - 3-4 x weekly - 2 sets - 10-12 reps - Seated Long Arc Quad  - 1 x daily - 3-4 x weekly - 2-3 sets - 10-15 reps - Forward Step Up  - 1 x daily - 3-4 x weekly - 2-3 sets  - 10 reps - Supine Heel Slide with Strap  - 2 x daily - 7 x weekly - 3 sets - 30s hold  ASSESSMENT:  CLINICAL IMPRESSION: Patient reports to OPPT for continued management of R knee pain.  Continued focus on increasing quadricep and hip strength within tolerance. Good ability to perform CKC exercises with squat variations however prolonged repititions limited due to pain in bilateral knees. Knee flexion range of motion significantly limited in R knee compared to L due to pain at end range; slight increase in flexion angle, achieved 115 deg in today's session. Patient still presenting with R knee pain preventing her from prolonged standing, walking, and participating in community activities.  Based on today's performance, pt will  continue to benefit from skilled PT in order to facilitate return to PLOF and improve QoL.  OBJECTIVE IMPAIRMENTS: Abnormal gait, cardiopulmonary status limiting activity, decreased endurance, difficulty walking, decreased ROM, decreased strength, and pain.   ACTIVITY LIMITATIONS: sitting, standing, squatting, stairs, and transfers  PARTICIPATION LIMITATIONS: community activity and yard work  PERSONAL FACTORS: Age, Past/current experiences, and Time since onset of injury/illness/exacerbation are also affecting patient's functional outcome.   REHAB POTENTIAL: Good  CLINICAL DECISION MAKING: Evolving/moderate complexity  EVALUATION COMPLEXITY: Moderate   GOALS: Goals reviewed with patient? Yes  SHORT TERM GOALS: Target date: 05/05/2024  Pt will be independent with HEP to improve strength and decrease knee pain to improve pain-free function at home and work. Baseline: 03/15/2024: Initial HEP provided Goal status: INITIAL   LONG TERM GOALS: Target date: 06/02/2024  Pt will decrease 5TSTS by at least 3 seconds in order to demonstrate clinically significant improvement in LE strength  Baseline: 03/15/2024: 14.07s Goal status: INITIAL  2.  Pt will decrease  worst knee pain by at least 3 points on the NPRS in order to demonstrate clinically significant reduction in knee pain. Baseline: 03/15/2024: 8/10 NPS Goal status: INITIAL  3.  Pt will increase LEFS score by at least 9 points in order demonstrate clinically significant reduction in knee pain/disability.       Baseline: 03/15/2024: 28/80 (80/80 = max disability)  Goal status: INITIAL  4.  Pt will increase strength of Knee flexion/ext to a 4+ grade bilaterally in order to demonstrate improvement in strength and function  Baseline: 03/15/2024:   R L  Knee flexion 4- 4  Knee extension 4- 4   Goal status: INITIAL   PLAN: PT FREQUENCY: 1-2x/week  PT DURATION: 8 weeks  PLANNED INTERVENTIONS: Therapeutic exercises, Therapeutic activity, Neuromuscular re-education, Balance training, Gait training, Patient/Family education, Self Care, Joint mobilization, Joint manipulation, Vestibular training, Canalith repositioning, Orthotic/Fit training, DME instructions, Dry Needling, Electrical stimulation, Spinal manipulation, Spinal mobilization, Cryotherapy, Moist heat, Taping, Traction, Ultrasound, Ionotophoresis 4mg /ml Dexamethasone, Manual therapy, and Re-evaluation.  PLAN FOR NEXT SESSION: Review HEP, Progress Knee Strengthening, Hip Strengthening, Knee Flexion stretches, hamstring stretches    Lonni Pall PT, DPT Physical Therapist- [place]  04/07/2024, 9:51 AM

## 2024-04-12 ENCOUNTER — Ambulatory Visit

## 2024-04-12 DIAGNOSIS — M6281 Muscle weakness (generalized): Secondary | ICD-10-CM

## 2024-04-12 DIAGNOSIS — M25561 Pain in right knee: Secondary | ICD-10-CM | POA: Diagnosis not present

## 2024-04-12 DIAGNOSIS — G8929 Other chronic pain: Secondary | ICD-10-CM

## 2024-04-12 DIAGNOSIS — M25661 Stiffness of right knee, not elsewhere classified: Secondary | ICD-10-CM

## 2024-04-12 NOTE — Therapy (Signed)
 OUTPATIENT PHYSICAL THERAPY KNEE TREATMENT  Patient Name: Mercedes White MRN: [id number] DOB:[birth year], 68 y.o., female Today's Date: 04/12/2024  END OF SESSION:  PT End of Session - 04/12/24 1117     Visit Number 8    Number of Visits 17    Date for Recertification  05/10/24    PT Start Time 1116    PT Stop Time 1157    PT Time Calculation (min) 41 min    Activity Tolerance Patient tolerated treatment well    Behavior During Therapy [facility] for tasks assessed/performed          Past Medical History:  Diagnosis Date   Allergy    Arthritis    hands    Diverticulosis    Hx of adenomatous polyp of colon 02/17/2019   Migraines    with menses   Past Surgical History:  Procedure Laterality Date   CESAREAN SECTION     x1   COLONOSCOPY  2010   WISDOM TOOTH EXTRACTION     Patient Active Problem List   Diagnosis Date Noted   Establishing care with new doctor, encounter for 03/05/2024   Fall 03/05/2024   Acute pain of right knee 03/05/2024   Hyperlipidemia 03/05/2024   Finger pain, left 11/19/2022   Hx of adenomatous polyp of colon 02/17/2019   Prediabetes 12/02/2017   Osteoarthrosis, generalized, involving multiple sites 11/14/2015   Obesity (BMI 30-39.9) 11/01/2013   Routine general medical examination at a health care facility 11/01/2010   MENOPAUSAL SYNDROME 07/16/2010   Chronic seasonal allergic rhinitis due to pollen 01/14/2008   Premenstrual tension syndrome 01/13/2008    PCP: Vincente Shivers, NP  REFERRING PROVIDER: Vincente Shivers, NP  REFERRING DIAG:  [phone] (ICD-10-CM) - Acute pain of right knee      RATIONALE FOR EVALUATION AND TREATMENT: Rehabilitation  THERAPY DIAG: Chronic pain of right knee  Muscle weakness (generalized)  Stiffness of right knee, not elsewhere classified  Acute pain of right knee  ONSET DATE: October 10th 2025  FOLLOW-UP APPT SCHEDULED WITH REFERRING PROVIDER: No    SUBJECTIVE:                                                                                                                                                                                          SUBJECTIVE STATEMENT:    Patient reports to OPPT with a chief concern of L knee pain.   PERTINENT HISTORY:   Patient reports a fall on Oct 10th; she reports that she tripped on the last step (going up), her shoes caught the last step and she fell and sprained her ankle. Patient reports that she  has used tylenol and voltaren with minimal relief. She reports that's a reclined position relieves the pain however as soon as she transitions from reclined to seated the pain returns. Patient uses a recumbent elliptical in order to transition from the recliner. Patient reports that the pain prevents her from prolonged standing, sitting and she has to navigate stairs one step at a time.   Denies b/b, Saddle parasthesia, chills/fever, night sweats, nausea, vomiting,   Imaging:   EXAM: 4 VIEW(S) XRAY OF THE RIGHT KNEE 03/05/2024 09:58:31 AM   COMPARISON: None available.   CLINICAL HISTORY: acute right knee pain x 4 weeks after fall.   FINDINGS:   BONES AND JOINTS: No acute fracture. No focal osseous lesion. No joint dislocation. There is a small joint effusion. There is lateral patellofemoral compartment joint space narrowing compatible with moderate degenerative change. There are also mild degenerative changes at the lateral compartment of the knee with joint space narrowing and osteophyte formation.   SOFT TISSUES: The soft tissues are unremarkable.   IMPRESSION: 1. Small joint effusion. 2. No acute findings. 3. Moderate degenerative change of the knee.   Electronically signed by: Greig Pique MD 03/09/2024 11:31 PM EST RP Workstation: [id number]  PAIN:   Pain Intensity: Present: 0/10, Best: /10, Worst: 8/10 Pain location: R knee  Pain quality: intermittent and sharp  Radiating pain: Yes  Swelling: Yes  Numbness/Tingling: No Focal  weakness or buckling: Yes History of prior back, hip, or knee injury, pain, surgery, or therapy: No  PRECAUTIONS: Fall  WEIGHT BEARING RESTRICTIONS: No  FALLS: Has patient fallen in last 6 months? Yes. Number of falls 1  Living Environment Lives with: lives with their spouse Lives in: House/apartment Stairs: Internal 15 Step; Rail Support: Both   Prior level of function: Independent  Occupational demands: Retired   Hobbies: Taking Care of her dog  PATIENT GOALS: Patient would like to be able to walk without wobbling and get my knee stronger.    OBJECTIVE:   Patient Surveys  LEFS  Extreme difficulty/unable (0), Quite a bit of difficulty (1), Moderate difficulty (2), Little difficulty (3), No difficulty (4) Survey date:  03/15/2024  Any of your usual work, housework or school activities 3  2. Usual hobbies, recreational or sporting activities 2  3. Getting into/out of the bath 0  4. Walking between rooms 4  5. Putting on socks/shoes 3  6. Squatting  1  7. Lifting an object, like a bag of groceries from the floor 3  8. Performing light activities around your home 3  9. Performing heavy activities around your home 1  10. Getting into/out of a car 2  11. Walking 2 blocks 1  12. Walking 1 mile 0  13. Going up/down 10 stairs (1 flight) 3  14. Standing for 1 hour 2  15.  sitting for 1 hour 2  16. Running on even ground 0  17. Running on uneven ground 0  18. Making sharp turns while running fast 0  19. Hopping  0  20. Rolling over in bed 2  Score total:  28/80     Cognition WNL     Gross Musculoskeletal Assessment Tremor: None Bulk: Normal Tone: Normal  GAIT: Distance walked: 10 m Assistive device utilized: None Level of assistance: Complete Independence Comments: Decreased weight bearing on R, reciprocal gait pattern   Posture:  AROM AROM (Normal range in degrees) AROM  Hip Right Left  Flexion (125)    Extension (15)  Abduction (40)    Adduction      Internal Rotation (45)    External Rotation (45)        Knee    Flexion (135) 100 130  Extension (0) 0 0      Ankle    Dorsiflexion (20)    Plantarflexion (50)    Inversion (35)    Eversion (15    (* = pain; Blank rows = not tested)  LE MMT: MMT (out of 5) Right Left  Hip flexion 4- 4-  Hip extension    Hip abduction    Hip adduction    Hip internal rotation    Hip external rotation    Knee flexion 4- 4  Knee extension 4- 4  Ankle dorsiflexion 4+ 4+  Ankle plantarflexion 5 5  Ankle inversion    Ankle eversion    (* = pain; Blank rows = not tested)  Sensation Grossly intact to light touch bilateral LEs as determined by testing dermatomes L2-S2. Proprioception, and hot/cold testing deferred on this date.  Reflexes R/L Knee Jerk (L3/4): 2+/2+  Ankle Jerk (S1/2): 2+/2+   Muscle Length Hamstrings: R: Positive for tightness    Palpation Location LEFT  RIGHT           Quadriceps  1  Medial Hamstrings  0  Lateral Hamstrings  0  Lateral Hamstring tendon  0  Medial Hamstring tendon  0  Quadriceps tendon  0  Patella    Patellar Tendon  1  Tibial Tuberosity    Medial joint line  0  Lateral joint line  0  MCL  0  LCL  0  Adductor Tubercle    Pes Anserine tendon    Infrapatellar fat pad    Fibular head    Popliteal fossa    (Blank rows = not tested) Graded on 0-4 scale (0 = no pain, 1 = pain, 2 = pain with wincing/grimacing/flinching, 3 = pain with withdrawal, 4 = unwilling to allow palpation), (Blank rows = not tested)  Passive Accessory Motion Deferred  VASCULAR Deferred    SPECIAL TESTS  Ligamentous Stability  ACL: Lachman's: R: Negative  Anterior Drawer: R: Negative   MCL: Valgus Stress (30 degrees flexion): R: Negative  LCL: Varus Stress (30 degrees flexion): R: Negative   Functional Tests:  : .75 m/s 5TSTS: 14.07s  TODAY'S TREATMENT: DATE: 04/12/24  Subjective: Patient reports pain in her moderate pain in her knee  following PT session. Pain rated around 7/10 last night. Currently 6/10 prior to start of session.   Therapeutic Exercises:  Knee Extension   1 x 10 - 10#  2 x 10 x 5s hold - SAQ 0 - 40deg   Knee Flexion   3 x 10 - 25#   Supine Knee Flexion Stretch - Strap   30s/bout x 3 in order to improve ROM and tissue extensibility  Therapeutic Activity:  Matrix Exercise Bike Level 4-2 x 6 min (Seat 11) for LE strength and endurance for walking capacity; PT manually adjusted resistance throughout per patient tolerance  Dumbbell Squat   2 x 10 - 10# - mod pain in the R/L knee   Resisted Sit to Stand - 10# DB   3 x 10   Wall Squat with Foam Roller   1 x 8   1 x 10   PATIENT EDUCATION:  Education details: Exercise Technique Person educated: Patient Education method: Explanation, Demonstration, and Handouts Education comprehension: verbalized understanding and returned demonstration  HOME EXERCISE PROGRAM:  Access Code: [email or web address] URL: [email or web address] Date: 03/17/2024 Prepared by: Lonni Pall  Exercises - Recumbent Bike  - 1 x daily - 3 x weekly - 1 sets - 10 min hold - Seated Piriformis Stretch  - 2 x daily - 7 x weekly - 3 sets - 15- 30s hold - Seated Hamstring Stretch  - 2 x daily - 7 x weekly - 3 sets - 15-30s hold - Seated Knee Flexion Stretch  - 1 x daily - 7 x weekly - 3 sets - 30s hold - Mini Squat with Counter Support  - 2 x daily - 3-4 x weekly - 2 sets - 10-12 reps - Supine Heel Slide with Strap  - 2 x daily - 7 x weekly - 3 sets - 30s hold - Seated Long Arc Quad  - 1 x daily - 3-4 x weekly - 2-3 sets - 10-15 reps  Access Code: [email or web address] URL: [email or web address] Date: 03/15/2024 Prepared by: Lonni Pall  Exercises - Seated Piriformis Stretch  - 2 x daily - 7 x weekly - 3 sets - 15- 30s hold - Seated Piriformis Stretch  - 2 x daily - 7 x weekly - 2-3 sets - 30s hold - Seated Hamstring Stretch  - 2 x daily - 7 x weekly - 3 sets  - 15-30s hold - Mini Squat with Counter Support  - 2 x daily - 3-4 x weekly - 2 sets - 10-12 reps  ASSESSMENT:  CLINICAL IMPRESSION: Continued PT POC in management of R knee pain. Patient tolerated all progressions to PT interventions without additional pain in the R knee. Patient tolerated increased SLS stance on the R knee during ankle 3 way.  Good carryover with knee flexion stretch with ankle strap in supine position. She also demonstrated good squat technique with minimal use of UE.  Patient's R knee flexion still significantly less than L knee secondary to pain. PT plans to continue knee/hip strengthening in order to provide improved muscular support to R knee. Based on today's performance patient will benefit from skilled PT in order to address current deficits and improve Quality of life.   OBJECTIVE IMPAIRMENTS: Abnormal gait, cardiopulmonary status limiting activity, decreased endurance, difficulty walking, decreased ROM, decreased strength, and pain.   ACTIVITY LIMITATIONS: sitting, standing, squatting, stairs, and transfers  PARTICIPATION LIMITATIONS: community activity and yard work  PERSONAL FACTORS: Age, Past/current experiences, and Time since onset of injury/illness/exacerbation are also affecting patient's functional outcome.   REHAB POTENTIAL: Good  CLINICAL DECISION MAKING: Evolving/moderate complexity  EVALUATION COMPLEXITY: Moderate   GOALS: Goals reviewed with patient? Yes  SHORT TERM GOALS: Target date: 05/10/2024  Pt will be independent with HEP to improve strength and decrease knee pain to improve pain-free function at home and work. Baseline: 03/15/2024: Initial HEP provided Goal status: INITIAL   LONG TERM GOALS: Target date: 06/07/2024  Pt will decrease 5TSTS by at least 3 seconds in order to demonstrate clinically significant improvement in LE strength  Baseline: 03/15/2024: 14.07s Goal status: INITIAL  2.  Pt will decrease worst knee pain by at least  3 points on the NPRS in order to demonstrate clinically significant reduction in knee pain. Baseline: 03/15/2024: 8/10 NPS Goal status: INITIAL  3.  Pt will increase LEFS score by at least 9 points in order demonstrate clinically significant reduction in knee pain/disability.       Baseline: 03/15/2024: 28/80 (80/80 = max disability)  Goal status: INITIAL  4.  Pt will increase strength of  Knee flexion/ext to a 4+ grade bilaterally in order to demonstrate improvement in strength and function  Baseline: 03/15/2024:   R L  Knee flexion 4- 4  Knee extension 4- 4   Goal status: INITIAL   PLAN: PT FREQUENCY: 1-2x/week  PT DURATION: 8 weeks  PLANNED INTERVENTIONS: Therapeutic exercises, Therapeutic activity, Neuromuscular re-education, Balance training, Gait training, Patient/Family education, Self Care, Joint mobilization, Joint manipulation, Vestibular training, Canalith repositioning, Orthotic/Fit training, DME instructions, Dry Needling, Electrical stimulation, Spinal manipulation, Spinal mobilization, Cryotherapy, Moist heat, Taping, Traction, Ultrasound, Ionotophoresis 4mg /ml Dexamethasone, Manual therapy, and Re-evaluation.  PLAN FOR NEXT SESSION: Review HEP, Initiate Knee Strengthening, Hip Strengthening, Knee Flexion stretches, hamstring stretches    Lonni Pall PT, DPT Physical Therapist-   04/12/2024, 12:01 PM  MRN: [id number] DOB:[birth year], 68 y.o., female Today's Date: 04/12/2024  END OF SESSION:  PT End of Session - 04/12/24 1117     Visit Number 8    Number of Visits 17    Date for Recertification  05/10/24    PT Start Time 1116    PT Stop Time 1157    PT Time Calculation (min) 41 min    Activity Tolerance Patient tolerated treatment well    Behavior During Therapy WFL for tasks assessed/performed           Past Medical History:  Diagnosis Date   Allergy    Arthritis    hands    Diverticulosis    Hx of adenomatous polyp of colon 02/17/2019    Migraines    with menses   Past Surgical History:  Procedure Laterality Date   CESAREAN SECTION     x1   COLONOSCOPY  2010   WISDOM TOOTH EXTRACTION     Patient Active Problem List   Diagnosis Date Noted   Establishing care with new doctor, encounter for 03/05/2024   Fall 03/05/2024   Acute pain of right knee 03/05/2024   Hyperlipidemia 03/05/2024   Finger pain, left 11/19/2022   Hx of adenomatous polyp of colon 02/17/2019   Prediabetes 12/02/2017   Osteoarthrosis, generalized, involving multiple sites 11/14/2015   Obesity (BMI 30-39.9) 11/01/2013   Routine general medical examination at a health care facility 11/01/2010   MENOPAUSAL SYNDROME 07/16/2010   Chronic seasonal allergic rhinitis due to pollen 01/14/2008   Premenstrual tension syndrome 01/13/2008    PCP: Vincente Shivers, NP  REFERRING PROVIDER: Vincente Shivers, NP  REFERRING DIAG:  [phone] (ICD-10-CM) - Acute pain of right knee      RATIONALE FOR EVALUATION AND TREATMENT: Rehabilitation  THERAPY DIAG: Chronic pain of right knee  Muscle weakness (generalized)  Stiffness of right knee, not elsewhere classified  Acute pain of right knee  ONSET DATE: October 10th 2025  FOLLOW-UP APPT SCHEDULED WITH REFERRING PROVIDER: No    SUBJECTIVE:  SUBJECTIVE STATEMENT:    Patient reports to OPPT with a chief concern of L knee pain.   PERTINENT HISTORY:   Patient reports a fall on Oct 10th; she reports that she tripped on the last step (going up), her shoes caught the last step and she fell and sprained her ankle. Patient reports that she has used tylenol and voltaren with minimal relief. She reports that's a reclined position relieves the pain however as soon as she transitions from reclined to seated the pain returns. Patient uses a  recumbent elliptical in order to transition from the recliner. Patient reports that the pain prevents her from prolonged standing, sitting and she has to navigate stairs one step at a time.   Denies b/b, Saddle parasthesia, chills/fever, night sweats, nausea, vomiting,   Imaging:   EXAM: 4 VIEW(S) XRAY OF THE RIGHT KNEE 03/05/2024 09:58:31 AM   COMPARISON: None available.   CLINICAL HISTORY: acute right knee pain x 4 weeks after fall.   FINDINGS:   BONES AND JOINTS: No acute fracture. No focal osseous lesion. No joint dislocation. There is a small joint effusion. There is lateral patellofemoral compartment joint space narrowing compatible with moderate degenerative change. There are also mild degenerative changes at the lateral compartment of the knee with joint space narrowing and osteophyte formation.   SOFT TISSUES: The soft tissues are unremarkable.   IMPRESSION: 1. Small joint effusion. 2. No acute findings. 3. Moderate degenerative change of the knee.   Electronically signed by: Greig Pique MD 03/09/2024 11:31 PM EST RP Workstation: [id number]  PAIN:   Pain Intensity: Present: 0/10, Best: /10, Worst: 8/10 Pain location: R knee  Pain quality: intermittent and sharp  Radiating pain: Yes  Swelling: Yes  Numbness/Tingling: No Focal weakness or buckling: Yes History of prior back, hip, or knee injury, pain, surgery, or therapy: No  PRECAUTIONS: Fall  WEIGHT BEARING RESTRICTIONS: No  FALLS: Has patient fallen in last 6 months? Yes. Number of falls 1  Living Environment Lives with: lives with their spouse Lives in: House/apartment Stairs: Internal 15 Step; Rail Support: Both   Prior level of function: Independent  Occupational demands: Retired   Hobbies: Taking Care of her dog  PATIENT GOALS: Patient would like to be able to walk without wobbling and get my knee stronger.    OBJECTIVE:   Patient Surveys  LEFS  Extreme difficulty/unable (0),  Quite a bit of difficulty (1), Moderate difficulty (2), Little difficulty (3), No difficulty (4) Survey date:  03/15/2024  Any of your usual work, housework or school activities 3  2. Usual hobbies, recreational or sporting activities 2  3. Getting into/out of the bath 0  4. Walking between rooms 4  5. Putting on socks/shoes 3  6. Squatting  1  7. Lifting an object, like a bag of groceries from the floor 3  8. Performing light activities around your home 3  9. Performing heavy activities around your home 1  10. Getting into/out of a car 2  11. Walking 2 blocks 1  12. Walking 1 mile 0  13. Going up/down 10 stairs (1 flight) 3  14. Standing for 1 hour 2  15.  sitting for 1 hour 2  16. Running on even ground 0  17. Running on uneven ground 0  18. Making sharp turns while running fast 0  19. Hopping  0  20. Rolling over in bed 2  Score total:  28/80     Cognition WNL  Gross Musculoskeletal Assessment Tremor: None Bulk: Normal Tone: Normal  GAIT: Distance walked: 10 m Assistive device utilized: None Level of assistance: Complete Independence Comments: Decreased weight bearing on R, reciprocal gait pattern   Posture:  AROM AROM (Normal range in degrees) AROM  Hip Right Left  Flexion (125)    Extension (15)    Abduction (40)    Adduction     Internal Rotation (45)    External Rotation (45)        Knee    Flexion (135) 100 130  Extension (0) 0 0      Ankle    Dorsiflexion (20)    Plantarflexion (50)    Inversion (35)    Eversion (15    (* = pain; Blank rows = not tested)  LE MMT: MMT (out of 5) Right Left  Hip flexion 4- 4-  Hip extension    Hip abduction    Hip adduction    Hip internal rotation    Hip external rotation    Knee flexion 4- 4  Knee extension 4- 4  Ankle dorsiflexion 4+ 4+  Ankle plantarflexion 5 5  Ankle inversion    Ankle eversion    (* = pain; Blank rows = not tested)  Sensation Grossly intact to light touch bilateral LEs  as determined by testing dermatomes L2-S2. Proprioception, and hot/cold testing deferred on this date.  Reflexes R/L Knee Jerk (L3/4): 2+/2+  Ankle Jerk (S1/2): 2+/2+   Muscle Length Hamstrings: R: Positive for tightness    Palpation Location LEFT  RIGHT           Quadriceps  1  Medial Hamstrings  0  Lateral Hamstrings  0  Lateral Hamstring tendon  0  Medial Hamstring tendon  0  Quadriceps tendon  0  Patella    Patellar Tendon  1  Tibial Tuberosity    Medial joint line  0  Lateral joint line  0  MCL  0  LCL  0  Adductor Tubercle    Pes Anserine tendon    Infrapatellar fat pad    Fibular head    Popliteal fossa    (Blank rows = not tested) Graded on 0-4 scale (0 = no pain, 1 = pain, 2 = pain with wincing/grimacing/flinching, 3 = pain with withdrawal, 4 = unwilling to allow palpation), (Blank rows = not tested)  Passive Accessory Motion Deferred  VASCULAR Deferred    SPECIAL TESTS  Ligamentous Stability  ACL: Lachman's: R: Negative  Anterior Drawer: R: Negative   MCL: Valgus Stress (30 degrees flexion): R: Negative  LCL: Varus Stress (30 degrees flexion): R: Negative   Functional Tests:  : .75 m/s 5TSTS: 14.07s  TODAY'S TREATMENT: DATE: 04/12/24   Subjective: Patient reports to OPPT with minimal pain in medial side of the knee. She reports that her weight watchers therabands are too tight and would like new TB from the clinic.  No further questions or concerns.    Therapeutic Exercises:     Knee Extension - SAQ 0-50 deg    1 x 10 - 15# - improved pain in the front knee   2 x 10 x 5s hold  -15#    Knee Flexion  2 x 10 - 20#   1 x 10 - 25#   Ankle Eversion  2 x 10 - Red TB looped around both feet   Tibialis Raises - Wall Supported  1 x 10 - Pain in the calves  Pt education on changes to HEP, frequency and repetitions   Therapeutic Activity:   NuStep L5-2 x 6 min x LE only  (Seat 9) for LE endurance and strength; PT manually  adjusted resistance throughout bout.   Foam Roller Wall Squats   3 x 10 - Min VC for foot placement    Sit to Stand - Resisted   3 x 10 - 8# DB In BUE   Lateral Stepping Against resistance   4 x 15' - Blue TB   PATIENT EDUCATION:  Education details: Exercise Technique  Person educated: Patient Education method: Explanation, Demonstration, and Handouts Education comprehension: verbalized understanding and returned demonstration   HOME EXERCISE PROGRAM:  Access Code: [email or web address] URL: [email or web address] Date: 04/05/2024 Prepared by: Lonni Pall  Exercises - Recumbent Bike  - 2 x daily - 7 x weekly - 1 sets - 10 min hold - Seated Piriformis Stretch  - 2 x daily - 7 x weekly - 3 sets - 15- 30s hold - Seated Hamstring Stretch  - 2 x daily - 7 x weekly - 3 sets - 15-30s hold - Seated Knee Flexion Stretch  - 1 x daily - 7 x weekly - 3 sets - 30s hold - Mini Squat with Counter Support  - 2 x daily - 3-4 x weekly - 2 sets - 10-12 reps - Seated Long Arc Quad  - 1 x daily - 3-4 x weekly - 2-3 sets - 10-15 reps - Forward Step Up  - 1 x daily - 3-4 x weekly - 2-3 sets - 10 reps - Side Stepping with Resistance at Thighs  - 1 x daily - 3-4 x weekly - 2-3 sets - 10-12 reps - Standing 3-Way Leg Reach with Resistance at Ankles and Unilateral Counter Support  - 1 x daily - 3-4 x weekly - 2-3 sets - 10 reps - Supine Heel Slide with Strap  - 2 x daily - 7 x weekly - 3 sets - 30s hold  Access Code: [email or web address] URL: [email or web address] Date: 03/29/2024 Prepared by: Lonni Pall  Exercises - Recumbent Bike  - 2 x daily - 7 x weekly - 1 sets - 10 min hold - Seated Piriformis Stretch  - 2 x daily - 7 x weekly - 3 sets - 15- 30s hold - Seated Hamstring Stretch  - 2 x daily - 7 x weekly - 3 sets - 15-30s hold - Seated Knee Flexion Stretch  - 1 x daily - 7 x weekly - 3 sets - 30s hold - Mini Squat with Counter Support  - 2 x daily - 3-4 x weekly - 2 sets - 10-12  reps - Seated Long Arc Quad  - 1 x daily - 3-4 x weekly - 2-3 sets - 10-15 reps - Forward Step Up  - 1 x daily - 3-4 x weekly - 2-3 sets - 10 reps - Supine Heel Slide with Strap  - 2 x daily - 7 x weekly - 3 sets - 30s hold  ASSESSMENT:  CLINICAL IMPRESSION: Patient reports to OPPT for continued management of R knee pain. Time spent reviewing new exercise from previous PT session. Good demonstration of squats with foam roller assist and resisted sit to stands with dumbbell. Ankle exercises introduced in order to prevent ankle pain and sensation of weakness in the L ankle. PT plan to continue knee/ankle strengthening in order to improve functional mobility. Patient to purchase walking sticks in order to optimize walking capacity. Patient still presenting with R  knee pain preventing her from prolonged standing, walking, and participating in community activities.  Based on today's performance, pt will continue to benefit from skilled PT in order to facilitate return to PLOF and improve QoL.   OBJECTIVE IMPAIRMENTS: Abnormal gait, cardiopulmonary status limiting activity, decreased endurance, difficulty walking, decreased ROM, decreased strength, and pain.   ACTIVITY LIMITATIONS: sitting, standing, squatting, stairs, and transfers  PARTICIPATION LIMITATIONS: community activity and yard work  PERSONAL FACTORS: Age, Past/current experiences, and Time since onset of injury/illness/exacerbation are also affecting patient's functional outcome.   REHAB POTENTIAL: Good  CLINICAL DECISION MAKING: Evolving/moderate complexity  EVALUATION COMPLEXITY: Moderate   GOALS: Goals reviewed with patient? Yes  SHORT TERM GOALS: Target date: 05/10/2024  Pt will be independent with HEP to improve strength and decrease knee pain to improve pain-free function at home and work. Baseline: 03/15/2024: Initial HEP provided Goal status: INITIAL   LONG TERM GOALS: Target date: 06/07/2024  Pt will decrease 5TSTS by  at least 3 seconds in order to demonstrate clinically significant improvement in LE strength  Baseline: 03/15/2024: 14.07s Goal status: INITIAL  2.  Pt will decrease worst knee pain by at least 3 points on the NPRS in order to demonstrate clinically significant reduction in knee pain. Baseline: 03/15/2024: 8/10 NPS Goal status: INITIAL  3.  Pt will increase LEFS score by at least 9 points in order demonstrate clinically significant reduction in knee pain/disability.       Baseline: 03/15/2024: 28/80 (80/80 = max disability)  Goal status: INITIAL  4.  Pt will increase strength of Knee flexion/ext to a 4+ grade bilaterally in order to demonstrate improvement in strength and function  Baseline: 03/15/2024:   R L  Knee flexion 4- 4  Knee extension 4- 4   Goal status: INITIAL   PLAN: PT FREQUENCY: 1-2x/week  PT DURATION: 8 weeks  PLANNED INTERVENTIONS: Therapeutic exercises, Therapeutic activity, Neuromuscular re-education, Balance training, Gait training, Patient/Family education, Self Care, Joint mobilization, Joint manipulation, Vestibular training, Canalith repositioning, Orthotic/Fit training, DME instructions, Dry Needling, Electrical stimulation, Spinal manipulation, Spinal mobilization, Cryotherapy, Moist heat, Taping, Traction, Ultrasound, Ionotophoresis 4mg /ml Dexamethasone, Manual therapy, and Re-evaluation.  PLAN FOR NEXT SESSION: Review HEP, Progress Knee Strengthening, Hip Strengthening, Knee Flexion stretches, hamstring stretches   Lonni Pall PT, DPT Physical Therapist-   04/12/2024, 12:01 PM

## 2024-04-13 ENCOUNTER — Ambulatory Visit
Admission: RE | Admit: 2024-04-13 | Discharge: 2024-04-13 | Disposition: A | Source: Ambulatory Visit | Attending: General Practice | Admitting: General Practice

## 2024-04-13 DIAGNOSIS — Z1231 Encounter for screening mammogram for malignant neoplasm of breast: Secondary | ICD-10-CM

## 2024-04-13 DIAGNOSIS — Z780 Asymptomatic menopausal state: Secondary | ICD-10-CM

## 2024-04-14 ENCOUNTER — Ambulatory Visit

## 2024-04-14 DIAGNOSIS — M6281 Muscle weakness (generalized): Secondary | ICD-10-CM

## 2024-04-14 DIAGNOSIS — M25561 Pain in right knee: Secondary | ICD-10-CM | POA: Diagnosis not present

## 2024-04-14 DIAGNOSIS — M25661 Stiffness of right knee, not elsewhere classified: Secondary | ICD-10-CM

## 2024-04-14 NOTE — Therapy (Signed)
 OUTPATIENT PHYSICAL THERAPY KNEE TREATMENT  Patient Name: Mercedes White MRN: [id number] DOB:05-05-1956, 68 y.o., female Today's Date: 04/14/2024  END OF SESSION:  PT End of Session - 04/14/24 1307     Visit Number 9    Number of Visits 17    Date for Recertification  05/10/24    PT Start Time 1300    PT Stop Time 1345    PT Time Calculation (min) 45 min    Activity Tolerance Patient tolerated treatment well    Behavior During Therapy [facility] for tasks assessed/performed           Past Medical History:  Diagnosis Date   Allergy    Arthritis    hands    Diverticulosis    Hx of adenomatous polyp of colon 02/17/2019   Migraines    with menses   Past Surgical History:  Procedure Laterality Date   CESAREAN SECTION     x1   COLONOSCOPY  2010   WISDOM TOOTH EXTRACTION     Patient Active Problem List   Diagnosis Date Noted   Establishing care with new doctor, encounter for 03/05/2024   Fall 03/05/2024   Acute pain of right knee 03/05/2024   Hyperlipidemia 03/05/2024   Finger pain, left 11/19/2022   Hx of adenomatous polyp of colon 02/17/2019   Prediabetes 12/02/2017   Osteoarthrosis, generalized, involving multiple sites 11/14/2015   Obesity (BMI 30-39.9) 11/01/2013   Routine general medical examination at a health care facility 11/01/2010   MENOPAUSAL SYNDROME 07/16/2010   Chronic seasonal allergic rhinitis due to pollen 01/14/2008   Premenstrual tension syndrome 01/13/2008    PCP: Vincente Shivers, NP  REFERRING PROVIDER: Vincente Shivers, NP  REFERRING DIAG:  [phone] (ICD-10-CM) - Acute pain of right knee      RATIONALE FOR EVALUATION AND TREATMENT: Rehabilitation  THERAPY DIAG: Acute pain of right knee  Stiffness of right knee, not elsewhere classified  Muscle weakness (generalized)  ONSET DATE: October 10th 2025  FOLLOW-UP APPT SCHEDULED WITH REFERRING PROVIDER: No    SUBJECTIVE:                                                                                                                                                                                          SUBJECTIVE STATEMENT:    Patient reports to OPPT with a chief concern of L knee pain.   PERTINENT HISTORY:   Patient reports a fall on Oct 10th; she reports that she tripped on the last step (going up), her shoes caught the last step and she fell and sprained her ankle. Patient reports that she has used tylenol and voltaren  with minimal relief. She reports that's a reclined position relieves the pain however as soon as she transitions from reclined to seated the pain returns. Patient uses a recumbent elliptical in order to transition from the recliner. Patient reports that the pain prevents her from prolonged standing, sitting and she has to navigate stairs one step at a time.   Denies b/b, Saddle parasthesia, chills/fever, night sweats, nausea, vomiting,   Imaging:   EXAM: 4 VIEW(S) XRAY OF THE RIGHT KNEE 03/05/2024 09:58:31 AM   COMPARISON: None available.   CLINICAL HISTORY: acute right knee pain x 4 weeks after fall.   FINDINGS:   BONES AND JOINTS: No acute fracture. No focal osseous lesion. No joint dislocation. There is a small joint effusion. There is lateral patellofemoral compartment joint space narrowing compatible with moderate degenerative change. There are also mild degenerative changes at the lateral compartment of the knee with joint space narrowing and osteophyte formation.   SOFT TISSUES: The soft tissues are unremarkable.   IMPRESSION: 1. Small joint effusion. 2. No acute findings. 3. Moderate degenerative change of the knee.   Electronically signed by: Greig Pique MD 03/09/2024 11:31 PM EST RP Workstation: [id number]  PAIN:   Pain Intensity: Present: 0/10, Best: /10, Worst: 8/10 Pain location: R knee  Pain quality: intermittent and sharp  Radiating pain: Yes  Swelling: Yes  Numbness/Tingling: No Focal weakness or buckling:  Yes History of prior back, hip, or knee injury, pain, surgery, or therapy: No  PRECAUTIONS: Fall  WEIGHT BEARING RESTRICTIONS: No  FALLS: Has patient fallen in last 6 months? Yes. Number of falls 1  Living Environment Lives with: lives with their spouse Lives in: House/apartment Stairs: Internal 15 Step; Rail Support: Both   Prior level of function: Independent  Occupational demands: Retired   Hobbies: Taking Care of her dog  PATIENT GOALS: Patient would like to be able to walk without wobbling and get my knee stronger.    OBJECTIVE:   Patient Surveys  LEFS  Extreme difficulty/unable (0), Quite a bit of difficulty (1), Moderate difficulty (2), Little difficulty (3), No difficulty (4) Survey date:  03/15/2024  Any of your usual work, housework or school activities 3  2. Usual hobbies, recreational or sporting activities 2  3. Getting into/out of the bath 0  4. Walking between rooms 4  5. Putting on socks/shoes 3  6. Squatting  1  7. Lifting an object, like a bag of groceries from the floor 3  8. Performing light activities around your home 3  9. Performing heavy activities around your home 1  10. Getting into/out of a car 2  11. Walking 2 blocks 1  12. Walking 1 mile 0  13. Going up/down 10 stairs (1 flight) 3  14. Standing for 1 hour 2  15.  sitting for 1 hour 2  16. Running on even ground 0  17. Running on uneven ground 0  18. Making sharp turns while running fast 0  19. Hopping  0  20. Rolling over in bed 2  Score total:  28/80     Cognition WNL     Gross Musculoskeletal Assessment Tremor: None Bulk: Normal Tone: Normal  GAIT: Distance walked: 10 m Assistive device utilized: None Level of assistance: Complete Independence Comments: Decreased weight bearing on R, reciprocal gait pattern   Posture:  AROM AROM (Normal range in degrees) AROM  Hip Right Left  Flexion (125)    Extension (15)    Abduction (40)  Adduction     Internal Rotation  (45)    External Rotation (45)        Knee    Flexion (135) 100 130  Extension (0) 0 0      Ankle    Dorsiflexion (20)    Plantarflexion (50)    Inversion (35)    Eversion (15    (* = pain; Blank rows = not tested)  LE MMT: MMT (out of 5) Right Left  Hip flexion 4- 4-  Hip extension    Hip abduction    Hip adduction    Hip internal rotation    Hip external rotation    Knee flexion 4- 4  Knee extension 4- 4  Ankle dorsiflexion 4+ 4+  Ankle plantarflexion 5 5  Ankle inversion    Ankle eversion    (* = pain; Blank rows = not tested)  Sensation Grossly intact to light touch bilateral LEs as determined by testing dermatomes L2-S2. Proprioception, and hot/cold testing deferred on this date.  Reflexes R/L Knee Jerk (L3/4): 2+/2+  Ankle Jerk (S1/2): 2+/2+   Muscle Length Hamstrings: R: Positive for tightness    Palpation Location LEFT  RIGHT           Quadriceps  1  Medial Hamstrings  0  Lateral Hamstrings  0  Lateral Hamstring tendon  0  Medial Hamstring tendon  0  Quadriceps tendon  0  Patella    Patellar Tendon  1  Tibial Tuberosity    Medial joint line  0  Lateral joint line  0  MCL  0  LCL  0  Adductor Tubercle    Pes Anserine tendon    Infrapatellar fat pad    Fibular head    Popliteal fossa    (Blank rows = not tested) Graded on 0-4 scale (0 = no pain, 1 = pain, 2 = pain with wincing/grimacing/flinching, 3 = pain with withdrawal, 4 = unwilling to allow palpation), (Blank rows = not tested)  Passive Accessory Motion Deferred  VASCULAR Deferred    SPECIAL TESTS  Ligamentous Stability  ACL: Lachman's: R: Negative  Anterior Drawer: R: Negative   MCL: Valgus Stress (30 degrees flexion): R: Negative  LCL: Varus Stress (30 degrees flexion): R: Negative   Functional Tests:  : .75 m/s 5TSTS: 14.07s  TODAY'S TREATMENT: DATE: 04/14/24  Subjective: Patient reports a 3/10 pain in her R knee following a lot of shopping at [employer].  Patient wanted to talk about walking sticks in order to optimize walking capacity. No further questions or concerns.   Therapeutic Exercises:  Knee Extension - Cable Machine   3 x 10 - 15#   Knee Flexion   3 x 10 - 25#    Therapeutic Activity:  Matrix Exercise Bike Level 4-2 x 6 min (Seat 11) for LE strength and endurance for walking capacity; PT manually adjusted resistance throughout per patient tolerance  Time spent discussing walking stick purchase in order to increase walking capacity ( 5 min). Discussed various options and walking sticks that accommodate for different environments.    TRX Squats  3 x 10   Standing 3 Way Hip (Abd, Flexion, Ext)   2 x 5 reps in ea dir - 5# AW  2 x 5 reps in ea dir - with RTB around ankles  Forward Step Up   R 1 x 10 - minor pain at the knee   Lateral Step Down**  R: 1 x 10 - minor FAB with  pain below the knee    PATIENT EDUCATION:  Education details: Exercise Technique Person educated: Patient Education method: Explanation, Demonstration, and Handouts Education comprehension: verbalized understanding and returned demonstration   HOME EXERCISE PROGRAM:  Access Code: [email or web address] URL: [email or web address] Date: 03/17/2024 Prepared by: Lonni Pall  Exercises - Recumbent Bike  - 1 x daily - 3 x weekly - 1 sets - 10 min hold - Seated Piriformis Stretch  - 2 x daily - 7 x weekly - 3 sets - 15- 30s hold - Seated Hamstring Stretch  - 2 x daily - 7 x weekly - 3 sets - 15-30s hold - Seated Knee Flexion Stretch  - 1 x daily - 7 x weekly - 3 sets - 30s hold - Mini Squat with Counter Support  - 2 x daily - 3-4 x weekly - 2 sets - 10-12 reps - Supine Heel Slide with Strap  - 2 x daily - 7 x weekly - 3 sets - 30s hold - Seated Long Arc Quad  - 1 x daily - 3-4 x weekly - 2-3 sets - 10-15 reps  Access Code: [email or web address] URL: [email or web address] Date: 03/15/2024 Prepared by: Lonni Pall  Exercises - Seated  Piriformis Stretch  - 2 x daily - 7 x weekly - 3 sets - 15- 30s hold - Seated Piriformis Stretch  - 2 x daily - 7 x weekly - 2-3 sets - 30s hold - Seated Hamstring Stretch  - 2 x daily - 7 x weekly - 3 sets - 15-30s hold - Mini Squat with Counter Support  - 2 x daily - 3-4 x weekly - 2 sets - 10-12 reps  ASSESSMENT:  CLINICAL IMPRESSION: Continued PT POC in management of R knee pain. Time spent discussing walking stick purchase in order to increase walking capacity. Discussed various options and walking sticks that accommodate for different environments. Good ability to perform 3 Way hip with AW without exacerbation of with SLS. Lack of Eccentric control of quadriceps during descending stairs.  Patient's R knee flexion still significantly less than L knee secondary to pain. PT plans to continue knee/hip strengthening in order to provide improved muscular support to R knee. Based on today's performance patient will benefit from skilled PT in order to address current deficits and improve Quality of life.   OBJECTIVE IMPAIRMENTS: Abnormal gait, cardiopulmonary status limiting activity, decreased endurance, difficulty walking, decreased ROM, decreased strength, and pain.   ACTIVITY LIMITATIONS: sitting, standing, squatting, stairs, and transfers  PARTICIPATION LIMITATIONS: community activity and yard work  PERSONAL FACTORS: Age, Past/current experiences, and Time since onset of injury/illness/exacerbation are also affecting patient's functional outcome.   REHAB POTENTIAL: Good  CLINICAL DECISION MAKING: Evolving/moderate complexity  EVALUATION COMPLEXITY: Moderate   GOALS: Goals reviewed with patient? Yes  SHORT TERM GOALS: Target date: 05/12/2024  Pt will be independent with HEP to improve strength and decrease knee pain to improve pain-free function at home and work. Baseline: 03/15/2024: Initial HEP provided Goal status: INITIAL   LONG TERM GOALS: Target date: 06/09/2024  Pt will  decrease 5TSTS by at least 3 seconds in order to demonstrate clinically significant improvement in LE strength  Baseline: 03/15/2024: 14.07s Goal status: INITIAL  2.  Pt will decrease worst knee pain by at least 3 points on the NPRS in order to demonstrate clinically significant reduction in knee pain. Baseline: 03/15/2024: 8/10 NPS Goal status: INITIAL  3.  Pt will increase LEFS score by at least 9 points in order demonstrate clinically significant reduction in  knee pain/disability.       Baseline: 03/15/2024: 28/80 (80/80 = max disability)  Goal status: INITIAL  4.  Pt will increase strength of Knee flexion/ext to a 4+ grade bilaterally in order to demonstrate improvement in strength and function  Baseline: 03/15/2024:   R L  Knee flexion 4- 4  Knee extension 4- 4   Goal status: INITIAL   PLAN: PT FREQUENCY: 1-2x/week  PT DURATION: 8 weeks  PLANNED INTERVENTIONS: Therapeutic exercises, Therapeutic activity, Neuromuscular re-education, Balance training, Gait training, Patient/Family education, Self Care, Joint mobilization, Joint manipulation, Vestibular training, Canalith repositioning, Orthotic/Fit training, DME instructions, Dry Needling, Electrical stimulation, Spinal manipulation, Spinal mobilization, Cryotherapy, Moist heat, Taping, Traction, Ultrasound, Ionotophoresis 4mg /ml Dexamethasone, Manual therapy, and Re-evaluation.  PLAN FOR NEXT SESSION: Review HEP, Initiate Knee Strengthening, Hip Strengthening, Knee Flexion stretches, hamstring stretches   Lonni Pall PT, DPT Physical Therapist- [place]  04/14/2024, 4:59 PM

## 2024-04-19 ENCOUNTER — Other Ambulatory Visit: Payer: Self-pay | Admitting: General Practice

## 2024-04-19 ENCOUNTER — Telehealth: Payer: Self-pay

## 2024-04-19 ENCOUNTER — Ambulatory Visit

## 2024-04-19 DIAGNOSIS — M25561 Pain in right knee: Secondary | ICD-10-CM

## 2024-04-19 DIAGNOSIS — R928 Other abnormal and inconclusive findings on diagnostic imaging of breast: Secondary | ICD-10-CM

## 2024-04-19 DIAGNOSIS — M25661 Stiffness of right knee, not elsewhere classified: Secondary | ICD-10-CM

## 2024-04-19 DIAGNOSIS — M6281 Muscle weakness (generalized): Secondary | ICD-10-CM

## 2024-04-19 DIAGNOSIS — G8929 Other chronic pain: Secondary | ICD-10-CM

## 2024-04-19 NOTE — Telephone Encounter (Signed)
 Copied from CRM [id number]. Topic: General - Call Back - No Documentation >> Apr 19, 2024 11:40 AM [place] MATSU wrote: Reason for CRM:  Patient called in advising that she is returning Nps call. Per patient she just missed it as she was driving. I checked chart no notes. Per patient she is just returning call.  May you please assist.

## 2024-04-19 NOTE — Therapy (Signed)
 OUTPATIENT PHYSICAL THERAPY KNEE TREATMENT/PROGRESS NOTE Dates of reporting period  03/15/2024   to   04/19/2024    Patient Name: Mercedes White MRN: [id number] DOB:04/22/1956, 68 y.o., female Today's Date: 04/19/2024  END OF SESSION:  PT End of Session - 04/19/24 1032     Visit Number 10    Number of Visits 17    Date for Recertification  05/10/24    PT Start Time 1031    PT Stop Time 1115    PT Time Calculation (min) 44 min    Activity Tolerance Patient tolerated treatment well    Behavior During Therapy [facility] for tasks assessed/performed            Past Medical History:  Diagnosis Date   Allergy    Arthritis    hands    Diverticulosis    Hx of adenomatous polyp of colon 02/17/2019   Migraines    with menses   Past Surgical History:  Procedure Laterality Date   CESAREAN SECTION     x1   COLONOSCOPY  2010   WISDOM TOOTH EXTRACTION     Patient Active Problem List   Diagnosis Date Noted   Establishing care with new doctor, encounter for 03/05/2024   Fall 03/05/2024   Acute pain of right knee 03/05/2024   Hyperlipidemia 03/05/2024   Finger pain, left 11/19/2022   Hx of adenomatous polyp of colon 02/17/2019   Prediabetes 12/02/2017   Osteoarthrosis, generalized, involving multiple sites 11/14/2015   Obesity (BMI 30-39.9) 11/01/2013   Routine general medical examination at a health care facility 11/01/2010   MENOPAUSAL SYNDROME 07/16/2010   Chronic seasonal allergic rhinitis due to pollen 01/14/2008   Premenstrual tension syndrome 01/13/2008    PCP: Vincente Shivers, NP  REFERRING PROVIDER: Vincente Shivers, NP  REFERRING DIAG:  [phone] (ICD-10-CM) - Acute pain of right knee      RATIONALE FOR EVALUATION AND TREATMENT: Rehabilitation  THERAPY DIAG: Acute pain of right knee  Stiffness of right knee, not elsewhere classified  Muscle weakness (generalized)  Chronic pain of right knee  ONSET DATE: October 10th 2025  FOLLOW-UP APPT SCHEDULED WITH  REFERRING PROVIDER: No    SUBJECTIVE:                                                                                                                                                                                         SUBJECTIVE STATEMENT:    Patient reports to OPPT with a chief concern of L knee pain.   PERTINENT HISTORY:   Patient reports a fall on Oct 10th; she reports that she tripped on the last step (going up),  her shoes caught the last step and she fell and sprained her ankle. Patient reports that she has used tylenol and voltaren with minimal relief. She reports that's a reclined position relieves the pain however as soon as she transitions from reclined to seated the pain returns. Patient uses a recumbent elliptical in order to transition from the recliner. Patient reports that the pain prevents her from prolonged standing, sitting and she has to navigate stairs one step at a time.   Denies b/b, Saddle parasthesia, chills/fever, night sweats, nausea, vomiting,   Imaging:   EXAM: 4 VIEW(S) XRAY OF THE RIGHT KNEE 03/05/2024 09:58:31 AM   COMPARISON: None available.   CLINICAL HISTORY: acute right knee pain x 4 weeks after fall.   FINDINGS:   BONES AND JOINTS: No acute fracture. No focal osseous lesion. No joint dislocation. There is a small joint effusion. There is lateral patellofemoral compartment joint space narrowing compatible with moderate degenerative change. There are also mild degenerative changes at the lateral compartment of the knee with joint space narrowing and osteophyte formation.   SOFT TISSUES: The soft tissues are unremarkable.   IMPRESSION: 1. Small joint effusion. 2. No acute findings. 3. Moderate degenerative change of the knee.   Electronically signed by: Greig Pique MD 03/09/2024 11:31 PM EST RP Workstation: [id number]  PAIN:   Pain Intensity: Present: 0/10, Best: /10, Worst: 8/10 Pain location: R knee  Pain quality: intermittent  and sharp  Radiating pain: Yes  Swelling: Yes  Numbness/Tingling: No Focal weakness or buckling: Yes History of prior back, hip, or knee injury, pain, surgery, or therapy: No  PRECAUTIONS: Fall  WEIGHT BEARING RESTRICTIONS: No  FALLS: Has patient fallen in last 6 months? Yes. Number of falls 1  Living Environment Lives with: lives with their spouse Lives in: House/apartment Stairs: Internal 15 Step; Rail Support: Both   Prior level of function: Independent  Occupational demands: Retired   Hobbies: Taking Care of her dog  PATIENT GOALS: Patient would like to be able to walk without wobbling and get my knee stronger.    OBJECTIVE:   Patient Surveys  LEFS  Extreme difficulty/unable (0), Quite a bit of difficulty (1), Moderate difficulty (2), Little difficulty (3), No difficulty (4) Survey date:  03/15/2024  Any of your usual work, housework or school activities 3  2. Usual hobbies, recreational or sporting activities 2  3. Getting into/out of the bath 0  4. Walking between rooms 4  5. Putting on socks/shoes 3  6. Squatting  1  7. Lifting an object, like a bag of groceries from the floor 3  8. Performing light activities around your home 3  9. Performing heavy activities around your home 1  10. Getting into/out of a car 2  11. Walking 2 blocks 1  12. Walking 1 mile 0  13. Going up/down 10 stairs (1 flight) 3  14. Standing for 1 hour 2  15.  sitting for 1 hour 2  16. Running on even ground 0  17. Running on uneven ground 0  18. Making sharp turns while running fast 0  19. Hopping  0  20. Rolling over in bed 2  Score total:  28/80     Cognition WNL     Gross Musculoskeletal Assessment Tremor: None Bulk: Normal Tone: Normal  GAIT: Distance walked: 10 m Assistive device utilized: None Level of assistance: Complete Independence Comments: Decreased weight bearing on R, reciprocal gait pattern   Posture:  AROM AROM (Normal range in  degrees) AROM  Hip  Right Left  Flexion (125)    Extension (15)    Abduction (40)    Adduction     Internal Rotation (45)    External Rotation (45)        Knee    Flexion (135) 100 130  Extension (0) 0 0      Ankle    Dorsiflexion (20)    Plantarflexion (50)    Inversion (35)    Eversion (15    (* = pain; Blank rows = not tested)  LE MMT: MMT (out of 5) Right Left  Hip flexion 4- 4-  Hip extension    Hip abduction    Hip adduction    Hip internal rotation    Hip external rotation    Knee flexion 4- 4  Knee extension 4- 4  Ankle dorsiflexion 4+ 4+  Ankle plantarflexion 5 5  Ankle inversion    Ankle eversion    (* = pain; Blank rows = not tested)  Sensation Grossly intact to light touch bilateral LEs as determined by testing dermatomes L2-S2. Proprioception, and hot/cold testing deferred on this date.  Reflexes R/L Knee Jerk (L3/4): 2+/2+  Ankle Jerk (S1/2): 2+/2+   Muscle Length Hamstrings: R: Positive for tightness    Palpation Location LEFT  RIGHT           Quadriceps  1  Medial Hamstrings  0  Lateral Hamstrings  0  Lateral Hamstring tendon  0  Medial Hamstring tendon  0  Quadriceps tendon  0  Patella    Patellar Tendon  1  Tibial Tuberosity    Medial joint line  0  Lateral joint line  0  MCL  0  LCL  0  Adductor Tubercle    Pes Anserine tendon    Infrapatellar fat pad    Fibular head    Popliteal fossa    (Blank rows = not tested) Graded on 0-4 scale (0 = no pain, 1 = pain, 2 = pain with wincing/grimacing/flinching, 3 = pain with withdrawal, 4 = unwilling to allow palpation), (Blank rows = not tested)  Passive Accessory Motion Deferred  VASCULAR Deferred    SPECIAL TESTS  Ligamentous Stability  ACL: Lachman's: R: Negative  Anterior Drawer: R: Negative   MCL: Valgus Stress (30 degrees flexion): R: Negative  LCL: Varus Stress (30 degrees flexion): R: Negative   Functional Tests:  : .75 m/s 5TSTS: 14.07s  TODAY'S TREATMENT: DATE:  04/19/2024  Subjective: Patient arrives with walking poles and she would like to adjust and review how to walk with ADs.  Patinet reports 3/10 pain in the R knee. No further questions or concerns.   Gait Training:   [employer], Operational with [employer]). Discussed fitting and PT manually adjusted to patients comfort.  PT demosntration on proper step sequencing and pole placement with patient return demo.   Clinic Walking with Trekking poles (400 ft) - no increased knee pain on L knee. Min-Mod VC for proper stepping sequencing with good return demonstration.    Therapeutic Exercises:  Knee Extension - Cable Machine   1 x 10 - 15#  2 x 10 - 20#  Knee Flexion   2 x 10 - 20#   2 x 10 - 25#  Knee Flexion ROM Assessment   120 deg - Supine   Therapeutic Activity:  NuStep L6-4 x 5 min x LE (Seat 9) for LE endurance and strength; PT manually adjusted resistance throughout  bout.   Goblet Squats (10# DB)   2 x 10 - 10# DB   Sit To Stand from Elevated mat table (butt touches)   2 x 10   PATIENT EDUCATION:  Education details: Exercise Technique Person educated: Patient Education method: Explanation, Demonstration, and Handouts Education comprehension: verbalized understanding and returned demonstration   HOME EXERCISE PROGRAM:  Access Code: [email or web address] URL: [email or web address] Date: 03/17/2024 Prepared by: Lonni Pall  Exercises - Recumbent Bike  - 1 x daily - 3 x weekly - 1 sets - 10 min hold - Seated Piriformis Stretch  - 2 x daily - 7 x weekly - 3 sets - 15- 30s hold - Seated Hamstring Stretch  - 2 x daily - 7 x weekly - 3 sets - 15-30s hold - Seated Knee Flexion Stretch  - 1 x daily - 7 x weekly - 3 sets - 30s hold - Mini Squat with Counter Support  - 2 x daily - 3-4 x weekly - 2 sets - 10-12 reps - Supine Heel Slide with Strap  - 2 x daily - 7 x weekly - 3 sets - 30s hold - Seated Long Arc Quad  - 1 x daily - 3-4 x weekly - 2-3 sets -  10-15 reps  Access Code: [email or web address] URL: [email or web address] Date: 03/15/2024 Prepared by: Lonni Pall  Exercises - Seated Piriformis Stretch  - 2 x daily - 7 x weekly - 3 sets - 15- 30s hold - Seated Piriformis Stretch  - 2 x daily - 7 x weekly - 2-3 sets - 30s hold - Seated Hamstring Stretch  - 2 x daily - 7 x weekly - 3 sets - 15-30s hold - Mini Squat with Counter Support  - 2 x daily - 3-4 x weekly - 2 sets - 10-12 reps  ASSESSMENT:  CLINICAL IMPRESSION: Patient arrives to OPPT for 10th visit warranting a progress note. Patient has demonstrated improvements in LE strength, functional movement and pain (See below at goals). Time spent reassessing goals; PT updated 5TSTS and pain score given recent progress. She still has deficits in R knee flexion however no pain against resistance. She endorsed that she's navigating stairs more efficiently and recently purchased trekking sticks in order to increase walking capacity. Remainder of the session focused on gait training with trekking poles and continued LE strengthening. Moderate VC for proper sequencing with poles and stepping pattern. Some time spent on manually adjusting pole height per patient comfort. PT remains appropriate at this time and PT will continue focusing on LE strengthening in order to provide better muscular support. Based on today's performance patient will benefit from skilled PT in order to address current deficits and improve Quality of life.   OBJECTIVE IMPAIRMENTS: Abnormal gait, cardiopulmonary status limiting activity, decreased endurance, difficulty walking, decreased ROM, decreased strength, and pain.   ACTIVITY LIMITATIONS: sitting, standing, squatting, stairs, and transfers  PARTICIPATION LIMITATIONS: community activity and yard work  PERSONAL FACTORS: Age, Past/current experiences, and Time since onset of injury/illness/exacerbation are also affecting patient's functional outcome.   REHAB  POTENTIAL: Good  CLINICAL DECISION MAKING: Evolving/moderate complexity  EVALUATION COMPLEXITY: Moderate   GOALS: Goals reviewed with patient? Yes  SHORT TERM GOALS: Target date: 05/17/2024  Pt will be independent with HEP to improve strength and decrease knee pain to improve pain-free function at home and work. Baseline: 03/15/2024: Initial HEP provided;04/19/2024: Goal status: Progressing    LONG TERM GOALS: Target date: 06/14/2024  Pt will decrease 5TSTS to < 11s seconds in order  to demonstrate clinically significant improvement in LE strength  Baseline: 03/15/2024: 14.07s; 04/19/2024: 11.57s  Goal status: UPDAGTED   2.  Pt will decrease worst knee pain below a 3/10 points on the NPRS in order to demonstrate clinically significant reduction in knee pain. Baseline: 03/15/2024: 8/10 NPS; 04/19/2024: 4-5/10 NPS Goal status: UPDATED   3.  Pt will increase LEFS score by at least 9 points in order demonstrate clinically significant reduction in knee pain/disability.       Baseline: 03/15/2024: 28/80 (80/80 = max disability); 04/19/2024:  Goal status: INITIAL  4.  Pt will increase strength of Knee flexion/ext to a 4+ grade bilaterally in order to demonstrate improvement in strength and function  Baseline: 03/15/2024:  MMT (out of 5) R L 04/19/2024 R/L  Knee flexion 4- 4 4/4+  Knee extension 4- 4 5/5   Goal status: Progressing    PLAN: PT FREQUENCY: 1-2x/week  PT DURATION: 8 weeks  PLANNED INTERVENTIONS: Therapeutic exercises, Therapeutic activity, Neuromuscular re-education, Balance training, Gait training, Patient/Family education, Self Care, Joint mobilization, Joint manipulation, Vestibular training, Canalith repositioning, Orthotic/Fit training, DME instructions, Dry Needling, Electrical stimulation, Spinal manipulation, Spinal mobilization, Cryotherapy, Moist heat, Taping, Traction, Ultrasound, Ionotophoresis 4mg /ml Dexamethasone, Manual therapy, and Re-evaluation.  PLAN  FOR NEXT SESSION: Review HEP, Initiate Knee Strengthening, Hip Strengthening, Knee Flexion stretches, hamstring stretches   Lonni Pall PT, DPT Physical Therapist- [place]  04/19/2024, 12:20 PM

## 2024-04-20 NOTE — Telephone Encounter (Signed)
 Called and discussed results of the mammogram with patient.  She is scheduled for the ultrasound tomorrow.

## 2024-04-21 ENCOUNTER — Ambulatory Visit
Admission: RE | Admit: 2024-04-21 | Discharge: 2024-04-21 | Disposition: A | Discharge status: Home / Self Care | Source: Ambulatory Visit | Attending: General Practice | Admitting: General Practice

## 2024-04-21 ENCOUNTER — Inpatient Hospital Stay: Admission: RE | Admit: 2024-04-21 | Discharge: 2024-04-21 | Attending: General Practice | Admitting: General Practice

## 2024-04-21 ENCOUNTER — Ambulatory Visit

## 2024-04-21 DIAGNOSIS — M25661 Stiffness of right knee, not elsewhere classified: Secondary | ICD-10-CM

## 2024-04-21 DIAGNOSIS — R928 Other abnormal and inconclusive findings on diagnostic imaging of breast: Secondary | ICD-10-CM | POA: Insufficient documentation

## 2024-04-21 DIAGNOSIS — M25561 Pain in right knee: Secondary | ICD-10-CM

## 2024-04-21 DIAGNOSIS — G8929 Other chronic pain: Secondary | ICD-10-CM

## 2024-04-21 DIAGNOSIS — M6281 Muscle weakness (generalized): Secondary | ICD-10-CM

## 2024-04-21 NOTE — Therapy (Signed)
 OUTPATIENT PHYSICAL THERAPY KNEE TREATMENT  Patient Name: Mercedes White MRN: [id number] DOB:11/20/1955, 68 y.o., female Today's Date: 04/21/2024  END OF SESSION:  PT End of Session - 04/21/24 1115     Visit Number 11    Number of Visits 17    Date for Recertification  05/10/24    PT Start Time 1115    PT Stop Time 1200    PT Time Calculation (min) 45 min    Activity Tolerance Patient tolerated treatment well    Behavior During Therapy [facility] for tasks assessed/performed            Past Medical History:  Diagnosis Date   Allergy    Arthritis    hands    Diverticulosis    Hx of adenomatous polyp of colon 02/17/2019   Migraines    with menses   Past Surgical History:  Procedure Laterality Date   CESAREAN SECTION     x1   COLONOSCOPY  2010   WISDOM TOOTH EXTRACTION     Patient Active Problem List   Diagnosis Date Noted   Establishing care with new doctor, encounter for 03/05/2024   Fall 03/05/2024   Acute pain of right knee 03/05/2024   Hyperlipidemia 03/05/2024   Finger pain, left 11/19/2022   Hx of adenomatous polyp of colon 02/17/2019   Prediabetes 12/02/2017   Osteoarthrosis, generalized, involving multiple sites 11/14/2015   Obesity (BMI 30-39.9) 11/01/2013   Routine general medical examination at a health care facility 11/01/2010   MENOPAUSAL SYNDROME 07/16/2010   Chronic seasonal allergic rhinitis due to pollen 01/14/2008   Premenstrual tension syndrome 01/13/2008    PCP: Vincente Shivers, NP  REFERRING PROVIDER: Vincente Shivers, NP  REFERRING DIAG:  [phone] (ICD-10-CM) - Acute pain of right knee      RATIONALE FOR EVALUATION AND TREATMENT: Rehabilitation  THERAPY DIAG: Acute pain of right knee  Stiffness of right knee, not elsewhere classified  Muscle weakness (generalized)  Chronic pain of right knee  ONSET DATE: October 10th 2025  FOLLOW-UP APPT SCHEDULED WITH REFERRING PROVIDER: No    SUBJECTIVE:                                                                                                                                                                                          SUBJECTIVE STATEMENT:    Patient reports to OPPT with a chief concern of L knee pain.   PERTINENT HISTORY:   Patient reports a fall on Oct 10th; she reports that she tripped on the last step (going up), her shoes caught the last step and she fell and sprained her ankle. Patient reports  that she has used tylenol and voltaren with minimal relief. She reports that's a reclined position relieves the pain however as soon as she transitions from reclined to seated the pain returns. Patient uses a recumbent elliptical in order to transition from the recliner. Patient reports that the pain prevents her from prolonged standing, sitting and she has to navigate stairs one step at a time.   Denies b/b, Saddle parasthesia, chills/fever, night sweats, nausea, vomiting,   Imaging:   EXAM: 4 VIEW(S) XRAY OF THE RIGHT KNEE 03/05/2024 09:58:31 AM   COMPARISON: None available.   CLINICAL HISTORY: acute right knee pain x 4 weeks after fall.   FINDINGS:   BONES AND JOINTS: No acute fracture. No focal osseous lesion. No joint dislocation. There is a small joint effusion. There is lateral patellofemoral compartment joint space narrowing compatible with moderate degenerative change. There are also mild degenerative changes at the lateral compartment of the knee with joint space narrowing and osteophyte formation.   SOFT TISSUES: The soft tissues are unremarkable.   IMPRESSION: 1. Small joint effusion. 2. No acute findings. 3. Moderate degenerative change of the knee.   Electronically signed by: Greig Pique MD 03/09/2024 11:31 PM EST RP Workstation: [id number]  PAIN:   Pain Intensity: Present: 0/10, Best: /10, Worst: 8/10 Pain location: R knee  Pain quality: intermittent and sharp  Radiating pain: Yes  Swelling: Yes  Numbness/Tingling:  No Focal weakness or buckling: Yes History of prior back, hip, or knee injury, pain, surgery, or therapy: No  PRECAUTIONS: Fall  WEIGHT BEARING RESTRICTIONS: No  FALLS: Has patient fallen in last 6 months? Yes. Number of falls 1  Living Environment Lives with: lives with their spouse Lives in: House/apartment Stairs: Internal 15 Step; Rail Support: Both   Prior level of function: Independent  Occupational demands: Retired   Hobbies: Taking Care of her dog  PATIENT GOALS: Patient would like to be able to walk without wobbling and get my knee stronger.    OBJECTIVE:   Patient Surveys  LEFS  Extreme difficulty/unable (0), Quite a bit of difficulty (1), Moderate difficulty (2), Little difficulty (3), No difficulty (4) Survey date:  03/15/2024  Any of your usual work, housework or school activities 3  2. Usual hobbies, recreational or sporting activities 2  3. Getting into/out of the bath 0  4. Walking between rooms 4  5. Putting on socks/shoes 3  6. Squatting  1  7. Lifting an object, like a bag of groceries from the floor 3  8. Performing light activities around your home 3  9. Performing heavy activities around your home 1  10. Getting into/out of a car 2  11. Walking 2 blocks 1  12. Walking 1 mile 0  13. Going up/down 10 stairs (1 flight) 3  14. Standing for 1 hour 2  15.  sitting for 1 hour 2  16. Running on even ground 0  17. Running on uneven ground 0  18. Making sharp turns while running fast 0  19. Hopping  0  20. Rolling over in bed 2  Score total:  28/80     Cognition WNL     Gross Musculoskeletal Assessment Tremor: None Bulk: Normal Tone: Normal  GAIT: Distance walked: 10 m Assistive device utilized: None Level of assistance: Complete Independence Comments: Decreased weight bearing on R, reciprocal gait pattern   Posture:  AROM AROM (Normal range in degrees) AROM  Hip Right Left  Flexion (125)    Extension (15)  Abduction (40)     Adduction     Internal Rotation (45)    External Rotation (45)        Knee    Flexion (135) 100 130  Extension (0) 0 0      Ankle    Dorsiflexion (20)    Plantarflexion (50)    Inversion (35)    Eversion (15    (* = pain; Blank rows = not tested)  LE MMT: MMT (out of 5) Right Left  Hip flexion 4- 4-  Hip extension    Hip abduction    Hip adduction    Hip internal rotation    Hip external rotation    Knee flexion 4- 4  Knee extension 4- 4  Ankle dorsiflexion 4+ 4+  Ankle plantarflexion 5 5  Ankle inversion    Ankle eversion    (* = pain; Blank rows = not tested)  Sensation Grossly intact to light touch bilateral LEs as determined by testing dermatomes L2-S2. Proprioception, and hot/cold testing deferred on this date.  Reflexes R/L Knee Jerk (L3/4): 2+/2+  Ankle Jerk (S1/2): 2+/2+   Muscle Length Hamstrings: R: Positive for tightness    Palpation Location LEFT  RIGHT           Quadriceps  1  Medial Hamstrings  0  Lateral Hamstrings  0  Lateral Hamstring tendon  0  Medial Hamstring tendon  0  Quadriceps tendon  0  Patella    Patellar Tendon  1  Tibial Tuberosity    Medial joint line  0  Lateral joint line  0  MCL  0  LCL  0  Adductor Tubercle    Pes Anserine tendon    Infrapatellar fat pad    Fibular head    Popliteal fossa    (Blank rows = not tested) Graded on 0-4 scale (0 = no pain, 1 = pain, 2 = pain with wincing/grimacing/flinching, 3 = pain with withdrawal, 4 = unwilling to allow palpation), (Blank rows = not tested)  Passive Accessory Motion Deferred  VASCULAR Deferred    SPECIAL TESTS  Ligamentous Stability  ACL: Lachman's: R: Negative  Anterior Drawer: R: Negative   MCL: Valgus Stress (30 degrees flexion): R: Negative  LCL: Varus Stress (30 degrees flexion): R: Negative   Functional Tests:  : .75 m/s 5TSTS: 14.07s  TODAY'S TREATMENT: DATE: 04/21/2024  Subjective:  Patinet reports 3-4/10 pain in the R knee.  No further questions or concerns.   Therapeutic Exercises:  Supine Bride Progression 1 x 10  3 x 10 - LE extensed and toes raises - last set on foam roller    Standing TKE against bouncy ball on wall   R: 1 x 10 - blue ball   Standing Heel Raises on balance pad - SUE support   3 x 12 reps   Therapeutic Activity:  NuStep L6-1 x 6 min x LE (Seat 9) for LE endurance and strength; PT manually adjusted resistance throughout per patient's tolerance   Resisted Sit to Stand (20 Mat Table)   3 x 10 - 10# DB   Forward Step Up into Runners Pose   R/L: 2 x 10 - BUE support   Wall Squat with Foam Roller             2 x 10 - Patient almost able to reach 90 deg knee flex   Dumbbell Deadlift   2 x 10 - 10# DB   Kickstand Deadlift   RLE  forward: 2 x 10 - 10# DB  PATIENT EDUCATION:  Education details: Exercise Technique Person educated: Patient Education method: Explanation, Demonstration, and Handouts Education comprehension: verbalized understanding and returned demonstration   HOME EXERCISE PROGRAM:  Access Code: [email or web address] URL: [email or web address] Date: 03/17/2024 Prepared by: Lonni Pall  Exercises - Recumbent Bike  - 1 x daily - 3 x weekly - 1 sets - 10 min hold - Seated Piriformis Stretch  - 2 x daily - 7 x weekly - 3 sets - 15- 30s hold - Seated Hamstring Stretch  - 2 x daily - 7 x weekly - 3 sets - 15-30s hold - Seated Knee Flexion Stretch  - 1 x daily - 7 x weekly - 3 sets - 30s hold - Mini Squat with Counter Support  - 2 x daily - 3-4 x weekly - 2 sets - 10-12 reps - Supine Heel Slide with Strap  - 2 x daily - 7 x weekly - 3 sets - 30s hold - Seated Long Arc Quad  - 1 x daily - 3-4 x weekly - 2-3 sets - 10-15 reps  ASSESSMENT:  CLINICAL IMPRESSION: Patient continued PT POC focused in management of R knee pain. Main focus on strengthening for functional activities and isolated muscles groups for knee joint. Pt tolerated all activities with minimal  pain in the R knee. She endorsed one instance of her knee popping followed by quick sharp pain with forward step ups. Good demosntration on increased knee flexion with wall squats. Multimodal cues for dead lift form with dumbbell. PT plans to continue knee/hip strengthening in order to provide improved muscular support to R knee. Based on today's performance patient will benefit from skilled PT in order to address current deficits and improve Quality of life.   OBJECTIVE IMPAIRMENTS: Abnormal gait, cardiopulmonary status limiting activity, decreased endurance, difficulty walking, decreased ROM, decreased strength, and pain.   ACTIVITY LIMITATIONS: sitting, standing, squatting, stairs, and transfers  PARTICIPATION LIMITATIONS: community activity and yard work  PERSONAL FACTORS: Age, Past/current experiences, and Time since onset of injury/illness/exacerbation are also affecting patient's functional outcome.   REHAB POTENTIAL: Good  CLINICAL DECISION MAKING: Evolving/moderate complexity  EVALUATION COMPLEXITY: Moderate   GOALS: Goals reviewed with patient? Yes  SHORT TERM GOALS: Target date: 05/19/2024  Pt will be independent with HEP to improve strength and decrease knee pain to improve pain-free function at home and work. Baseline: 03/15/2024: Initial HEP provided;04/19/2024: Goal status: Progressing    LONG TERM GOALS: Target date: 06/16/2024  Pt will decrease 5TSTS to < 11s seconds in order to demonstrate clinically significant improvement in LE strength  Baseline: 03/15/2024: 14.07s; 04/19/2024: 11.57s  Goal status: Progressing   2.  Pt will decrease worst knee pain below a 3/10 points on the NPRS in order to demonstrate clinically significant reduction in knee pain. Baseline: 03/15/2024: 8/10 NPS; 04/19/2024: 4-5/10 NPS Goal status: Progressing   3.  Pt will increase LEFS score > 50 points points in order demonstrate clinically significant reduction in knee pain/disability.        Baseline: 03/15/2024: 28/80 (80/80 = no disability); 04/21/2024: 46/80 (80/80 = no disability) Goal status: Progressing   4.  Pt will increase strength of Knee flexion/ext to a 4+ grade bilaterally in order to demonstrate improvement in strength and function  Baseline: 03/15/2024:  MMT (out of 5) R L 04/19/2024 R/L  Knee flexion 4- 4 4/4+  Knee extension 4- 4 5/5   Goal status: Progressing    PLAN: PT FREQUENCY: 1-2x/week  PT DURATION: 8 weeks  PLANNED INTERVENTIONS: Therapeutic exercises, Therapeutic activity, Neuromuscular re-education, Balance training, Gait training, Patient/Family education, Self Care, Joint mobilization, Joint manipulation, Vestibular training, Canalith repositioning, Orthotic/Fit training, DME instructions, Dry Needling, Electrical stimulation, Spinal manipulation, Spinal mobilization, Cryotherapy, Moist heat, Taping, Traction, Ultrasound, Ionotophoresis 4mg /ml Dexamethasone, Manual therapy, and Re-evaluation.  PLAN FOR NEXT SESSION: Review HEP, Initiate Knee Strengthening, Hip Strengthening, Knee Flexion stretches, hamstring stretches   Lonni Pall PT, DPT Physical Therapist- [place]  04/21/2024, 12:18 PM

## 2024-04-22 ENCOUNTER — Ambulatory Visit: Payer: Self-pay | Admitting: General Practice

## 2024-04-26 ENCOUNTER — Ambulatory Visit

## 2024-04-26 DIAGNOSIS — M25561 Pain in right knee: Secondary | ICD-10-CM

## 2024-04-26 DIAGNOSIS — M25661 Stiffness of right knee, not elsewhere classified: Secondary | ICD-10-CM

## 2024-04-26 DIAGNOSIS — M6281 Muscle weakness (generalized): Secondary | ICD-10-CM

## 2024-04-26 NOTE — Therapy (Signed)
 " OUTPATIENT PHYSICAL THERAPY KNEE TREATMENT  Patient Name: Mercedes White MRN: [id number] DOB:09-12-1955, 68 y.o., female Today's Date: 04/26/2024  END OF SESSION:  PT End of Session - 04/26/24 1432     Visit Number 12    Number of Visits 17    Date for Recertification  05/10/24    PT Start Time 1430    PT Stop Time 1510    PT Time Calculation (min) 40 min    Activity Tolerance Patient tolerated treatment well    Behavior During Therapy [facility] for tasks assessed/performed            Past Medical History:  Diagnosis Date   Allergy    Arthritis    hands    Diverticulosis    Hx of adenomatous polyp of colon 02/17/2019   Migraines    with menses   Past Surgical History:  Procedure Laterality Date   CESAREAN SECTION     x1   COLONOSCOPY  2010   WISDOM TOOTH EXTRACTION     Patient Active Problem List   Diagnosis Date Noted   Establishing care with new doctor, encounter for 03/05/2024   Fall 03/05/2024   Acute pain of right knee 03/05/2024   Hyperlipidemia 03/05/2024   Finger pain, left 11/19/2022   Hx of adenomatous polyp of colon 02/17/2019   Prediabetes 12/02/2017   Osteoarthrosis, generalized, involving multiple sites 11/14/2015   Obesity (BMI 30-39.9) 11/01/2013   Routine general medical examination at a health care facility 11/01/2010   MENOPAUSAL SYNDROME 07/16/2010   Chronic seasonal allergic rhinitis due to pollen 01/14/2008   Premenstrual tension syndrome 01/13/2008    PCP: Vincente Shivers, NP  REFERRING PROVIDER: Vincente Shivers, NP  REFERRING DIAG:  [phone] (ICD-10-CM) - Acute pain of right knee      RATIONALE FOR EVALUATION AND TREATMENT: Rehabilitation  THERAPY DIAG: Acute pain of right knee  Muscle weakness (generalized)  Stiffness of right knee, not elsewhere classified  ONSET DATE: October 10th 2025  FOLLOW-UP APPT SCHEDULED WITH REFERRING PROVIDER: No    SUBJECTIVE:                                                                                                                                                                                          SUBJECTIVE STATEMENT:    Patient reports to OPPT with a chief concern of L knee pain.   PERTINENT HISTORY:   Patient reports a fall on Oct 10th; she reports that she tripped on the last step (going up), her shoes caught the last step and she fell and sprained her ankle. Patient reports that she has used tylenol  and voltaren with minimal relief. She reports that's a reclined position relieves the pain however as soon as she transitions from reclined to seated the pain returns. Patient uses a recumbent elliptical in order to transition from the recliner. Patient reports that the pain prevents her from prolonged standing, sitting and she has to navigate stairs one step at a time.   Denies b/b, Saddle parasthesia, chills/fever, night sweats, nausea, vomiting,   Imaging:   EXAM: 4 VIEW(S) XRAY OF THE RIGHT KNEE 03/05/2024 09:58:31 AM   COMPARISON: None available.   CLINICAL HISTORY: acute right knee pain x 4 weeks after fall.   FINDINGS:   BONES AND JOINTS: No acute fracture. No focal osseous lesion. No joint dislocation. There is a small joint effusion. There is lateral patellofemoral compartment joint space narrowing compatible with moderate degenerative change. There are also mild degenerative changes at the lateral compartment of the knee with joint space narrowing and osteophyte formation.   SOFT TISSUES: The soft tissues are unremarkable.   IMPRESSION: 1. Small joint effusion. 2. No acute findings. 3. Moderate degenerative change of the knee.   Electronically signed by: Greig Pique MD 03/09/2024 11:31 PM EST RP Workstation: [id number]  PAIN:   Pain Intensity: Present: 0/10, Best: /10, Worst: 8/10 Pain location: R knee  Pain quality: intermittent and sharp  Radiating pain: Yes  Swelling: Yes  Numbness/Tingling: No Focal weakness or buckling:  Yes History of prior back, hip, or knee injury, pain, surgery, or therapy: No  PRECAUTIONS: Fall  WEIGHT BEARING RESTRICTIONS: No  FALLS: Has patient fallen in last 6 months? Yes. Number of falls 1  Living Environment Lives with: lives with their spouse Lives in: House/apartment Stairs: Internal 15 Step; Rail Support: Both   Prior level of function: Independent  Occupational demands: Retired   Hobbies: Taking Care of her dog  PATIENT GOALS: Patient would like to be able to walk without wobbling and get my knee stronger.    OBJECTIVE:   Patient Surveys  LEFS  Extreme difficulty/unable (0), Quite a bit of difficulty (1), Moderate difficulty (2), Little difficulty (3), No difficulty (4) Survey date:  03/15/2024  Any of your usual work, housework or school activities 3  2. Usual hobbies, recreational or sporting activities 2  3. Getting into/out of the bath 0  4. Walking between rooms 4  5. Putting on socks/shoes 3  6. Squatting  1  7. Lifting an object, like a bag of groceries from the floor 3  8. Performing light activities around your home 3  9. Performing heavy activities around your home 1  10. Getting into/out of a car 2  11. Walking 2 blocks 1  12. Walking 1 mile 0  13. Going up/down 10 stairs (1 flight) 3  14. Standing for 1 hour 2  15.  sitting for 1 hour 2  16. Running on even ground 0  17. Running on uneven ground 0  18. Making sharp turns while running fast 0  19. Hopping  0  20. Rolling over in bed 2  Score total:  28/80     Cognition WNL     Gross Musculoskeletal Assessment Tremor: None Bulk: Normal Tone: Normal  GAIT: Distance walked: 10 m Assistive device utilized: None Level of assistance: Complete Independence Comments: Decreased weight bearing on R, reciprocal gait pattern   Posture:  AROM AROM (Normal range in degrees) AROM  Hip Right Left  Flexion (125)    Extension (15)    Abduction (40)  Adduction     Internal Rotation  (45)    External Rotation (45)        Knee    Flexion (135) 100 130  Extension (0) 0 0      Ankle    Dorsiflexion (20)    Plantarflexion (50)    Inversion (35)    Eversion (15    (* = pain; Blank rows = not tested)  LE MMT: MMT (out of 5) Right Left  Hip flexion 4- 4-  Hip extension    Hip abduction    Hip adduction    Hip internal rotation    Hip external rotation    Knee flexion 4- 4  Knee extension 4- 4  Ankle dorsiflexion 4+ 4+  Ankle plantarflexion 5 5  Ankle inversion    Ankle eversion    (* = pain; Blank rows = not tested)  Sensation Grossly intact to light touch bilateral LEs as determined by testing dermatomes L2-S2. Proprioception, and hot/cold testing deferred on this date.  Reflexes R/L Knee Jerk (L3/4): 2+/2+  Ankle Jerk (S1/2): 2+/2+   Muscle Length Hamstrings: R: Positive for tightness    Palpation Location LEFT  RIGHT           Quadriceps  1  Medial Hamstrings  0  Lateral Hamstrings  0  Lateral Hamstring tendon  0  Medial Hamstring tendon  0  Quadriceps tendon  0  Patella    Patellar Tendon  1  Tibial Tuberosity    Medial joint line  0  Lateral joint line  0  MCL  0  LCL  0  Adductor Tubercle    Pes Anserine tendon    Infrapatellar fat pad    Fibular head    Popliteal fossa    (Blank rows = not tested) Graded on 0-4 scale (0 = no pain, 1 = pain, 2 = pain with wincing/grimacing/flinching, 3 = pain with withdrawal, 4 = unwilling to allow palpation), (Blank rows = not tested)  Passive Accessory Motion Deferred  VASCULAR Deferred    SPECIAL TESTS  Ligamentous Stability  ACL: Lachman's: R: Negative  Anterior Drawer: R: Negative   MCL: Valgus Stress (30 degrees flexion): R: Negative  LCL: Varus Stress (30 degrees flexion): R: Negative   Functional Tests:  : .75 m/s 5TSTS: 14.07s  TODAY'S TREATMENT: DATE: 04/26/2024  Subjective:  Patient reports recent f/u with mammography department. Knee pain is still  persistent but minor. Patient able to perform alternating step pattern in stairs. She would like to review HEP based on new equipment that she purchased. No further questions or concerns.   Therapeutic Exercises :  OMEGA Cable Machine:   Seated Knee Extension 3 x 10, 20#   Seated Hamstring Curl    3 x 10,, 35#  Supine Bride Progression 3 x 10 - last set, LE extended with toe raise  Standing TKE against bouncy ball on wall   R: 3 x 10 x 3s - blue ball   Standing Heel Raises on Airex - no UE support   3 x 12 reps   Therapeutic Activity (30 min - with intent to improve functional movements and increase LE strength for walking capacity) :  NuStep L6-1 x 6 min x LE (Seat 9) for LE endurance and strength; PT manually adjusted resistance throughout per patient's tolerance   Resisted Sit to Stand (20 Mat Table)   3 x 10 - 10# DB   Dumbbell Deadlift   2 x 10 -  10# DB   Education on McConnel Taping for PFPS and proper patellar tracking with KT tape donned (20 min) for increased walking capacity. Education on proper length and medial support with tape in order to reduce patella approximation against medial femoral condyle.   PATIENT EDUCATION:  Education details: Exercise Technique Person educated: Patient Education method: Explanation, Demonstration, and Handouts Education comprehension: verbalized understanding and returned demonstration   HOME EXERCISE PROGRAM:  Access Code: [email or web address] URL: [email or web address] Date: 04/26/2024 Prepared by: Lonni Pall  Exercises - Recumbent Bike  - 2 x daily - 7 x weekly - 1 sets - 10 min hold - Seated Piriformis Stretch  - 2 x daily - 7 x weekly - 3 sets - 15- 30s hold - Seated Hamstring Stretch  - 2 x daily - 7 x weekly - 3 sets - 15-30s hold - Seated Knee Flexion Stretch  - 2 x daily - 7 x weekly - 3 sets - 30s hold - Supine Heel Slide with Strap  - 2 x daily - 7 x weekly - 3 sets - 30s hold - Resisted Sit-to-Stand With  Dumbbell at Chest  - 1 x daily - 3-4 x weekly - 2-3 sets - 10-12 reps - Forward Step Up  - 1 x daily - 3-4 x weekly - 2-3 sets - 10 reps - Side Stepping with Resistance at Thighs  - 1 x daily - 3-4 x weekly - 2-3 sets - 10-12 reps - Standing 3-Way Leg Reach with Resistance at Ankles and Unilateral Counter Support  - 1 x daily - 3-4 x weekly - 2-3 sets - 10 reps - Wall Quarter Squat with Foam Roller  - 1 x daily - 3-4 x weekly - 2-3 sets - 10 reps - Goblet Squat with Kettlebell  - 1 x daily - 3-4 x weekly - 2-3 sets - 10 reps - Deadlift With Dumbbells  - 1 x daily - 7 x weekly - 2-3 sets - 8-10 reps - Seated Ankle Eversion with Resistance  - 2 x daily - 3-4 x weekly - 2-3 sets - 10 reps - Toe Raise With Back Against Wall  - 1 x daily - 3-4 x weekly - 2-3 sets - 10 reps - Terminal Knee Extension with Ball and Counter  - 1 x daily - 3-4 x weekly - 2-3 sets - 10-12 reps - Supine Double Leg Bridge on Box  - 1 x daily - 3-4 x weekly - 2-3 sets - 10-12 reps  ASSESSMENT:  CLINICAL IMPRESSION: Patient continued PT POC focused in management of R knee pain. Main focus on LE strengthening in order to support knee joint. Concordant pain with 90 deg knee flexion with quad contraction. Patient tolerated all PT interventions with minimal to no pain in the R knee. Extensive time spent on taping education in order to increase walking capacity and promote improved patellar tracking. Patient endorsed imrpovements in pain and knee support following taping via McConnel Taping procedure.  PT plans to reassess patient progress towards PT goals in following session. Based on today's performance patient will benefit from skilled PT in order to address current deficits and improve Quality of life.   OBJECTIVE IMPAIRMENTS: Abnormal gait, cardiopulmonary status limiting activity, decreased endurance, difficulty walking, decreased ROM, decreased strength, and pain.   ACTIVITY LIMITATIONS: sitting, standing, squatting, stairs,  and transfers  PARTICIPATION LIMITATIONS: community activity and yard work  PERSONAL FACTORS: Age, Past/current experiences, and Time since onset of injury/illness/exacerbation are also affecting patient's functional outcome.  REHAB POTENTIAL: Good  CLINICAL DECISION MAKING: Evolving/moderate complexity  EVALUATION COMPLEXITY: Moderate   GOALS: Goals reviewed with patient? Yes  SHORT TERM GOALS: Target date: 05/24/2024  Pt will be independent with HEP to improve strength and decrease knee pain to improve pain-free function at home and work. Baseline: 03/15/2024: Initial HEP provided;04/19/2024: Goal status: Progressing    LONG TERM GOALS: Target date: 06/21/2024  Pt will decrease 5TSTS to < 11s seconds in order to demonstrate clinically significant improvement in LE strength  Baseline: 03/15/2024: 14.07s; 04/19/2024: 11.57s  Goal status: Progressing   2.  Pt will decrease worst knee pain below a 3/10 points on the NPRS in order to demonstrate clinically significant reduction in knee pain. Baseline: 03/15/2024: 8/10 NPS; 04/19/2024: 4-5/10 NPS Goal status: Progressing   3.  Pt will increase LEFS score > 50 points points in order demonstrate clinically significant reduction in knee pain/disability.       Baseline: 03/15/2024: 28/80 (80/80 = no disability); 04/21/2024: 46/80 (80/80 = no disability) Goal status: Progressing   4.  Pt will increase strength of Knee flexion/ext to a 4+ grade bilaterally in order to demonstrate improvement in strength and function  Baseline: 03/15/2024:  MMT (out of 5) R L 04/19/2024 R/L  Knee flexion 4- 4 4/4+  Knee extension 4- 4 5/5   Goal status: Progressing    PLAN: PT FREQUENCY: 1-2x/week  PT DURATION: 8 weeks  PLANNED INTERVENTIONS: Therapeutic exercises, Therapeutic activity, Neuromuscular re-education, Balance training, Gait training, Patient/Family education, Self Care, Joint mobilization, Joint manipulation, Vestibular training,  Canalith repositioning, Orthotic/Fit training, DME instructions, Dry Needling, Electrical stimulation, Spinal manipulation, Spinal mobilization, Cryotherapy, Moist heat, Taping, Traction, Ultrasound, Ionotophoresis 4mg /ml Dexamethasone, Manual therapy, and Re-evaluation.  PLAN FOR NEXT SESSION: Review HEP, Initiate Knee Strengthening, Hip Strengthening, Knee Flexion stretches, hamstring stretches   Lonni Pall PT, DPT Physical Therapist- [place]  04/26/2024, 2:36 PM  "

## 2024-04-27 ENCOUNTER — Ambulatory Visit
Admission: RE | Admit: 2024-04-27 | Discharge: 2024-04-27 | Disposition: A | Discharge status: Home / Self Care | Source: Ambulatory Visit | Attending: General Practice | Admitting: General Practice

## 2024-04-27 DIAGNOSIS — N62 Hypertrophy of breast: Secondary | ICD-10-CM | POA: Insufficient documentation

## 2024-04-27 DIAGNOSIS — R928 Other abnormal and inconclusive findings on diagnostic imaging of breast: Secondary | ICD-10-CM

## 2024-04-27 HISTORY — PX: BREAST BIOPSY: SHX20

## 2024-04-27 MED ORDER — LIDOCAINE 1 % OPTIME INJ - NO CHARGE
2.0000 mL | Freq: Once | INTRAMUSCULAR | Status: AC
  Administered 2024-04-27: 2 mL
  Filled 2024-04-27: qty 2

## 2024-04-27 MED ORDER — LIDOCAINE-EPINEPHRINE 1 %-1:100000 IJ SOLN
5.0000 mL | Freq: Once | INTRAMUSCULAR | Status: AC
  Administered 2024-04-27: 5 mL

## 2024-04-28 ENCOUNTER — Ambulatory Visit

## 2024-04-28 DIAGNOSIS — G8929 Other chronic pain: Secondary | ICD-10-CM

## 2024-04-28 DIAGNOSIS — M6281 Muscle weakness (generalized): Secondary | ICD-10-CM

## 2024-04-28 DIAGNOSIS — M25661 Stiffness of right knee, not elsewhere classified: Secondary | ICD-10-CM

## 2024-04-28 DIAGNOSIS — M25561 Pain in right knee: Secondary | ICD-10-CM | POA: Diagnosis not present

## 2024-04-28 LAB — SURGICAL PATHOLOGY

## 2024-04-28 NOTE — Therapy (Signed)
 " OUTPATIENT PHYSICAL THERAPY KNEE TREATMENT  Patient Name: Mercedes White MRN: [id number] DOB:[birth year], 68 y.o., female Today's Date: 04/28/2024  END OF SESSION:  PT End of Session - 04/28/24 0807     Visit Number 13    Number of Visits 17    Date for Recertification  05/10/24    PT Start Time 0808    PT Stop Time 0850    PT Time Calculation (min) 42 min    Activity Tolerance Patient tolerated treatment well    Behavior During Therapy [facility] for tasks assessed/performed             Past Medical History:  Diagnosis Date   Allergy    Arthritis    hands    Diverticulosis    Hx of adenomatous polyp of colon 02/17/2019   Migraines    with menses   Past Surgical History:  Procedure Laterality Date   BREAST BIOPSY Left 04/27/2024   u/s bc ribbon clip path pending   BREAST BIOPSY Left 04/27/2024   US  LT BREAST BX W LOC DEV 1ST LESION IMG BX SPEC US  GUIDE 04/27/2024 [facility]   CESAREAN SECTION     x1   COLONOSCOPY  2010   WISDOM TOOTH EXTRACTION     Patient Active Problem List   Diagnosis Date Noted   Establishing care with new doctor, encounter for 03/05/2024   Fall 03/05/2024   Acute pain of right knee 03/05/2024   Hyperlipidemia 03/05/2024   Finger pain, left 11/19/2022   Hx of adenomatous polyp of colon 02/17/2019   Prediabetes 12/02/2017   Osteoarthrosis, generalized, involving multiple sites 11/14/2015   Obesity (BMI 30-39.9) 11/01/2013   Routine general medical examination at a health care facility 11/01/2010   MENOPAUSAL SYNDROME 07/16/2010   Chronic seasonal allergic rhinitis due to pollen 01/14/2008   Premenstrual tension syndrome 01/13/2008    PCP: Vincente Shivers, NP  REFERRING PROVIDER: Vincente Shivers, NP  REFERRING DIAG:  [phone] (ICD-10-CM) - Acute pain of right knee      RATIONALE FOR EVALUATION AND TREATMENT: Rehabilitation  THERAPY DIAG: Chronic pain of right knee  Muscle weakness (generalized)  Stiffness of right knee,  not elsewhere classified  ONSET DATE: October 10th 2025  FOLLOW-UP APPT SCHEDULED WITH REFERRING PROVIDER: No    SUBJECTIVE:                                                                                                                                                                                         SUBJECTIVE STATEMENT:    Patient reports to OPPT with a chief concern of L knee pain.   PERTINENT HISTORY:  Patient reports a fall on Oct 10th; she reports that she tripped on the last step (going up), her shoes caught the last step and she fell and sprained her ankle. Patient reports that she has used tylenol and voltaren with minimal relief. She reports that's a reclined position relieves the pain however as soon as she transitions from reclined to seated the pain returns. Patient uses a recumbent elliptical in order to transition from the recliner. Patient reports that the pain prevents her from prolonged standing, sitting and she has to navigate stairs one step at a time.   Denies b/b, Saddle parasthesia, chills/fever, night sweats, nausea, vomiting,   Imaging:   EXAM: 4 VIEW(S) XRAY OF THE RIGHT KNEE 03/05/2024 09:58:31 AM   COMPARISON: None available.   CLINICAL HISTORY: acute right knee pain x 4 weeks after fall.   FINDINGS:   BONES AND JOINTS: No acute fracture. No focal osseous lesion. No joint dislocation. There is a small joint effusion. There is lateral patellofemoral compartment joint space narrowing compatible with moderate degenerative change. There are also mild degenerative changes at the lateral compartment of the knee with joint space narrowing and osteophyte formation.   SOFT TISSUES: The soft tissues are unremarkable.   IMPRESSION: 1. Small joint effusion. 2. No acute findings. 3. Moderate degenerative change of the knee.   Electronically signed by: Greig Pique MD 03/09/2024 11:31 PM EST RP Workstation: [id number]  PAIN:   Pain Intensity:  Present: 0/10, Best: /10, Worst: 8/10 Pain location: R knee  Pain quality: intermittent and sharp  Radiating pain: Yes  Swelling: Yes  Numbness/Tingling: No Focal weakness or buckling: Yes History of prior back, hip, or knee injury, pain, surgery, or therapy: No  PRECAUTIONS: Fall  WEIGHT BEARING RESTRICTIONS: No  FALLS: Has patient fallen in last 6 months? Yes. Number of falls 1  Living Environment Lives with: lives with their spouse Lives in: House/apartment Stairs: Internal 15 Step; Rail Support: Both   Prior level of function: Independent  Occupational demands: Retired   Hobbies: Taking Care of her dog  PATIENT GOALS: Patient would like to be able to walk without wobbling and get my knee stronger.    OBJECTIVE:   Patient Surveys  LEFS  Extreme difficulty/unable (0), Quite a bit of difficulty (1), Moderate difficulty (2), Little difficulty (3), No difficulty (4) Survey date:  03/15/2024  Any of your usual work, housework or school activities 3  2. Usual hobbies, recreational or sporting activities 2  3. Getting into/out of the bath 0  4. Walking between rooms 4  5. Putting on socks/shoes 3  6. Squatting  1  7. Lifting an object, like a bag of groceries from the floor 3  8. Performing light activities around your home 3  9. Performing heavy activities around your home 1  10. Getting into/out of a car 2  11. Walking 2 blocks 1  12. Walking 1 mile 0  13. Going up/down 10 stairs (1 flight) 3  14. Standing for 1 hour 2  15.  sitting for 1 hour 2  16. Running on even ground 0  17. Running on uneven ground 0  18. Making sharp turns while running fast 0  19. Hopping  0  20. Rolling over in bed 2  Score total:  28/80     Cognition WNL     Gross Musculoskeletal Assessment Tremor: None Bulk: Normal Tone: Normal  GAIT: Distance walked: 10 m Assistive device utilized: None Level of assistance: Complete Independence  Comments: Decreased weight bearing on R,  reciprocal gait pattern   Posture:  AROM AROM (Normal range in degrees) AROM  Hip Right Left  Flexion (125)    Extension (15)    Abduction (40)    Adduction     Internal Rotation (45)    External Rotation (45)        Knee    Flexion (135) 100 130  Extension (0) 0 0      Ankle    Dorsiflexion (20)    Plantarflexion (50)    Inversion (35)    Eversion (15    (* = pain; Blank rows = not tested)  LE MMT: MMT (out of 5) Right Left  Hip flexion 4- 4-  Hip extension    Hip abduction    Hip adduction    Hip internal rotation    Hip external rotation    Knee flexion 4- 4  Knee extension 4- 4  Ankle dorsiflexion 4+ 4+  Ankle plantarflexion 5 5  Ankle inversion    Ankle eversion    (* = pain; Blank rows = not tested)  Sensation Grossly intact to light touch bilateral LEs as determined by testing dermatomes L2-S2. Proprioception, and hot/cold testing deferred on this date.  Reflexes R/L Knee Jerk (L3/4): 2+/2+  Ankle Jerk (S1/2): 2+/2+   Muscle Length Hamstrings: R: Positive for tightness    Palpation Location LEFT  RIGHT           Quadriceps  1  Medial Hamstrings  0  Lateral Hamstrings  0  Lateral Hamstring tendon  0  Medial Hamstring tendon  0  Quadriceps tendon  0  Patella    Patellar Tendon  1  Tibial Tuberosity    Medial joint line  0  Lateral joint line  0  MCL  0  LCL  0  Adductor Tubercle    Pes Anserine tendon    Infrapatellar fat pad    Fibular head    Popliteal fossa    (Blank rows = not tested) Graded on 0-4 scale (0 = no pain, 1 = pain, 2 = pain with wincing/grimacing/flinching, 3 = pain with withdrawal, 4 = unwilling to allow palpation), (Blank rows = not tested)  Passive Accessory Motion Deferred  VASCULAR Deferred    SPECIAL TESTS  Ligamentous Stability  ACL: Lachman's: R: Negative  Anterior Drawer: R: Negative   MCL: Valgus Stress (30 degrees flexion): R: Negative  LCL: Varus Stress (30 degrees flexion): R: Negative    Functional Tests:  : .75 m/s 5TSTS: 14.07s  TODAY'S TREATMENT: DATE: 04/28/2024  Subjective:  Patient reports that she had a mammography biopsy this AM. Patient reports she is on lifting restrictions (no lifting more 10#) within 24 hours. She reports the [facility] was supportive however she reports that the tape didn't last 24 hrs and she had an adverse skin effect from the glue. Prior to session she reports 3/10 NPS in the R knee.  No further questions or concerns.   Therapeutic Exercises :  OMEGA Cable Machine:   Seated Knee Extension 3 x 10 #   Seated Hamstring Curl    3 x 10,, 35#  Hip Matrix   Hip Abduction   R/L: 2 x 10 - 40#  SL Heel Raise - BUE Support   R/L: 1 x 10 ea side   DL Heel Raise - no ue Support  2 x 15  - Improved heel clearance    Supine Bridge - LE Extended -  Toes Raised   3 x 10   Therapeutic Activity (30 min - with intent to improve functional movements and increase LE strength for walking capacity) :  NuStep L6-1 x 6 min x LE (Seat 9) for LE endurance and strength; PT manually adjusted resistance throughout per patient's tolerance   TRX Squats   2 x 10   Resisted Lateral Stepping   4 x 12' - Blue TB around ankle  4 x 12' - BTB around ankles   Cisco against TB  2 x 12' Blue TB around ankle   2 x 12' Blue TB around ankle   PATIENT EDUCATION:  Education details: Exercise Technique Person educated: Patient Education method: Explanation, Demonstration, and Handouts Education comprehension: verbalized understanding and returned demonstration   HOME EXERCISE PROGRAM:  Access Code: [email or web address] URL: [email or web address] Date: 04/26/2024 Prepared by: Lonni Pall  Exercises - Recumbent Bike  - 2 x daily - 7 x weekly - 1 sets - 10 min hold - Seated Piriformis Stretch  - 2 x daily - 7 x weekly - 3 sets - 15- 30s hold - Seated Hamstring Stretch  - 2 x daily - 7 x weekly - 3 sets - 15-30s hold - Seated Knee  Flexion Stretch  - 2 x daily - 7 x weekly - 3 sets - 30s hold - Supine Heel Slide with Strap  - 2 x daily - 7 x weekly - 3 sets - 30s hold - Resisted Sit-to-Stand With Dumbbell at Chest  - 1 x daily - 3-4 x weekly - 2-3 sets - 10-12 reps - Forward Step Up  - 1 x daily - 3-4 x weekly - 2-3 sets - 10 reps - Side Stepping with Resistance at Thighs  - 1 x daily - 3-4 x weekly - 2-3 sets - 10-12 reps - Standing 3-Way Leg Reach with Resistance at Ankles and Unilateral Counter Support  - 1 x daily - 3-4 x weekly - 2-3 sets - 10 reps - Wall Quarter Squat with Foam Roller  - 1 x daily - 3-4 x weekly - 2-3 sets - 10 reps - Goblet Squat with Kettlebell  - 1 x daily - 3-4 x weekly - 2-3 sets - 10 reps - Deadlift With Dumbbells  - 1 x daily - 7 x weekly - 2-3 sets - 8-10 reps - Seated Ankle Eversion with Resistance  - 2 x daily - 3-4 x weekly - 2-3 sets - 10 reps - Toe Raise With Back Against Wall  - 1 x daily - 3-4 x weekly - 2-3 sets - 10 reps - Terminal Knee Extension with Ball and Counter  - 1 x daily - 3-4 x weekly - 2-3 sets - 10-12 reps - Supine Double Leg Bridge on Box  - 1 x daily - 3-4 x weekly - 2-3 sets - 10-12 reps  ASSESSMENT:  CLINICAL IMPRESSION: Patient continued PT POC focused in management of R knee pain. Session with main focus on LE strengthening in order to support knee joint. Reviewed McConnel Taping and symptom response following last PT session. Pt endorsed that the adhesive glue on the KT tape had an adverse reaction to the her skin. Deferred KT tape this session. No exacerbation of pain with all PT interventions. Patient's knee ROM has improved to ~ 110 deg but she still has persistent knee pain at end range and resisted 90 deg knee flexion. PT POC remains appropriate and will have continued focus on functional strengthening and improving knee  ROM. Based on today's session, patient will continue to benefit from skilled PT in order to maximize return to PLOF and improve QoL.    OBJECTIVE IMPAIRMENTS: Abnormal gait, cardiopulmonary status limiting activity, decreased endurance, difficulty walking, decreased ROM, decreased strength, and pain.   ACTIVITY LIMITATIONS: sitting, standing, squatting, stairs, and transfers  PARTICIPATION LIMITATIONS: community activity and yard work  PERSONAL FACTORS: Age, Past/current experiences, and Time since onset of injury/illness/exacerbation are also affecting patient's functional outcome.   REHAB POTENTIAL: Good  CLINICAL DECISION MAKING: Evolving/moderate complexity  EVALUATION COMPLEXITY: Moderate   GOALS: Goals reviewed with patient? Yes  SHORT TERM GOALS: Target date: 05/26/2024  Pt will be independent with HEP to improve strength and decrease knee pain to improve pain-free function at home and work. Baseline: 03/15/2024: Initial HEP provided;04/19/2024: Goal status: Progressing    LONG TERM GOALS: Target date: 06/23/2024  Pt will decrease 5TSTS to < 11s seconds in order to demonstrate clinically significant improvement in LE strength  Baseline: 03/15/2024: 14.07s; 04/19/2024: 11.57s  Goal status: Progressing   2.  Pt will decrease worst knee pain below a 3/10 points on the NPRS in order to demonstrate clinically significant reduction in knee pain. Baseline: 03/15/2024: 8/10 NPS; 04/19/2024: 4-5/10 NPS Goal status: Progressing   3.  Pt will increase LEFS score > 50 points points in order demonstrate clinically significant reduction in knee pain/disability.       Baseline: 03/15/2024: 28/80 (80/80 = no disability); 04/21/2024: 46/80 (80/80 = no disability) Goal status: Progressing   4.  Pt will increase strength of Knee flexion/ext to a 4+ grade bilaterally in order to demonstrate improvement in strength and function  Baseline: 03/15/2024:  MMT (out of 5) R L 04/19/2024 R/L  Knee flexion 4- 4 4/4+  Knee extension 4- 4 5/5   Goal status: Progressing    PLAN: PT FREQUENCY: 1-2x/week  PT DURATION: 8  weeks  PLANNED INTERVENTIONS: Therapeutic exercises, Therapeutic activity, Neuromuscular re-education, Balance training, Gait training, Patient/Family education, Self Care, Joint mobilization, Joint manipulation, Vestibular training, Canalith repositioning, Orthotic/Fit training, DME instructions, Dry Needling, Electrical stimulation, Spinal manipulation, Spinal mobilization, Cryotherapy, Moist heat, Taping, Traction, Ultrasound, Ionotophoresis 4mg /ml Dexamethasone, Manual therapy, and Re-evaluation.  PLAN FOR NEXT SESSION: Review HEP, Initiate Knee Strengthening, Hip Strengthening, Knee Flexion stretches, hamstring stretches   Lonni Pall PT, DPT Physical Therapist- [place]  04/28/2024, 11:02 AM  "

## 2024-05-03 ENCOUNTER — Ambulatory Visit

## 2024-05-03 DIAGNOSIS — M25561 Pain in right knee: Secondary | ICD-10-CM | POA: Diagnosis not present

## 2024-05-03 DIAGNOSIS — M25661 Stiffness of right knee, not elsewhere classified: Secondary | ICD-10-CM

## 2024-05-03 DIAGNOSIS — M6281 Muscle weakness (generalized): Secondary | ICD-10-CM

## 2024-05-03 NOTE — Therapy (Addendum)
 " OUTPATIENT PHYSICAL THERAPY KNEE TREATMENT  Patient Name: Mercedes White MRN: [id number] DOB:[birth year], 68 y.o., female Today's Date: 05/03/2024  END OF SESSION:  PT End of Session - 05/03/24 1522     Visit Number 14    Number of Visits 17    Date for Recertification  05/10/24    PT Start Time 1515    PT Stop Time 1600    PT Time Calculation (min) 45 min    Activity Tolerance Patient tolerated treatment well    Behavior During Therapy [facility] for tasks assessed/performed              Past Medical History:  Diagnosis Date   Allergy    Arthritis    hands    Diverticulosis    Hx of adenomatous polyp of colon 02/17/2019   Migraines    with menses   Past Surgical History:  Procedure Laterality Date   BREAST BIOPSY Left 04/27/2024   u/s bc ribbon clip path pending   BREAST BIOPSY Left 04/27/2024   US  LT BREAST BX W LOC DEV 1ST LESION IMG BX SPEC US  GUIDE 04/27/2024 [facility]   CESAREAN SECTION     x1   COLONOSCOPY  2010   WISDOM TOOTH EXTRACTION     Patient Active Problem List   Diagnosis Date Noted   Establishing care with new doctor, encounter for 03/05/2024   Fall 03/05/2024   Acute pain of right knee 03/05/2024   Hyperlipidemia 03/05/2024   Finger pain, left 11/19/2022   Hx of adenomatous polyp of colon 02/17/2019   Prediabetes 12/02/2017   Osteoarthrosis, generalized, involving multiple sites 11/14/2015   Obesity (BMI 30-39.9) 11/01/2013   Routine general medical examination at a health care facility 11/01/2010   MENOPAUSAL SYNDROME 07/16/2010   Chronic seasonal allergic rhinitis due to pollen 01/14/2008   Premenstrual tension syndrome 01/13/2008    PCP: Vincente Shivers, NP  REFERRING PROVIDER: Vincente Shivers, NP  REFERRING DIAG:  [phone] (ICD-10-CM) - Acute pain of right knee      RATIONALE FOR EVALUATION AND TREATMENT: Rehabilitation  THERAPY DIAG: Muscle weakness (generalized)  Stiffness of right knee, not elsewhere  classified  ONSET DATE: October 10th 2025  FOLLOW-UP APPT SCHEDULED WITH REFERRING PROVIDER: No    SUBJECTIVE:                                                                                                                                                                                         SUBJECTIVE STATEMENT:    Patient reports to OPPT with a chief concern of L knee pain.   PERTINENT HISTORY:   Patient reports a fall  on Oct 10th; she reports that she tripped on the last step (going up), her shoes caught the last step and she fell and sprained her ankle. Patient reports that she has used tylenol and voltaren with minimal relief. She reports that's a reclined position relieves the pain however as soon as she transitions from reclined to seated the pain returns. Patient uses a recumbent elliptical in order to transition from the recliner. Patient reports that the pain prevents her from prolonged standing, sitting and she has to navigate stairs one step at a time.   Denies b/b, Saddle parasthesia, chills/fever, night sweats, nausea, vomiting,   Imaging:   EXAM: 4 VIEW(S) XRAY OF THE RIGHT KNEE 03/05/2024 09:58:31 AM   COMPARISON: None available.   CLINICAL HISTORY: acute right knee pain x 4 weeks after fall.   FINDINGS:   BONES AND JOINTS: No acute fracture. No focal osseous lesion. No joint dislocation. There is a small joint effusion. There is lateral patellofemoral compartment joint space narrowing compatible with moderate degenerative change. There are also mild degenerative changes at the lateral compartment of the knee with joint space narrowing and osteophyte formation.   SOFT TISSUES: The soft tissues are unremarkable.   IMPRESSION: 1. Small joint effusion. 2. No acute findings. 3. Moderate degenerative change of the knee.   Electronically signed by: Greig Pique MD 03/09/2024 11:31 PM EST RP Workstation: [id number]  PAIN:   Pain Intensity: Present: 0/10,  Best: /10, Worst: 8/10 Pain location: R knee  Pain quality: intermittent and sharp  Radiating pain: Yes  Swelling: Yes  Numbness/Tingling: No Focal weakness or buckling: Yes History of prior back, hip, or knee injury, pain, surgery, or therapy: No  PRECAUTIONS: Fall  WEIGHT BEARING RESTRICTIONS: No  FALLS: Has patient fallen in last 6 months? Yes. Number of falls 1  Living Environment Lives with: lives with their spouse Lives in: House/apartment Stairs: Internal 15 Step; Rail Support: Both   Prior level of function: Independent  Occupational demands: Retired   Hobbies: Taking Care of her dog  PATIENT GOALS: Patient would like to be able to walk without wobbling and get my knee stronger.    OBJECTIVE:   Patient Surveys  LEFS  Extreme difficulty/unable (0), Quite a bit of difficulty (1), Moderate difficulty (2), Little difficulty (3), No difficulty (4) Survey date:  03/15/2024  Any of your usual work, housework or school activities 3  2. Usual hobbies, recreational or sporting activities 2  3. Getting into/out of the bath 0  4. Walking between rooms 4  5. Putting on socks/shoes 3  6. Squatting  1  7. Lifting an object, like a bag of groceries from the floor 3  8. Performing light activities around your home 3  9. Performing heavy activities around your home 1  10. Getting into/out of a car 2  11. Walking 2 blocks 1  12. Walking 1 mile 0  13. Going up/down 10 stairs (1 flight) 3  14. Standing for 1 hour 2  15.  sitting for 1 hour 2  16. Running on even ground 0  17. Running on uneven ground 0  18. Making sharp turns while running fast 0  19. Hopping  0  20. Rolling over in bed 2  Score total:  28/80     Cognition WNL     Gross Musculoskeletal Assessment Tremor: None Bulk: Normal Tone: Normal  GAIT: Distance walked: 10 m Assistive device utilized: None Level of assistance: Complete Independence Comments: Decreased weight bearing  on R, reciprocal  gait pattern   Posture:  AROM AROM (Normal range in degrees) AROM  Hip Right Left  Flexion (125)    Extension (15)    Abduction (40)    Adduction     Internal Rotation (45)    External Rotation (45)        Knee    Flexion (135) 100 130  Extension (0) 0 0      Ankle    Dorsiflexion (20)    Plantarflexion (50)    Inversion (35)    Eversion (15    (* = pain; Blank rows = not tested)  LE MMT: MMT (out of 5) Right Left  Hip flexion 4- 4-  Hip extension    Hip abduction    Hip adduction    Hip internal rotation    Hip external rotation    Knee flexion 4- 4  Knee extension 4- 4  Ankle dorsiflexion 4+ 4+  Ankle plantarflexion 5 5  Ankle inversion    Ankle eversion    (* = pain; Blank rows = not tested)  Sensation Grossly intact to light touch bilateral LEs as determined by testing dermatomes L2-S2. Proprioception, and hot/cold testing deferred on this date.  Reflexes R/L Knee Jerk (L3/4): 2+/2+  Ankle Jerk (S1/2): 2+/2+   Muscle Length Hamstrings: R: Positive for tightness    Palpation Location LEFT  RIGHT           Quadriceps  1  Medial Hamstrings  0  Lateral Hamstrings  0  Lateral Hamstring tendon  0  Medial Hamstring tendon  0  Quadriceps tendon  0  Patella    Patellar Tendon  1  Tibial Tuberosity    Medial joint line  0  Lateral joint line  0  MCL  0  LCL  0  Adductor Tubercle    Pes Anserine tendon    Infrapatellar fat pad    Fibular head    Popliteal fossa    (Blank rows = not tested) Graded on 0-4 scale (0 = no pain, 1 = pain, 2 = pain with wincing/grimacing/flinching, 3 = pain with withdrawal, 4 = unwilling to allow palpation), (Blank rows = not tested)  Passive Accessory Motion Deferred  VASCULAR Deferred    SPECIAL TESTS  Ligamentous Stability  ACL: Lachman's: R: Negative  Anterior Drawer: R: Negative   MCL: Valgus Stress (30 degrees flexion): R: Negative  LCL: Varus Stress (30 degrees flexion): R: Negative    Functional Tests:  : .75 m/s 5TSTS: 14.07s  TODAY'S TREATMENT: DATE: 05/03/2024  Subjective:  Patient reports that her pain was minimal until this morning. Endorses morning stiffness and didn't do HEP prior to session. No further questions or concerns.   Therapeutic Exercises :  OMEGA Cable Machine:   Seated Knee Extension 3 x 10 #15   Seated Hamstring Curl    3 x 10, 35#  Hip Matrix   Hip Abduction   R/L: 2 x 10 - 40#  Supine Bridge - LE Extended - Toes Raised   3 x 10   Manual Therapy:  Light STM applied to R quadricep musculature for pain modulation and decrease muscle tension. Myofascial release, trigger point and contract and relax techniques utilized. Pt endorsed improvements in pain following intervention.   Therapeutic Activity (30 min - with intent to improve functional movements and increase LE strength for walking capacity) :  NuStep L6-1 x 6 min x LE (Seat 9) for LE endurance and strength; PT  manually adjusted resistance throughout per patient's tolerance   TRX Squats   2 x 10   Resisted Lateral Stepping   4 x 12' - Blue TB around thigh   Monster Walk against TB   4 x 12' Blue TB around ankle   4 x 12'     Stair way navigation with single rail support  3 times - no patellar pain following manual techniques.    PATIENT EDUCATION:  Education details: Exercise Technique Person educated: Patient Education method: Explanation, Demonstration, and Handouts Education comprehension: verbalized understanding and returned demonstration   HOME EXERCISE PROGRAM:  Access Code: [email or web address] URL: [email or web address] Date: 04/26/2024 Prepared by: Lonni Pall  Exercises - Recumbent Bike  - 2 x daily - 7 x weekly - 1 sets - 10 min hold - Seated Piriformis Stretch  - 2 x daily - 7 x weekly - 3 sets - 15- 30s hold - Seated Hamstring Stretch  - 2 x daily - 7 x weekly - 3 sets - 15-30s hold - Seated Knee Flexion Stretch  - 2 x daily - 7  x weekly - 3 sets - 30s hold - Supine Heel Slide with Strap  - 2 x daily - 7 x weekly - 3 sets - 30s hold - Resisted Sit-to-Stand With Dumbbell at Chest  - 1 x daily - 3-4 x weekly - 2-3 sets - 10-12 reps - Forward Step Up  - 1 x daily - 3-4 x weekly - 2-3 sets - 10 reps - Side Stepping with Resistance at Thighs  - 1 x daily - 3-4 x weekly - 2-3 sets - 10-12 reps - Standing 3-Way Leg Reach with Resistance at Ankles and Unilateral Counter Support  - 1 x daily - 3-4 x weekly - 2-3 sets - 10 reps - Wall Quarter Squat with Foam Roller  - 1 x daily - 3-4 x weekly - 2-3 sets - 10 reps - Goblet Squat with Kettlebell  - 1 x daily - 3-4 x weekly - 2-3 sets - 10 reps - Deadlift With Dumbbells  - 1 x daily - 7 x weekly - 2-3 sets - 8-10 reps - Seated Ankle Eversion with Resistance  - 2 x daily - 3-4 x weekly - 2-3 sets - 10 reps - Toe Raise With Back Against Wall  - 1 x daily - 3-4 x weekly - 2-3 sets - 10 reps - Terminal Knee Extension with Ball and Counter  - 1 x daily - 3-4 x weekly - 2-3 sets - 10-12 reps - Supine Double Leg Bridge on Box  - 1 x daily - 3-4 x weekly - 2-3 sets - 10-12 reps  ASSESSMENT:  CLINICAL IMPRESSION: Continued PT POC in management of R knee pain. Despite progress in LE strength and improved ability for functional transfers the patient still presenting with persistent pain in the R knee joint, particularly in the R medial joint line at 90 deg of flexion.  Educated patient on exercises within a pain free range and refrain from resistive exercises at 90 deg of knee flexion. Manual techniques utilized to relieve muscle tension in the quadriceps muscles. Patient's pain still persistent in the R knee around patellar tendon; pain limiting her from full participation with ADLs and recreational tasks. She was able to navigate stairs with rail support without pain in the R knee.  Based on today's session, patient will continue to benefit from skilled PT in order to maximize return to PLOF and  improve QoL.  OBJECTIVE IMPAIRMENTS: Abnormal gait, cardiopulmonary status limiting activity, decreased endurance, difficulty walking, decreased ROM, decreased strength, and pain.   ACTIVITY LIMITATIONS: sitting, standing, squatting, stairs, and transfers  PARTICIPATION LIMITATIONS: community activity and yard work  PERSONAL FACTORS: Age, Past/current experiences, and Time since onset of injury/illness/exacerbation are also affecting patient's functional outcome.   REHAB POTENTIAL: Good  CLINICAL DECISION MAKING: Evolving/moderate complexity  EVALUATION COMPLEXITY: Moderate   GOALS: Goals reviewed with patient? Yes  SHORT TERM GOALS: Target date: 05/31/2024  Pt will be independent with HEP to improve strength and decrease knee pain to improve pain-free function at home and work. Baseline: 03/15/2024: Initial HEP provided;04/19/2024: Goal status: Progressing    LONG TERM GOALS: Target date: 06/28/2024  Pt will decrease 5TSTS to < 11s seconds in order to demonstrate clinically significant improvement in LE strength  Baseline: 03/15/2024: 14.07s; 04/19/2024: 11.57s  Goal status: Progressing   2.  Pt will decrease worst knee pain below a 3/10 points on the NPRS in order to demonstrate clinically significant reduction in knee pain. Baseline: 03/15/2024: 8/10 NPS; 04/19/2024: 4-5/10 NPS Goal status: Progressing   3.  Pt will increase LEFS score > 50 points points in order demonstrate clinically significant reduction in knee pain/disability.       Baseline: 03/15/2024: 28/80 (80/80 = no disability); 04/21/2024: 46/80 (80/80 = no disability) Goal status: Progressing   4.  Pt will increase strength of Knee flexion/ext to a 4+ grade bilaterally in order to demonstrate improvement in strength and function  Baseline: 03/15/2024:  MMT (out of 5) R L 04/19/2024 R/L  Knee flexion 4- 4 4/4+  Knee extension 4- 4 5/5   Goal status: Progressing    PLAN: PT FREQUENCY: 1-2x/week  PT  DURATION: 8 weeks  PLANNED INTERVENTIONS: Therapeutic exercises, Therapeutic activity, Neuromuscular re-education, Balance training, Gait training, Patient/Family education, Self Care, Joint mobilization, Joint manipulation, Vestibular training, Canalith repositioning, Orthotic/Fit training, DME instructions, Dry Needling, Electrical stimulation, Spinal manipulation, Spinal mobilization, Cryotherapy, Moist heat, Taping, Traction, Ultrasound, Ionotophoresis 4mg /ml Dexamethasone, Manual therapy, and Re-evaluation.  PLAN FOR NEXT SESSION: Review HEP, Initiate Knee Strengthening, Hip Strengthening, Knee Flexion stretches, hamstring stretches   Lonni Pall PT, DPT Physical Therapist- [place]  05/03/2024, 4:10 PM  "

## 2024-05-05 ENCOUNTER — Ambulatory Visit

## 2024-05-05 DIAGNOSIS — M6281 Muscle weakness (generalized): Secondary | ICD-10-CM

## 2024-05-05 DIAGNOSIS — M25661 Stiffness of right knee, not elsewhere classified: Secondary | ICD-10-CM

## 2024-05-05 DIAGNOSIS — G8929 Other chronic pain: Secondary | ICD-10-CM

## 2024-05-05 DIAGNOSIS — M25561 Pain in right knee: Secondary | ICD-10-CM | POA: Diagnosis not present

## 2024-05-05 NOTE — Therapy (Signed)
 " OUTPATIENT PHYSICAL THERAPY KNEE TREATMENT  Patient Name: Mercedes White MRN: [id number] DOB:[birth year], 68 y.o., female Today's Date: 05/05/2024  END OF SESSION:  PT End of Session - 05/05/24 1436     Visit Number 15    Number of Visits 17    Date for Recertification  05/10/24    PT Start Time 1432    PT Stop Time 1520    PT Time Calculation (min) 48 min    Activity Tolerance Patient tolerated treatment well    Behavior During Therapy [facility] for tasks assessed/performed          Past Medical History:  Diagnosis Date   Allergy    Arthritis    hands    Diverticulosis    Hx of adenomatous polyp of colon 02/17/2019   Migraines    with menses   Past Surgical History:  Procedure Laterality Date   BREAST BIOPSY Left 04/27/2024   u/s bc ribbon clip path pending   BREAST BIOPSY Left 04/27/2024   US  LT BREAST BX W LOC DEV 1ST LESION IMG BX SPEC US  GUIDE 04/27/2024 [facility]   CESAREAN SECTION     x1   COLONOSCOPY  2010   WISDOM TOOTH EXTRACTION     Patient Active Problem List   Diagnosis Date Noted   Establishing care with new doctor, encounter for 03/05/2024   Fall 03/05/2024   Acute pain of right knee 03/05/2024   Hyperlipidemia 03/05/2024   Finger pain, left 11/19/2022   Hx of adenomatous polyp of colon 02/17/2019   Prediabetes 12/02/2017   Osteoarthrosis, generalized, involving multiple sites 11/14/2015   Obesity (BMI 30-39.9) 11/01/2013   Routine general medical examination at a health care facility 11/01/2010   MENOPAUSAL SYNDROME 07/16/2010   Chronic seasonal allergic rhinitis due to pollen 01/14/2008   Premenstrual tension syndrome 01/13/2008    PCP: Vincente Shivers, NP  REFERRING PROVIDER: Vincente Shivers, NP  REFERRING DIAG:  [phone] (ICD-10-CM) - Acute pain of right knee      RATIONALE FOR EVALUATION AND TREATMENT: Rehabilitation  THERAPY DIAG: Muscle weakness (generalized)  Stiffness of right knee, not elsewhere classified  Chronic  pain of right knee  ONSET DATE: October 10th 2025  FOLLOW-UP APPT SCHEDULED WITH REFERRING PROVIDER: No    SUBJECTIVE:                                                                                                                                                                                         SUBJECTIVE STATEMENT:    Patient reports to OPPT with a chief concern of L knee pain.   PERTINENT HISTORY:   Patient reports  a fall on Oct 10th; she reports that she tripped on the last step (going up), her shoes caught the last step and she fell and sprained her ankle. Patient reports that she has used tylenol and voltaren with minimal relief. She reports that's a reclined position relieves the pain however as soon as she transitions from reclined to seated the pain returns. Patient uses a recumbent elliptical in order to transition from the recliner. Patient reports that the pain prevents her from prolonged standing, sitting and she has to navigate stairs one step at a time.   Denies b/b, Saddle parasthesia, chills/fever, night sweats, nausea, vomiting,   Imaging:   EXAM: 4 VIEW(S) XRAY OF THE RIGHT KNEE 03/05/2024 09:58:31 AM   COMPARISON: None available.   CLINICAL HISTORY: acute right knee pain x 4 weeks after fall.   FINDINGS:   BONES AND JOINTS: No acute fracture. No focal osseous lesion. No joint dislocation. There is a small joint effusion. There is lateral patellofemoral compartment joint space narrowing compatible with moderate degenerative change. There are also mild degenerative changes at the lateral compartment of the knee with joint space narrowing and osteophyte formation.   SOFT TISSUES: The soft tissues are unremarkable.   IMPRESSION: 1. Small joint effusion. 2. No acute findings. 3. Moderate degenerative change of the knee.   Electronically signed by: Greig Pique MD 03/09/2024 11:31 PM EST RP Workstation: [id number]  PAIN:   Pain Intensity:  Present: 0/10, Best: /10, Worst: 8/10 Pain location: R knee  Pain quality: intermittent and sharp  Radiating pain: Yes  Swelling: Yes  Numbness/Tingling: No Focal weakness or buckling: Yes History of prior back, hip, or knee injury, pain, surgery, or therapy: No  PRECAUTIONS: Fall  WEIGHT BEARING RESTRICTIONS: No  FALLS: Has patient fallen in last 6 months? Yes. Number of falls 1  Living Environment Lives with: lives with their spouse Lives in: House/apartment Stairs: Internal 15 Step; Rail Support: Both   Prior level of function: Independent  Occupational demands: Retired   Hobbies: Taking Care of her dog  PATIENT GOALS: Patient would like to be able to walk without wobbling and get my knee stronger.    OBJECTIVE:   Patient Surveys  LEFS  Extreme difficulty/unable (0), Quite a bit of difficulty (1), Moderate difficulty (2), Little difficulty (3), No difficulty (4) Survey date:  03/15/2024  Any of your usual work, housework or school activities 3  2. Usual hobbies, recreational or sporting activities 2  3. Getting into/out of the bath 0  4. Walking between rooms 4  5. Putting on socks/shoes 3  6. Squatting  1  7. Lifting an object, like a bag of groceries from the floor 3  8. Performing light activities around your home 3  9. Performing heavy activities around your home 1  10. Getting into/out of a car 2  11. Walking 2 blocks 1  12. Walking 1 mile 0  13. Going up/down 10 stairs (1 flight) 3  14. Standing for 1 hour 2  15.  sitting for 1 hour 2  16. Running on even ground 0  17. Running on uneven ground 0  18. Making sharp turns while running fast 0  19. Hopping  0  20. Rolling over in bed 2  Score total:  28/80     Cognition WNL     Gross Musculoskeletal Assessment Tremor: None Bulk: Normal Tone: Normal  GAIT: Distance walked: 10 m Assistive device utilized: None Level of assistance: Complete Independence Comments: Decreased  weight bearing on R,  reciprocal gait pattern   Posture:  AROM AROM (Normal range in degrees) AROM  Hip Right Left  Flexion (125)    Extension (15)    Abduction (40)    Adduction     Internal Rotation (45)    External Rotation (45)        Knee    Flexion (135) 100 130  Extension (0) 0 0      Ankle    Dorsiflexion (20)    Plantarflexion (50)    Inversion (35)    Eversion (15    (* = pain; Blank rows = not tested)  LE MMT: MMT (out of 5) Right Left  Hip flexion 4- 4-  Hip extension    Hip abduction    Hip adduction    Hip internal rotation    Hip external rotation    Knee flexion 4- 4  Knee extension 4- 4  Ankle dorsiflexion 4+ 4+  Ankle plantarflexion 5 5  Ankle inversion    Ankle eversion    (* = pain; Blank rows = not tested)  Sensation Grossly intact to light touch bilateral LEs as determined by testing dermatomes L2-S2. Proprioception, and hot/cold testing deferred on this date.  Reflexes R/L Knee Jerk (L3/4): 2+/2+  Ankle Jerk (S1/2): 2+/2+   Muscle Length Hamstrings: R: Positive for tightness    Palpation Location LEFT  RIGHT           Quadriceps  1  Medial Hamstrings  0  Lateral Hamstrings  0  Lateral Hamstring tendon  0  Medial Hamstring tendon  0  Quadriceps tendon  0  Patella    Patellar Tendon  1  Tibial Tuberosity    Medial joint line  0  Lateral joint line  0  MCL  0  LCL  0  Adductor Tubercle    Pes Anserine tendon    Infrapatellar fat pad    Fibular head    Popliteal fossa    (Blank rows = not tested) Graded on 0-4 scale (0 = no pain, 1 = pain, 2 = pain with wincing/grimacing/flinching, 3 = pain with withdrawal, 4 = unwilling to allow palpation), (Blank rows = not tested)  Passive Accessory Motion Deferred  VASCULAR Deferred    SPECIAL TESTS  Ligamentous Stability  ACL: Lachman's: R: Negative  Anterior Drawer: R: Negative   MCL: Valgus Stress (30 degrees flexion): R: Negative  LCL: Varus Stress (30 degrees flexion): R: Negative    Functional Tests:  : .75 m/s 5TSTS: 14.07s  TODAY'S TREATMENT: DATE: 05/05/2024  Subjective:  Patient reports R knee pain at 3/10 at the start of the session. She reports that she went to [facility] and her knee buckled on her when trying to catch up with her husband.  No further questions or concerns.   Therapeutic Exercises :  OMEGA Cable Machine:   Seated Knee Extension 1 x 10 #15 2 x 10 #20    Seated Hamstring Curl    3 x 10, 35#  Hip Matrix   Hip Abduction   R/L: 3 x 10 - 40#  Passive Knee Flexion Stretch   R: 4 min in order to improve ROM//tissue extensibility  - able to achieve 120 deg - Supine    Therapeutic Activity (with intent to improve functional movements and increase LE strength for walking capacity) :  NuStep - Pace Partner mode - L6-3 x 6 min x LE Only (Seat 9) SPM > 100 for LE  endurance and strength; PT manually adjusted resistance throughout per patient's tolerance.   TRX Squats   2 x 10   Resisted Partial Squats - BUE holding 5# DB   1 x 10   Resisted Side Stepping  4 x 12' - Blue TB around ankles 4 x 12' - Blue TB around ankles   4 x 12' blue TB around ankles a   PATIENT EDUCATION:  Education details: Exercise Technique Person educated: Patient Education method: Explanation, Demonstration, and Handouts Education comprehension: verbalized understanding and returned demonstration   HOME EXERCISE PROGRAM:  Access Code: [email or web address] URL: [email or web address] Date: 04/26/2024 Prepared by: Lonni Pall  Exercises - Recumbent Bike  - 2 x daily - 7 x weekly - 1 sets - 10 min hold - Seated Piriformis Stretch  - 2 x daily - 7 x weekly - 3 sets - 15- 30s hold - Seated Hamstring Stretch  - 2 x daily - 7 x weekly - 3 sets - 15-30s hold - Seated Knee Flexion Stretch  - 2 x daily - 7 x weekly - 3 sets - 30s hold - Supine Heel Slide with Strap  - 2 x daily - 7 x weekly - 3 sets - 30s hold - Resisted Sit-to-Stand With Dumbbell at  Chest  - 1 x daily - 3-4 x weekly - 2-3 sets - 10-12 reps - Forward Step Up  - 1 x daily - 3-4 x weekly - 2-3 sets - 10 reps - Side Stepping with Resistance at Thighs  - 1 x daily - 3-4 x weekly - 2-3 sets - 10-12 reps - Standing 3-Way Leg Reach with Resistance at Ankles and Unilateral Counter Support  - 1 x daily - 3-4 x weekly - 2-3 sets - 10 reps - Wall Quarter Squat with Foam Roller  - 1 x daily - 3-4 x weekly - 2-3 sets - 10 reps - Goblet Squat with Kettlebell  - 1 x daily - 3-4 x weekly - 2-3 sets - 10 reps - Deadlift With Dumbbells  - 1 x daily - 7 x weekly - 2-3 sets - 8-10 reps - Seated Ankle Eversion with Resistance  - 2 x daily - 3-4 x weekly - 2-3 sets - 10 reps - Toe Raise With Back Against Wall  - 1 x daily - 3-4 x weekly - 2-3 sets - 10 reps - Terminal Knee Extension with Ball and Counter  - 1 x daily - 3-4 x weekly - 2-3 sets - 10-12 reps - Supine Double Leg Bridge on Box  - 1 x daily - 3-4 x weekly - 2-3 sets - 10-12 reps  ASSESSMENT:  CLINICAL IMPRESSION: Continued PT POC in management of R knee pain. Patellar pain from previous session with improvements / without improvements following manual techniques to quadriceps muscle. PT with continued focus on R knee strengthening within tolerable range ( < 90 deg of knee flexion). Good participation throughout session and she tolerated increase in intensity with quadriceps strengthening. Some time with passive stretch techniques in order to achieve increased knee flexion ROM; pt able to achieve 120 deg with minor pain at end range. Advise on step to pattern with RLE leading on stairs in order to improve stair way navigation and decrease pain in the R knee. Patient's pain still persistent in the R knee around patellar tendon; pain limiting her from full participation with ADLs and recreational tasks. Based on today's session, patient will continue to benefit from skilled PT in order to maximize  return to PLOF and improve QoL.   OBJECTIVE  IMPAIRMENTS: Abnormal gait, cardiopulmonary status limiting activity, decreased endurance, difficulty walking, decreased ROM, decreased strength, and pain.   ACTIVITY LIMITATIONS: sitting, standing, squatting, stairs, and transfers  PARTICIPATION LIMITATIONS: community activity and yard work  PERSONAL FACTORS: Age, Past/current experiences, and Time since onset of injury/illness/exacerbation are also affecting patient's functional outcome.   REHAB POTENTIAL: Good  CLINICAL DECISION MAKING: Evolving/moderate complexity  EVALUATION COMPLEXITY: Moderate   GOALS: Goals reviewed with patient? Yes  SHORT TERM GOALS: Target date: 06/02/2024  Pt will be independent with HEP to improve strength and decrease knee pain to improve pain-free function at home and work. Baseline: 03/15/2024: Initial HEP provided;04/19/2024: Goal status: Progressing    LONG TERM GOALS: Target date: 06/30/2024  Pt will decrease 5TSTS to < 11s seconds in order to demonstrate clinically significant improvement in LE strength  Baseline: 03/15/2024: 14.07s; 04/19/2024: 11.57s  Goal status: Progressing   2.  Pt will decrease worst knee pain below a 3/10 points on the NPRS in order to demonstrate clinically significant reduction in knee pain. Baseline: 03/15/2024: 8/10 NPS; 04/19/2024: 4-5/10 NPS Goal status: Progressing   3.  Pt will increase LEFS score > 50 points points in order demonstrate clinically significant reduction in knee pain/disability.       Baseline: 03/15/2024: 28/80 (80/80 = no disability); 04/21/2024: 46/80 (80/80 = no disability) Goal status: Progressing   4.  Pt will increase strength of Knee flexion/ext to a 4+ grade bilaterally in order to demonstrate improvement in strength and function  Baseline: 03/15/2024:  MMT (out of 5) R L 04/19/2024 R/L  Knee flexion 4- 4 4/4+  Knee extension 4- 4 5/5   Goal status: Progressing    PLAN: PT FREQUENCY: 1-2x/week  PT DURATION: 8 weeks  PLANNED  INTERVENTIONS: Therapeutic exercises, Therapeutic activity, Neuromuscular re-education, Balance training, Gait training, Patient/Family education, Self Care, Joint mobilization, Joint manipulation, Vestibular training, Canalith repositioning, Orthotic/Fit training, DME instructions, Dry Needling, Electrical stimulation, Spinal manipulation, Spinal mobilization, Cryotherapy, Moist heat, Taping, Traction, Ultrasound, Ionotophoresis 4mg /ml Dexamethasone, Manual therapy, and Re-evaluation.  PLAN FOR NEXT SESSION: Review HEP, Initiate Knee Strengthening, Hip Strengthening, Knee Flexion stretches, hamstring stretches   Lonni Pall PT, DPT Physical Therapist- [place]  05/05/2024, 3:34 PM  "

## 2024-05-10 ENCOUNTER — Ambulatory Visit: Attending: General Practice

## 2024-05-10 DIAGNOSIS — M25561 Pain in right knee: Secondary | ICD-10-CM | POA: Insufficient documentation

## 2024-05-10 DIAGNOSIS — G8929 Other chronic pain: Secondary | ICD-10-CM | POA: Diagnosis present

## 2024-05-10 DIAGNOSIS — M25661 Stiffness of right knee, not elsewhere classified: Secondary | ICD-10-CM | POA: Insufficient documentation

## 2024-05-10 DIAGNOSIS — M6281 Muscle weakness (generalized): Secondary | ICD-10-CM | POA: Insufficient documentation

## 2024-05-10 NOTE — Therapy (Addendum)
 " OUTPATIENT PHYSICAL THERAPY KNEE TREATMENT/RECERTIFICATION  Patient Name: Mercedes White MRN: [id number] DOB:1955/09/04, 69 y.o., female Today's Date: 05/10/2024  END OF SESSION:  PT End of Session - 05/10/24 1347     Visit Number 16    Number of Visits 20    Date for Recertification  05/24/24    PT Start Time 1347    PT Stop Time 1430    PT Time Calculation (min) 43 min    Activity Tolerance Patient tolerated treatment well    Behavior During Therapy [facility] for tasks assessed/performed          Past Medical History:  Diagnosis Date   Allergy    Arthritis    hands    Diverticulosis    Hx of adenomatous polyp of colon 02/17/2019   Migraines    with menses   Past Surgical History:  Procedure Laterality Date   BREAST BIOPSY Left 04/27/2024   u/s bc ribbon clip path pending   BREAST BIOPSY Left 04/27/2024   US  LT BREAST BX W LOC DEV 1ST LESION IMG BX SPEC US  GUIDE 04/27/2024 [facility]   CESAREAN SECTION     x1   COLONOSCOPY  2010   WISDOM TOOTH EXTRACTION     Patient Active Problem List   Diagnosis Date Noted   Establishing care with new doctor, encounter for 03/05/2024   Fall 03/05/2024   Acute pain of right knee 03/05/2024   Hyperlipidemia 03/05/2024   Finger pain, left 11/19/2022   Hx of adenomatous polyp of colon 02/17/2019   Prediabetes 12/02/2017   Osteoarthrosis, generalized, involving multiple sites 11/14/2015   Obesity (BMI 30-39.9) 11/01/2013   Routine general medical examination at a health care facility 11/01/2010   MENOPAUSAL SYNDROME 07/16/2010   Chronic seasonal allergic rhinitis due to pollen 01/14/2008   Premenstrual tension syndrome 01/13/2008    PCP: Vincente Shivers, NP  REFERRING PROVIDER: Vincente Shivers, NP  REFERRING DIAG:  [phone] (ICD-10-CM) - Acute pain of right knee      RATIONALE FOR EVALUATION AND TREATMENT: Rehabilitation  THERAPY DIAG: Muscle weakness (generalized)  Stiffness of right knee, not elsewhere  classified  Chronic pain of right knee  Acute pain of right knee  ONSET DATE: October 10th 2025  FOLLOW-UP APPT SCHEDULED WITH REFERRING PROVIDER: No    SUBJECTIVE:                                                                                                                                                                                         SUBJECTIVE STATEMENT:    Patient reports to OPPT with a chief concern of L knee pain.  PERTINENT HISTORY:   Patient reports a fall on Oct 10th; she reports that she tripped on the last step (going up), her shoes caught the last step and she fell and sprained her ankle. Patient reports that she has used tylenol and voltaren with minimal relief. She reports that's a reclined position relieves the pain however as soon as she transitions from reclined to seated the pain returns. Patient uses a recumbent elliptical in order to transition from the recliner. Patient reports that the pain prevents her from prolonged standing, sitting and she has to navigate stairs one step at a time.   Denies b/b, Saddle parasthesia, chills/fever, night sweats, nausea, vomiting,   Imaging:   EXAM: 4 VIEW(S) XRAY OF THE RIGHT KNEE 03/05/2024 09:58:31 AM   COMPARISON: None available.   CLINICAL HISTORY: acute right knee pain x 4 weeks after fall.   FINDINGS:   BONES AND JOINTS: No acute fracture. No focal osseous lesion. No joint dislocation. There is a small joint effusion. There is lateral patellofemoral compartment joint space narrowing compatible with moderate degenerative change. There are also mild degenerative changes at the lateral compartment of the knee with joint space narrowing and osteophyte formation.   SOFT TISSUES: The soft tissues are unremarkable.   IMPRESSION: 1. Small joint effusion. 2. No acute findings. 3. Moderate degenerative change of the knee.   Electronically signed by: Greig Pique MD 03/09/2024 11:31 PM EST  RP Workstation: [id number]  PAIN:   Pain Intensity: Present: 0/10, Best: /10, Worst: 8/10 Pain location: R knee  Pain quality: intermittent and sharp  Radiating pain: Yes  Swelling: Yes  Numbness/Tingling: No Focal weakness or buckling: Yes History of prior back, hip, or knee injury, pain, surgery, or therapy: No  PRECAUTIONS: Fall  WEIGHT BEARING RESTRICTIONS: No  FALLS: Has patient fallen in last 6 months? Yes. Number of falls 1  Living Environment Lives with: lives with their spouse Lives in: House/apartment Stairs: Internal 15 Step; Rail Support: Both   Prior level of function: Independent  Occupational demands: Retired   Hobbies: Taking Care of her dog  PATIENT GOALS: Patient would like to be able to walk without wobbling and get my knee stronger.    OBJECTIVE:   Patient Surveys  LEFS  Extreme difficulty/unable (0), Quite a bit of difficulty (1), Moderate difficulty (2), Little difficulty (3), No difficulty (4) Survey date:  03/15/2024  Any of your usual work, housework or school activities 3  2. Usual hobbies, recreational or sporting activities 2  3. Getting into/out of the bath 0  4. Walking between rooms 4  5. Putting on socks/shoes 3  6. Squatting  1  7. Lifting an object, like a bag of groceries from the floor 3  8. Performing light activities around your home 3  9. Performing heavy activities around your home 1  10. Getting into/out of a car 2  11. Walking 2 blocks 1  12. Walking 1 mile 0  13. Going up/down 10 stairs (1 flight) 3  14. Standing for 1 hour 2  15.  sitting for 1 hour 2  16. Running on even ground 0  17. Running on uneven ground 0  18. Making sharp turns while running fast 0  19. Hopping  0  20. Rolling over in bed 2  Score total:  28/80     Cognition WNL     Gross Musculoskeletal Assessment Tremor: None Bulk: Normal Tone: Normal  GAIT: Distance walked: 10 m Assistive device utilized: None Level  of assistance:  Complete Independence Comments: Decreased weight bearing on R, reciprocal gait pattern   Posture:  AROM AROM (Normal range in degrees) AROM  Hip Right Left  Flexion (125)    Extension (15)    Abduction (40)    Adduction     Internal Rotation (45)    External Rotation (45)        Knee    Flexion (135) 100 130  Extension (0) 0 0      Ankle    Dorsiflexion (20)    Plantarflexion (50)    Inversion (35)    Eversion (15    (* = pain; Blank rows = not tested)  LE MMT: MMT (out of 5) Right Left  Hip flexion 4- 4-  Hip extension    Hip abduction    Hip adduction    Hip internal rotation    Hip external rotation    Knee flexion 4- 4  Knee extension 4- 4  Ankle dorsiflexion 4+ 4+  Ankle plantarflexion 5 5  Ankle inversion    Ankle eversion    (* = pain; Blank rows = not tested)  Sensation Grossly intact to light touch bilateral LEs as determined by testing dermatomes L2-S2. Proprioception, and hot/cold testing deferred on this date.  Reflexes R/L Knee Jerk (L3/4): 2+/2+  Ankle Jerk (S1/2): 2+/2+   Muscle Length Hamstrings: R: Positive for tightness    Palpation Location LEFT  RIGHT           Quadriceps  1  Medial Hamstrings  0  Lateral Hamstrings  0  Lateral Hamstring tendon  0  Medial Hamstring tendon  0  Quadriceps tendon  0  Patella    Patellar Tendon  1  Tibial Tuberosity    Medial joint line  0  Lateral joint line  0  MCL  0  LCL  0  Adductor Tubercle    Pes Anserine tendon    Infrapatellar fat pad    Fibular head    Popliteal fossa    (Blank rows = not tested) Graded on 0-4 scale (0 = no pain, 1 = pain, 2 = pain with wincing/grimacing/flinching, 3 = pain with withdrawal, 4 = unwilling to allow palpation), (Blank rows = not tested)  Passive Accessory Motion Deferred  VASCULAR Deferred    SPECIAL TESTS  Ligamentous Stability  ACL: Lachman's: R: Negative  Anterior Drawer: R: Negative   MCL: Valgus Stress (30 degrees flexion): R:  Negative  LCL: Varus Stress (30 degrees flexion): R: Negative   Functional Tests:  : .75 m/s 5TSTS: 14.07s  TODAY'S TREATMENT: DATE: 05/10/2024  Subjective: Patient reports 0/10 in the R knee. She recently purchased gym equipment to optimize HEP No further questions or concerns.   Therapeutic Exercises : Hip Matrix   Hip Abduction   R/L: 3 x 10 - 40#  OMEGA Cable Machine:   Seated Knee Extension 2 x 10 - 20#    Supine Bridge - LE extended - Toe Raises  3 x 10 - progressively extending LE distance from torso   Passive Knee Flexion Stretch   R: 3 min in order to improve ROM//tissue extensibility  - able to achieve 115 deg - Supine    Therapeutic Activity (with intent to improve functional movements and increase LE strength for walking capacity) :  NuStep - Pace Partner mode - L6-3 x 6 min x LE Only (Seat 9) SPM > 100 for LE endurance and strength; PT manually adjusted resistance throughout per  patient's tolerance.   Lateral Lunge - SUE support   R/L: 2 x 10   Resisted Goblet Squats  3 x 10 - 15# DB   Lateral Stepping against resistance   4 x 12' Black TB around thigh  PATIENT EDUCATION:  Education details: Exercise Technique Person educated: Patient Education method: Explanation, Demonstration, and Handouts Education comprehension: verbalized understanding and returned demonstration   HOME EXERCISE PROGRAM:  Access Code: [email or web address] URL: [email or web address] Date: 04/26/2024 Prepared by: Lonni Pall  Exercises - Recumbent Bike  - 2 x daily - 7 x weekly - 1 sets - 10 min hold - Seated Piriformis Stretch  - 2 x daily - 7 x weekly - 3 sets - 15- 30s hold - Seated Hamstring Stretch  - 2 x daily - 7 x weekly - 3 sets - 15-30s hold - Seated Knee Flexion Stretch  - 2 x daily - 7 x weekly - 3 sets - 30s hold - Supine Heel Slide with Strap  - 2 x daily - 7 x weekly - 3 sets - 30s hold - Resisted Sit-to-Stand With Dumbbell at Chest  - 1 x daily -  3-4 x weekly - 2-3 sets - 10-12 reps - Forward Step Up  - 1 x daily - 3-4 x weekly - 2-3 sets - 10 reps - Side Stepping with Resistance at Thighs  - 1 x daily - 3-4 x weekly - 2-3 sets - 10-12 reps - Standing 3-Way Leg Reach with Resistance at Ankles and Unilateral Counter Support  - 1 x daily - 3-4 x weekly - 2-3 sets - 10 reps - Wall Quarter Squat with Foam Roller  - 1 x daily - 3-4 x weekly - 2-3 sets - 10 reps - Goblet Squat with Kettlebell  - 1 x daily - 3-4 x weekly - 2-3 sets - 10 reps - Deadlift With Dumbbells  - 1 x daily - 7 x weekly - 2-3 sets - 8-10 reps - Seated Ankle Eversion with Resistance  - 2 x daily - 3-4 x weekly - 2-3 sets - 10 reps - Toe Raise With Back Against Wall  - 1 x daily - 3-4 x weekly - 2-3 sets - 10 reps - Terminal Knee Extension with Ball and Counter  - 1 x daily - 3-4 x weekly - 2-3 sets - 10-12 reps - Supine Double Leg Bridge on Box  - 1 x daily - 3-4 x weekly - 2-3 sets - 10-12 reps  ASSESSMENT:  CLINICAL IMPRESSION: Continued PT POC in management of R knee pain. Patient's pain has significantly improved since start of PT POC. Patient reports that her functional movements such as stairs are still difficult due to knee pain. Additional exercises to address inner hip and inceased resistance with squatting technique. Good return demonstration with minimal cues for proper squatting form. Her range of motion continues to improve towards 120 deg without sharp pain, today she endorsed tightness in the patellar tendon and denied deep joint pain. PT POC remains appropriate and PT will continue focusing on increasing gluteal strength in order to address knee stability. PT requesting 2x/week for two more weeks to address remaining deficits. Patient's condition has the potential to improve in response to therapy. Maximum improvement is yet to be obtained. The anticipated improvement is attainable and reasonable in a generally predictable time. Based on today's session,  patient will continue to benefit from skilled PT in order to maximize return to PLOF and improve QoL.   OBJECTIVE IMPAIRMENTS:  Abnormal gait, cardiopulmonary status limiting activity, decreased endurance, difficulty walking, decreased ROM, decreased strength, and pain.   ACTIVITY LIMITATIONS: sitting, standing, squatting, stairs, and transfers  PARTICIPATION LIMITATIONS: community activity and yard work  PERSONAL FACTORS: Age, Past/current experiences, and Time since onset of injury/illness/exacerbation are also affecting patient's functional outcome.   REHAB POTENTIAL: Good  CLINICAL DECISION MAKING: Evolving/moderate complexity  EVALUATION COMPLEXITY: Moderate   GOALS: Goals reviewed with patient? Yes  SHORT TERM GOALS: Target date: 06/07/2024  Pt will be independent with HEP to improve strength and decrease knee pain to improve pain-free function at home and work. Baseline: 03/15/2024: Initial HEP provided;04/19/2024: adherent 100% Goal status: Progressing    LONG TERM GOALS: Target date: 07/05/2024  Pt will decrease 5TSTS to < 11s seconds in order to demonstrate clinically significant improvement in LE strength  Baseline: 03/15/2024: 14.07s; 04/19/2024: 11.57s  Goal status: Progressing   2.  Pt will decrease worst knee pain below a 3/10 points on the NPRS in order to demonstrate clinically significant reduction in knee pain. Baseline: 03/15/2024: 8/10 NPS; 04/19/2024: 4-5/10 NPS; 05/11/2023:3/10 Goal status: Progressing   3.  Pt will increase LEFS score > 50 points points in order demonstrate clinically significant reduction in knee pain/disability.       Baseline: 03/15/2024: 28/80 (80/80 = no disability); 04/21/2024: 46/80 (80/80 = no disability) Goal status: Progressing   4.  Pt will increase strength of Knee flexion/ext to a 4+ grade bilaterally in order to demonstrate improvement in strength and function  Baseline: 03/15/2024:  MMT (out of 5) R L 04/19/2024 R/L  Knee  flexion 4- 4 4/4+  Knee extension 4- 4 5/5   Goal status: Progressing    PLAN: PT FREQUENCY: 1-2x/week  PT DURATION: 8 weeks  PLANNED INTERVENTIONS: Therapeutic exercises, Therapeutic activity, Neuromuscular re-education, Balance training, Gait training, Patient/Family education, Self Care, Joint mobilization, Joint manipulation, Vestibular training, Canalith repositioning, Orthotic/Fit training, DME instructions, Dry Needling, Electrical stimulation, Spinal manipulation, Spinal mobilization, Cryotherapy, Moist heat, Taping, Traction, Ultrasound, Ionotophoresis 4mg /ml Dexamethasone, Manual therapy, and Re-evaluation.  PLAN FOR NEXT SESSION: Review HEP, Progressing Knee Strengthening, Hip Strengthening, Knee Flexion stretches, hamstring stretches   Lonni Pall PT, DPT Physical Therapist-   05/10/2024, 1:53 PM  "

## 2024-05-10 NOTE — Addendum Note (Signed)
 Addended by: KRISTA LONNI PARAS on: 05/10/2024 03:14 PM   Modules accepted: Orders

## 2024-05-12 ENCOUNTER — Ambulatory Visit

## 2024-05-12 DIAGNOSIS — M6281 Muscle weakness (generalized): Secondary | ICD-10-CM

## 2024-05-12 DIAGNOSIS — M25661 Stiffness of right knee, not elsewhere classified: Secondary | ICD-10-CM

## 2024-05-12 DIAGNOSIS — G8929 Other chronic pain: Secondary | ICD-10-CM

## 2024-05-12 NOTE — Therapy (Signed)
 " OUTPATIENT PHYSICAL THERAPY KNEE TREATMENT  Patient Name: Mercedes White MRN: [id number] DOB:1956/04/14, 69 y.o., female Today's Date: 05/12/2024  END OF SESSION:  PT End of Session - 05/12/24 1428     Visit Number 17    Number of Visits 20    Date for Recertification  05/24/24    PT Start Time 1428    PT Stop Time 1510    PT Time Calculation (min) 42 min    Activity Tolerance Patient tolerated treatment well    Behavior During Therapy [facility] for tasks assessed/performed          Past Medical History:  Diagnosis Date   Allergy    Arthritis    hands    Diverticulosis    Hx of adenomatous polyp of colon 02/17/2019   Migraines    with menses   Past Surgical History:  Procedure Laterality Date   BREAST BIOPSY Left 04/27/2024   u/s bc ribbon clip path pending   BREAST BIOPSY Left 04/27/2024   US  LT BREAST BX W LOC DEV 1ST LESION IMG BX SPEC US  GUIDE 04/27/2024 [facility]   CESAREAN SECTION     x1   COLONOSCOPY  2010   WISDOM TOOTH EXTRACTION     Patient Active Problem List   Diagnosis Date Noted   Establishing care with new doctor, encounter for 03/05/2024   Fall 03/05/2024   Acute pain of right knee 03/05/2024   Hyperlipidemia 03/05/2024   Finger pain, left 11/19/2022   Hx of adenomatous polyp of colon 02/17/2019   Prediabetes 12/02/2017   Osteoarthrosis, generalized, involving multiple sites 11/14/2015   Obesity (BMI 30-39.9) 11/01/2013   Routine general medical examination at a health care facility 11/01/2010   MENOPAUSAL SYNDROME 07/16/2010   Chronic seasonal allergic rhinitis due to pollen 01/14/2008   Premenstrual tension syndrome 01/13/2008    PCP: Vincente Shivers, NP  REFERRING PROVIDER: Vincente Shivers, NP  REFERRING DIAG:  [phone] (ICD-10-CM) - Acute pain of right knee      RATIONALE FOR EVALUATION AND TREATMENT: Rehabilitation  THERAPY DIAG: Muscle weakness (generalized)  Stiffness of right knee, not elsewhere classified  Chronic pain  of right knee  ONSET DATE: October 10th 2025  FOLLOW-UP APPT SCHEDULED WITH REFERRING PROVIDER: No    SUBJECTIVE:                                                                                                                                                                                         SUBJECTIVE STATEMENT:    Patient reports to OPPT with a chief concern of L knee pain.   PERTINENT HISTORY:   Patient reports  a fall on Oct 10th; she reports that she tripped on the last step (going up), her shoes caught the last step and she fell and sprained her ankle. Patient reports that she has used tylenol and voltaren with minimal relief. She reports that's a reclined position relieves the pain however as soon as she transitions from reclined to seated the pain returns. Patient uses a recumbent elliptical in order to transition from the recliner. Patient reports that the pain prevents her from prolonged standing, sitting and she has to navigate stairs one step at a time.   Denies b/b, Saddle parasthesia, chills/fever, night sweats, nausea, vomiting,   Imaging:   EXAM: 4 VIEW(S) XRAY OF THE RIGHT KNEE 03/05/2024 09:58:31 AM   COMPARISON: None available.   CLINICAL HISTORY: acute right knee pain x 4 weeks after fall.   FINDINGS:   BONES AND JOINTS: No acute fracture. No focal osseous lesion. No joint dislocation. There is a small joint effusion. There is lateral patellofemoral compartment joint space narrowing compatible with moderate degenerative change. There are also mild degenerative changes at the lateral compartment of the knee with joint space narrowing and osteophyte formation.   SOFT TISSUES: The soft tissues are unremarkable.   IMPRESSION: 1. Small joint effusion. 2. No acute findings. 3. Moderate degenerative change of the knee.   Electronically signed by: Greig Pique MD 03/09/2024 11:31 PM EST RP Workstation: [id number]  PAIN:   Pain Intensity: Present:  0/10, Best: /10, Worst: 8/10 Pain location: R knee  Pain quality: intermittent and sharp  Radiating pain: Yes  Swelling: Yes  Numbness/Tingling: No Focal weakness or buckling: Yes History of prior back, hip, or knee injury, pain, surgery, or therapy: No  PRECAUTIONS: Fall  WEIGHT BEARING RESTRICTIONS: No  FALLS: Has patient fallen in last 6 months? Yes. Number of falls 1  Living Environment Lives with: lives with their spouse Lives in: House/apartment Stairs: Internal 15 Step; Rail Support: Both   Prior level of function: Independent  Occupational demands: Retired   Hobbies: Taking Care of her dog  PATIENT GOALS: Patient would like to be able to walk without wobbling and get my knee stronger.    OBJECTIVE:   Patient Surveys  LEFS  Extreme difficulty/unable (0), Quite a bit of difficulty (1), Moderate difficulty (2), Little difficulty (3), No difficulty (4) Survey date:  03/15/2024  Any of your usual work, housework or school activities 3  2. Usual hobbies, recreational or sporting activities 2  3. Getting into/out of the bath 0  4. Walking between rooms 4  5. Putting on socks/shoes 3  6. Squatting  1  7. Lifting an object, like a bag of groceries from the floor 3  8. Performing light activities around your home 3  9. Performing heavy activities around your home 1  10. Getting into/out of a car 2  11. Walking 2 blocks 1  12. Walking 1 mile 0  13. Going up/down 10 stairs (1 flight) 3  14. Standing for 1 hour 2  15.  sitting for 1 hour 2  16. Running on even ground 0  17. Running on uneven ground 0  18. Making sharp turns while running fast 0  19. Hopping  0  20. Rolling over in bed 2  Score total:  28/80     Cognition WNL     Gross Musculoskeletal Assessment Tremor: None Bulk: Normal Tone: Normal  GAIT: Distance walked: 10 m Assistive device utilized: None Level of assistance: Complete Independence Comments: Decreased  weight bearing on R,  reciprocal gait pattern   Posture:  AROM AROM (Normal range in degrees) AROM  Hip Right Left  Flexion (125)    Extension (15)    Abduction (40)    Adduction     Internal Rotation (45)    External Rotation (45)        Knee    Flexion (135) 100 130  Extension (0) 0 0      Ankle    Dorsiflexion (20)    Plantarflexion (50)    Inversion (35)    Eversion (15    (* = pain; Blank rows = not tested)  LE MMT: MMT (out of 5) Right Left  Hip flexion 4- 4-  Hip extension    Hip abduction    Hip adduction    Hip internal rotation    Hip external rotation    Knee flexion 4- 4  Knee extension 4- 4  Ankle dorsiflexion 4+ 4+  Ankle plantarflexion 5 5  Ankle inversion    Ankle eversion    (* = pain; Blank rows = not tested)  Sensation Grossly intact to light touch bilateral LEs as determined by testing dermatomes L2-S2. Proprioception, and hot/cold testing deferred on this date.  Reflexes R/L Knee Jerk (L3/4): 2+/2+  Ankle Jerk (S1/2): 2+/2+   Muscle Length Hamstrings: R: Positive for tightness    Palpation Location LEFT  RIGHT           Quadriceps  1  Medial Hamstrings  0  Lateral Hamstrings  0  Lateral Hamstring tendon  0  Medial Hamstring tendon  0  Quadriceps tendon  0  Patella    Patellar Tendon  1  Tibial Tuberosity    Medial joint line  0  Lateral joint line  0  MCL  0  LCL  0  Adductor Tubercle    Pes Anserine tendon    Infrapatellar fat pad    Fibular head    Popliteal fossa    (Blank rows = not tested) Graded on 0-4 scale (0 = no pain, 1 = pain, 2 = pain with wincing/grimacing/flinching, 3 = pain with withdrawal, 4 = unwilling to allow palpation), (Blank rows = not tested)  Passive Accessory Motion Deferred  VASCULAR Deferred    SPECIAL TESTS  Ligamentous Stability  ACL: Lachman's: R: Negative  Anterior Drawer: R: Negative   MCL: Valgus Stress (30 degrees flexion): R: Negative  LCL: Varus Stress (30 degrees flexion): R: Negative    Functional Tests:  : .75 m/s 5TSTS: 14.07s  TODAY'S TREATMENT: DATE: 05/12/2024  Subjective: Patient reports 4/10 NPS in the R knee. She brought exercise loops in order to confirm if they are proper for HEP. No further questions or concerns.   Therapeutic Exercises : OMEGA Cable Machine:   Seated Knee Extension 2 x 10 - # 25 1 x 10 - # 15   Seated Knee Flexion  2 x 10 - 25#    Wall Sits Isometrics  3 x 10s   LAQ Isometrics  R: 3 x 10s   Hip Matrix   Hip Abduction   R/L: 3 x 10 - 40#  Passive Knee Flexion Stretch   R: 3 min in order to improve ROM//tissue extensibility     Therapeutic Activity (with intent to improve functional movements and increase LE strength for walking capacity) :  NuStep - Pace Partner mode - L6-1 x 6 min x LE Only (Seat 9) SPM > 100 for LE endurance  and strength for walking; PT manually adjusted resistance throughout per patient's tolerance.   Lateral Lunge - SUE support - LE on first stair step   R/L: 2 x 10   Lateral Stepping against resistance   4 x 20' ([place] Resistance Band - Medium 25- 35Lb) around thigh   4 x 20' ([place] Resistance Band - Medium 25- 35Lb) around thigh   PATIENT EDUCATION:  Education details: Exercise Technique Person educated: Patient Education method: Explanation, Demonstration, and Handouts Education comprehension: verbalized understanding and returned demonstration   HOME EXERCISE PROGRAM:  Access Code: [email or web address] URL: [email or web address] Date: 04/26/2024 Prepared by: Lonni Pall  Exercises - Recumbent Bike  - 2 x daily - 7 x weekly - 1 sets - 10 min hold - Seated Piriformis Stretch  - 2 x daily - 7 x weekly - 3 sets - 15- 30s hold - Seated Hamstring Stretch  - 2 x daily - 7 x weekly - 3 sets - 15-30s hold - Seated Knee Flexion Stretch  - 2 x daily - 7 x weekly - 3 sets - 30s hold - Supine Heel Slide with Strap  - 2 x daily - 7 x weekly - 3 sets - 30s hold - Resisted Sit-to-Stand  With Dumbbell at Chest  - 1 x daily - 3-4 x weekly - 2-3 sets - 10-12 reps - Forward Step Up  - 1 x daily - 3-4 x weekly - 2-3 sets - 10 reps - Side Stepping with Resistance at Thighs  - 1 x daily - 3-4 x weekly - 2-3 sets - 10-12 reps - Standing 3-Way Leg Reach with Resistance at Ankles and Unilateral Counter Support  - 1 x daily - 3-4 x weekly - 2-3 sets - 10 reps - Wall Quarter Squat with Foam Roller  - 1 x daily - 3-4 x weekly - 2-3 sets - 10 reps - Goblet Squat with Kettlebell  - 1 x daily - 3-4 x weekly - 2-3 sets - 10 reps - Deadlift With Dumbbells  - 1 x daily - 7 x weekly - 2-3 sets - 8-10 reps - Seated Ankle Eversion with Resistance  - 2 x daily - 3-4 x weekly - 2-3 sets - 10 reps - Toe Raise With Back Against Wall  - 1 x daily - 3-4 x weekly - 2-3 sets - 10 reps - Terminal Knee Extension with Ball and Counter  - 1 x daily - 3-4 x weekly - 2-3 sets - 10-12 reps - Supine Double Leg Bridge on Box  - 1 x daily - 3-4 x weekly - 2-3 sets - 10-12 reps  ASSESSMENT:  CLINICAL IMPRESSION: Continued PT POC in management of R knee pain. Addressed subjective report of pain with isometric holds to the patellar tendon. Improved pain in the right knee following PT interventions. Unable to maintain  wall sit due to patellar fememoral approximation and sensation of grinding behind the knee cap. Patient continues to be motivated to improve strength. Good return demonstration of lateral lunges for hip mobility and knee strength. Updated HEP to include additional exercises. Based on today's session, patient will continue to benefit from skilled PT in order to maximize return to PLOF and improve QoL.   OBJECTIVE IMPAIRMENTS: Abnormal gait, cardiopulmonary status limiting activity, decreased endurance, difficulty walking, decreased ROM, decreased strength, and pain.   ACTIVITY LIMITATIONS: sitting, standing, squatting, stairs, and transfers  PARTICIPATION LIMITATIONS: community activity and yard  work  PERSONAL FACTORS: Age, Past/current experiences, and Time since onset  of injury/illness/exacerbation are also affecting patient's functional outcome.   REHAB POTENTIAL: Good  CLINICAL DECISION MAKING: Evolving/moderate complexity  EVALUATION COMPLEXITY: Moderate   GOALS: Goals reviewed with patient? Yes  SHORT TERM GOALS: Target date: 06/09/2024  Pt will be independent with HEP to improve strength and decrease knee pain to improve pain-free function at home and work. Baseline: 03/15/2024: Initial HEP provided;04/19/2024: adherent 100% Goal status: Progressing    LONG TERM GOALS: Target date: 07/07/2024  Pt will decrease 5TSTS to < 11s seconds in order to demonstrate clinically significant improvement in LE strength  Baseline: 03/15/2024: 14.07s; 04/19/2024: 11.57s  Goal status: Progressing   2.  Pt will decrease worst knee pain below a 3/10 points on the NPRS in order to demonstrate clinically significant reduction in knee pain. Baseline: 03/15/2024: 8/10 NPS; 04/19/2024: 4-5/10 NPS; 05/11/2023:3/10 Goal status: Progressing   3.  Pt will increase LEFS score > 50 points points in order demonstrate clinically significant reduction in knee pain/disability.       Baseline: 03/15/2024: 28/80 (80/80 = no disability); 04/21/2024: 46/80 (80/80 = no disability) Goal status: Progressing   4.  Pt will increase strength of Knee flexion/ext to a 4+ grade bilaterally in order to demonstrate improvement in strength and function  Baseline: 03/15/2024:  MMT (out of 5) R L 04/19/2024 R/L  Knee flexion 4- 4 4/4+  Knee extension 4- 4 5/5   Goal status: Progressing    PLAN: PT FREQUENCY: 1-2x/week  PT DURATION: 8 weeks  PLANNED INTERVENTIONS: Therapeutic exercises, Therapeutic activity, Neuromuscular re-education, Balance training, Gait training, Patient/Family education, Self Care, Joint mobilization, Joint manipulation, Vestibular training, Canalith repositioning, Orthotic/Fit  training, DME instructions, Dry Needling, Electrical stimulation, Spinal manipulation, Spinal mobilization, Cryotherapy, Moist heat, Taping, Traction, Ultrasound, Ionotophoresis 4mg /ml Dexamethasone, Manual therapy, and Re-evaluation.  PLAN FOR NEXT SESSION: Review HEP, Progressing Knee Strengthening, Hip Strengthening, Knee Flexion stretches, hamstring stretches   Lonni Pall PT, DPT Physical Therapist- [place]  05/12/2024, 2:35 PM  "

## 2024-05-17 ENCOUNTER — Ambulatory Visit

## 2024-05-17 DIAGNOSIS — M25661 Stiffness of right knee, not elsewhere classified: Secondary | ICD-10-CM

## 2024-05-17 DIAGNOSIS — M6281 Muscle weakness (generalized): Secondary | ICD-10-CM

## 2024-05-17 DIAGNOSIS — G8929 Other chronic pain: Secondary | ICD-10-CM

## 2024-05-17 NOTE — Therapy (Signed)
 " OUTPATIENT PHYSICAL THERAPY KNEE TREATMENT  Patient Name: Mercedes White MRN: [id number] DOB:Jun 24, 1955, 69 y.o., female Today's Date: 05/17/2024  END OF SESSION:  PT End of Session - 05/17/24 1300     Visit Number 18    Number of Visits 20    Date for Recertification  05/24/24    PT Start Time 1300    PT Stop Time 1340    PT Time Calculation (min) 40 min    Activity Tolerance Patient tolerated treatment well    Behavior During Therapy [facility] for tasks assessed/performed          Past Medical History:  Diagnosis Date   Allergy    Arthritis    hands    Diverticulosis    Hx of adenomatous polyp of colon 02/17/2019   Migraines    with menses   Past Surgical History:  Procedure Laterality Date   BREAST BIOPSY Left 04/27/2024   u/s bc ribbon clip path pending   BREAST BIOPSY Left 04/27/2024   US  LT BREAST BX W LOC DEV 1ST LESION IMG BX SPEC US  GUIDE 04/27/2024 [facility]   CESAREAN SECTION     x1   COLONOSCOPY  2010   WISDOM TOOTH EXTRACTION     Patient Active Problem List   Diagnosis Date Noted   Establishing care with new doctor, encounter for 03/05/2024   Fall 03/05/2024   Acute pain of right knee 03/05/2024   Hyperlipidemia 03/05/2024   Finger pain, left 11/19/2022   Hx of adenomatous polyp of colon 02/17/2019   Prediabetes 12/02/2017   Osteoarthrosis, generalized, involving multiple sites 11/14/2015   Obesity (BMI 30-39.9) 11/01/2013   Routine general medical examination at a health care facility 11/01/2010   MENOPAUSAL SYNDROME 07/16/2010   Chronic seasonal allergic rhinitis due to pollen 01/14/2008   Premenstrual tension syndrome 01/13/2008    PCP: Vincente Shivers, NP  REFERRING PROVIDER: Vincente Shivers, NP  REFERRING DIAG:  [phone] (ICD-10-CM) - Acute pain of right knee      RATIONALE FOR EVALUATION AND TREATMENT: Rehabilitation  THERAPY DIAG: Stiffness of right knee, not elsewhere classified  Chronic pain of right knee  Muscle  weakness (generalized)  ONSET DATE: October 10th 2025  FOLLOW-UP APPT SCHEDULED WITH REFERRING PROVIDER: No    SUBJECTIVE:                                                                                                                                                                                         SUBJECTIVE STATEMENT:    Patient reports to OPPT with a chief concern of L knee pain.   PERTINENT HISTORY:   Patient reports  a fall on Oct 10th; she reports that she tripped on the last step (going up), her shoes caught the last step and she fell and sprained her ankle. Patient reports that she has used tylenol and voltaren with minimal relief. She reports that's a reclined position relieves the pain however as soon as she transitions from reclined to seated the pain returns. Patient uses a recumbent elliptical in order to transition from the recliner. Patient reports that the pain prevents her from prolonged standing, sitting and she has to navigate stairs one step at a time.   Denies b/b, Saddle parasthesia, chills/fever, night sweats, nausea, vomiting,   Imaging:   EXAM: 4 VIEW(S) XRAY OF THE RIGHT KNEE 03/05/2024 09:58:31 AM   COMPARISON: None available.   CLINICAL HISTORY: acute right knee pain x 4 weeks after fall.   FINDINGS:   BONES AND JOINTS: No acute fracture. No focal osseous lesion. No joint dislocation. There is a small joint effusion. There is lateral patellofemoral compartment joint space narrowing compatible with moderate degenerative change. There are also mild degenerative changes at the lateral compartment of the knee with joint space narrowing and osteophyte formation.   SOFT TISSUES: The soft tissues are unremarkable.   IMPRESSION: 1. Small joint effusion. 2. No acute findings. 3. Moderate degenerative change of the knee.   Electronically signed by: Greig Pique MD 03/09/2024 11:31 PM EST RP Workstation: [id number]  PAIN:   Pain Intensity:  Present: 0/10, Best: /10, Worst: 8/10 Pain location: R knee  Pain quality: intermittent and sharp  Radiating pain: Yes  Swelling: Yes  Numbness/Tingling: No Focal weakness or buckling: Yes History of prior back, hip, or knee injury, pain, surgery, or therapy: No  PRECAUTIONS: Fall  WEIGHT BEARING RESTRICTIONS: No  FALLS: Has patient fallen in last 6 months? Yes. Number of falls 1  Living Environment Lives with: lives with their spouse Lives in: House/apartment Stairs: Internal 15 Step; Rail Support: Both   Prior level of function: Independent  Occupational demands: Retired   Hobbies: Taking Care of her dog  PATIENT GOALS: Patient would like to be able to walk without wobbling and get my knee stronger.    OBJECTIVE:   Patient Surveys  LEFS  Extreme difficulty/unable (0), Quite a bit of difficulty (1), Moderate difficulty (2), Little difficulty (3), No difficulty (4) Survey date:  03/15/2024  Any of your usual work, housework or school activities 3  2. Usual hobbies, recreational or sporting activities 2  3. Getting into/out of the bath 0  4. Walking between rooms 4  5. Putting on socks/shoes 3  6. Squatting  1  7. Lifting an object, like a bag of groceries from the floor 3  8. Performing light activities around your home 3  9. Performing heavy activities around your home 1  10. Getting into/out of a car 2  11. Walking 2 blocks 1  12. Walking 1 mile 0  13. Going up/down 10 stairs (1 flight) 3  14. Standing for 1 hour 2  15.  sitting for 1 hour 2  16. Running on even ground 0  17. Running on uneven ground 0  18. Making sharp turns while running fast 0  19. Hopping  0  20. Rolling over in bed 2  Score total:  28/80     Cognition WNL     Gross Musculoskeletal Assessment Tremor: None Bulk: Normal Tone: Normal  GAIT: Distance walked: 10 m Assistive device utilized: None Level of assistance: Complete Independence Comments: Decreased  weight bearing on R,  reciprocal gait pattern   Posture:  AROM AROM (Normal range in degrees) AROM  Hip Right Left  Flexion (125)    Extension (15)    Abduction (40)    Adduction     Internal Rotation (45)    External Rotation (45)        Knee    Flexion (135) 100 130  Extension (0) 0 0      Ankle    Dorsiflexion (20)    Plantarflexion (50)    Inversion (35)    Eversion (15    (* = pain; Blank rows = not tested)  LE MMT: MMT (out of 5) Right Left  Hip flexion 4- 4-  Hip extension    Hip abduction    Hip adduction    Hip internal rotation    Hip external rotation    Knee flexion 4- 4  Knee extension 4- 4  Ankle dorsiflexion 4+ 4+  Ankle plantarflexion 5 5  Ankle inversion    Ankle eversion    (* = pain; Blank rows = not tested)  Sensation Grossly intact to light touch bilateral LEs as determined by testing dermatomes L2-S2. Proprioception, and hot/cold testing deferred on this date.  Reflexes R/L Knee Jerk (L3/4): 2+/2+  Ankle Jerk (S1/2): 2+/2+   Muscle Length Hamstrings: R: Positive for tightness    Palpation Location LEFT  RIGHT           Quadriceps  1  Medial Hamstrings  0  Lateral Hamstrings  0  Lateral Hamstring tendon  0  Medial Hamstring tendon  0  Quadriceps tendon  0  Patella    Patellar Tendon  1  Tibial Tuberosity    Medial joint line  0  Lateral joint line  0  MCL  0  LCL  0  Adductor Tubercle    Pes Anserine tendon    Infrapatellar fat pad    Fibular head    Popliteal fossa    (Blank rows = not tested) Graded on 0-4 scale (0 = no pain, 1 = pain, 2 = pain with wincing/grimacing/flinching, 3 = pain with withdrawal, 4 = unwilling to allow palpation), (Blank rows = not tested)  Passive Accessory Motion Deferred  VASCULAR Deferred    SPECIAL TESTS  Ligamentous Stability  ACL: Lachman's: R: Negative  Anterior Drawer: R: Negative   MCL: Valgus Stress (30 degrees flexion): R: Negative  LCL: Varus Stress (30 degrees flexion): R: Negative    Functional Tests:  : .75 m/s 5TSTS: 14.07s  TODAY'S TREATMENT: DATE: 05/17/2024  Subjective: Patient reports 2-3/10 NPS in the R knee; contributes the pain and stiffness to the cold. Patient reports that her R hip also feels stiff and aching prior to start of session  No further questions or concerns.  Therapeutic Exercises : Hip Matrix   Hip Abduction   R/L: 3 x 10 - 40#  OMEGA Cable Machine:   Seated Knee Extension 2 x 10 - # 20 - minor pulling inferior to patella  1 x 10 - # 15   Seated Knee Flexion  2 x 10 - 25#    Supine Glute Stretch  30s/bout x 2 in order to improve muscle tension/tissue extensibility  Supine Piriformis Stretch   30s/bout x 2 in order to improve muscle tension/tissue extensibility  Supine Single knee to chest  30s/bout x 2 in order to improve muscle tension/tissue extensibility   Therapeutic Activity (with intent to improve functional movements and  increase LE strength for walking capacity) :  NuStep - Pace Partner mode - L6-1 x 6 min x LE Only (Seat 9) SPM > 100 for LE endurance and strength for walking; PT manually adjusted resistance throughout per patient's tolerance.   Resisted TRX Squat - Blue TB around lower leg   3 x 10   Single Leg RDL - SUE support   R/L: 1 x 10 - AROM   R/L: 1 x 10 - 5# DB  R/L: 1 x 10 - 10# DB  Lateral Stepping against resistance    4 x 12' - Blue TB around ankle   4 x 12' - Blue TB around ankle  4 x 12' - Blue TB around ankle   PATIENT EDUCATION:  Education details: Exercise Technique Person educated: Patient Education method: Explanation, Demonstration, and Handouts Education comprehension: verbalized understanding and returned demonstration   HOME EXERCISE PROGRAM:  Access Code: [email or web address] URL: [email or web address] Date: 05/17/2024 Prepared by: Lonni Pall  Exercises - Supine Gluteus Stretch  - 2 x daily - 7 x weekly - 3 sets - 30s hold - Supine Piriformis Stretch with  Foot on Ground  - 2 x daily - 7 x weekly - 3 sets - 30s hold - Supine Single Knee to Chest Stretch  - 2 x daily - 7 x weekly - 3 sets - 30s hold  Access Code: [email or web address] URL: [email or web address] Date: 05/17/2024 Prepared by: Lonni Pall  Exercises - Recumbent Bike  - 2 x daily - 7 x weekly - 1 sets - 10 min hold - Seated Piriformis Stretch  - 2 x daily - 7 x weekly - 3 sets - 15- 30s hold - Seated Hamstring Stretch  - 2 x daily - 7 x weekly - 3 sets - 15-30s hold - Seated Knee Flexion Stretch  - 2 x daily - 7 x weekly - 3 sets - 30s hold - Supine Heel Slide with Strap  - 2 x daily - 7 x weekly - 3 sets - 30s hold - Resisted Sit-to-Stand With Dumbbell at Chest  - 1 x daily - 3-4 x weekly - 2-3 sets - 10-12 reps - Forward Step Up  - 1 x daily - 3-4 x weekly - 2-3 sets - 10 reps - Side Stepping with Resistance at Thighs  - 1 x daily - 3-4 x weekly - 2-3 sets - 10-12 reps - Standing 3-Way Leg Reach with Resistance at Ankles and Unilateral Counter Support  - 1 x daily - 3-4 x weekly - 2-3 sets - 10 reps - Wall Quarter Squat with Foam Roller  - 1 x daily - 3-4 x weekly - 2-3 sets - 10 reps - Goblet Squat with Kettlebell  - 1 x daily - 3-4 x weekly - 2-3 sets - 10 reps - Deadlift With Dumbbells  - 1 x daily - 7 x weekly - 2-3 sets - 8-10 reps - Seated Ankle Eversion with Resistance  - 2 x daily - 3-4 x weekly - 2-3 sets - 10 reps - Toe Raise With Back Against Wall  - 1 x daily - 3-4 x weekly - 2-3 sets - 10 reps - Terminal Knee Extension with Ball and Counter  - 1 x daily - 3-4 x weekly - 2-3 sets - 10-12 reps - Supine Double Leg Bridge on Box  - 1 x daily - 3-4 x weekly - 2-3 sets - 10-12 reps - Single-Leg Romanian Deadlift With Dumbbell  - 1 x daily - 3-4  x weekly - 2-3 sets - 10 reps - 10# dumbbell  - Lateral Lunge with Foot Turned Out  - 1 x daily - 3-4 x weekly - 2-3 sets - 10 reps  ASSESSMENT:  CLINICAL IMPRESSION: Continued PT POC in management of R knee pain. Session  focused on gluteal strengthening due to subjective pain and to improve knee stability. Patient tolerated all interventions and successfully performed RDL without pain in BLE. Patient demonstrates complete independence with all exercises and her pain has responded well with PT interventions. She still has intermittent pain in specific ranges of knee flexion (90 deg) which limits her full participation with recreational activities. PT to provide comprehensive HEP in following appt to prepare for discharge.   OBJECTIVE IMPAIRMENTS: Abnormal gait, cardiopulmonary status limiting activity, decreased endurance, difficulty walking, decreased ROM, decreased strength, and pain.   ACTIVITY LIMITATIONS: sitting, standing, squatting, stairs, and transfers  PARTICIPATION LIMITATIONS: community activity and yard work  PERSONAL FACTORS: Age, Past/current experiences, and Time since onset of injury/illness/exacerbation are also affecting patient's functional outcome.   REHAB POTENTIAL: Good  CLINICAL DECISION MAKING: Evolving/moderate complexity  EVALUATION COMPLEXITY: Moderate   GOALS: Goals reviewed with patient? Yes  SHORT TERM GOALS: Target date: 04/12/2024   Pt will be independent with HEP to improve strength and decrease knee pain to improve pain-free function at home and work. Baseline: 03/15/2024: Initial HEP provided;04/19/2024: adherent 100% Goal status: Goal Met     LONG TERM GOALS: Target date: 05/24/2024  Pt will decrease 5TSTS to < 11s seconds in order to demonstrate clinically significant improvement in LE strength  Baseline: 03/15/2024: 14.07s; 04/19/2024: 11.57s  Goal status: Progressing   2.  Pt will decrease worst knee pain below a 3/10 points on the NPRS in order to demonstrate clinically significant reduction in knee pain. Baseline: 03/15/2024: 8/10 NPS; 04/19/2024: 4-5/10 NPS; 05/11/2023:3/10 Goal status: Progressing   3.  Pt will increase LEFS score > 50 points points in  order demonstrate clinically significant reduction in knee pain/disability.       Baseline: 03/15/2024: 28/80 (80/80 = no disability); 04/21/2024: 46/80 (80/80 = no disability) Goal status: Progressing   4.  Pt will increase strength of Knee flexion/ext to a 4+ grade bilaterally in order to demonstrate improvement in strength and function  Baseline: 03/15/2024:  MMT (out of 5) R L 04/19/2024 R/L  Knee flexion 4- 4 4/4+  Knee extension 4- 4 5/5   Goal status: Progressing    PLAN: PT FREQUENCY: 1-2x/week  PT DURATION: 8 weeks  PLANNED INTERVENTIONS: Therapeutic exercises, Therapeutic activity, Neuromuscular re-education, Balance training, Gait training, Patient/Family education, Self Care, Joint mobilization, Joint manipulation, Vestibular training, Canalith repositioning, Orthotic/Fit training, DME instructions, Dry Needling, Electrical stimulation, Spinal manipulation, Spinal mobilization, Cryotherapy, Moist heat, Taping, Traction, Ultrasound, Ionotophoresis 4mg /ml Dexamethasone, Manual therapy, and Re-evaluation.  PLAN FOR NEXT SESSION: Review HEP, Progressing Knee Strengthening, Hip Strengthening, Knee Flexion stretches, hamstring stretches   Lonni Pall PT, DPT Physical Therapist- [place]  05/17/2024, 1:02 PM  "

## 2024-05-20 ENCOUNTER — Ambulatory Visit

## 2024-05-20 DIAGNOSIS — M25661 Stiffness of right knee, not elsewhere classified: Secondary | ICD-10-CM

## 2024-05-20 DIAGNOSIS — G8929 Other chronic pain: Secondary | ICD-10-CM

## 2024-05-20 DIAGNOSIS — M6281 Muscle weakness (generalized): Secondary | ICD-10-CM | POA: Diagnosis not present

## 2024-05-20 NOTE — Therapy (Signed)
 " OUTPATIENT PHYSICAL THERAPY KNEE TREATMENT/DISCHARGE SUMMARY   Patient Name: Mercedes White MRN: [id number] DOB:[birth year], 69 y.o., female Today's Date: 05/20/2024  END OF SESSION:  PT End of Session - 05/20/24 1302     Visit Number 19    Number of Visits 20    Date for Recertification  05/24/24    PT Start Time 1300    PT Stop Time 1345    PT Time Calculation (min) 45 min    Activity Tolerance Patient tolerated treatment well    Behavior During Therapy [facility] for tasks assessed/performed          Past Medical History:  Diagnosis Date   Allergy    Arthritis    hands    Diverticulosis    Hx of adenomatous polyp of colon 02/17/2019   Migraines    with menses   Past Surgical History:  Procedure Laterality Date   BREAST BIOPSY Left 04/27/2024   u/s bc ribbon clip path pending   BREAST BIOPSY Left 04/27/2024   US  LT BREAST BX W LOC DEV 1ST LESION IMG BX SPEC US  GUIDE 04/27/2024 [facility]   CESAREAN SECTION     x1   COLONOSCOPY  2010   WISDOM TOOTH EXTRACTION     Patient Active Problem List   Diagnosis Date Noted   Establishing care with new doctor, encounter for 03/05/2024   Fall 03/05/2024   Acute pain of right knee 03/05/2024   Hyperlipidemia 03/05/2024   Finger pain, left 11/19/2022   Hx of adenomatous polyp of colon 02/17/2019   Prediabetes 12/02/2017   Osteoarthrosis, generalized, involving multiple sites 11/14/2015   Obesity (BMI 30-39.9) 11/01/2013   Routine general medical examination at a health care facility 11/01/2010   MENOPAUSAL SYNDROME 07/16/2010   Chronic seasonal allergic rhinitis due to pollen 01/14/2008   Premenstrual tension syndrome 01/13/2008    PCP: Vincente Shivers, NP  REFERRING PROVIDER: Vincente Shivers, NP  REFERRING DIAG:  [phone] (ICD-10-CM) - Acute pain of right knee      RATIONALE FOR EVALUATION AND TREATMENT: Rehabilitation  THERAPY DIAG: Stiffness of right knee, not elsewhere classified  Chronic pain of right  knee  Muscle weakness (generalized)  ONSET DATE: October 10th 2025  FOLLOW-UP APPT SCHEDULED WITH REFERRING PROVIDER: No    SUBJECTIVE:                                                                                                                                                                                         SUBJECTIVE STATEMENT:    Patient reports to OPPT with a chief concern of L knee pain.   PERTINENT HISTORY:  Patient reports a fall on Oct 10th; she reports that she tripped on the last step (going up), her shoes caught the last step and she fell and sprained her ankle. Patient reports that she has used tylenol and voltaren with minimal relief. She reports that's a reclined position relieves the pain however as soon as she transitions from reclined to seated the pain returns. Patient uses a recumbent elliptical in order to transition from the recliner. Patient reports that the pain prevents her from prolonged standing, sitting and she has to navigate stairs one step at a time.   Denies b/b, Saddle parasthesia, chills/fever, night sweats, nausea, vomiting,   Imaging:   EXAM: 4 VIEW(S) XRAY OF THE RIGHT KNEE 03/05/2024 09:58:31 AM   COMPARISON: None available.   CLINICAL HISTORY: acute right knee pain x 4 weeks after fall.   FINDINGS:   BONES AND JOINTS: No acute fracture. No focal osseous lesion. No joint dislocation. There is a small joint effusion. There is lateral patellofemoral compartment joint space narrowing compatible with moderate degenerative change. There are also mild degenerative changes at the lateral compartment of the knee with joint space narrowing and osteophyte formation.   SOFT TISSUES: The soft tissues are unremarkable.   IMPRESSION: 1. Small joint effusion. 2. No acute findings. 3. Moderate degenerative change of the knee.   Electronically signed by: Greig Pique MD 03/09/2024 11:31 PM EST RP Workstation: [id number]  PAIN:    Pain Intensity: Present: 0/10, Best: /10, Worst: 8/10 Pain location: R knee  Pain quality: intermittent and sharp  Radiating pain: Yes  Swelling: Yes  Numbness/Tingling: No Focal weakness or buckling: Yes History of prior back, hip, or knee injury, pain, surgery, or therapy: No  PRECAUTIONS: Fall  WEIGHT BEARING RESTRICTIONS: No  FALLS: Has patient fallen in last 6 months? Yes. Number of falls 1  Living Environment Lives with: lives with their spouse Lives in: House/apartment Stairs: Internal 15 Step; Rail Support: Both   Prior level of function: Independent  Occupational demands: Retired   Hobbies: Taking Care of her dog  PATIENT GOALS: Patient would like to be able to walk without wobbling and get my knee stronger.    OBJECTIVE:   Patient Surveys  LEFS  Extreme difficulty/unable (0), Quite a bit of difficulty (1), Moderate difficulty (2), Little difficulty (3), No difficulty (4) Survey date:  03/15/2024  Any of your usual work, housework or school activities 3  2. Usual hobbies, recreational or sporting activities 2  3. Getting into/out of the bath 0  4. Walking between rooms 4  5. Putting on socks/shoes 3  6. Squatting  1  7. Lifting an object, like a bag of groceries from the floor 3  8. Performing light activities around your home 3  9. Performing heavy activities around your home 1  10. Getting into/out of a car 2  11. Walking 2 blocks 1  12. Walking 1 mile 0  13. Going up/down 10 stairs (1 flight) 3  14. Standing for 1 hour 2  15.  sitting for 1 hour 2  16. Running on even ground 0  17. Running on uneven ground 0  18. Making sharp turns while running fast 0  19. Hopping  0  20. Rolling over in bed 2  Score total:  28/80     Cognition WNL     Gross Musculoskeletal Assessment Tremor: None Bulk: Normal Tone: Normal  GAIT: Distance walked: 10 m Assistive device utilized: None Level of assistance: Complete Independence  Comments: Decreased  weight bearing on R, reciprocal gait pattern   Posture:  AROM AROM (Normal range in degrees) AROM  Hip Right Left  Flexion (125)    Extension (15)    Abduction (40)    Adduction     Internal Rotation (45)    External Rotation (45)        Knee    Flexion (135) 100 130  Extension (0) 0 0      Ankle    Dorsiflexion (20)    Plantarflexion (50)    Inversion (35)    Eversion (15    (* = pain; Blank rows = not tested)  LE MMT: MMT (out of 5) Right Left  Hip flexion 4- 4-  Hip extension    Hip abduction    Hip adduction    Hip internal rotation    Hip external rotation    Knee flexion 4- 4  Knee extension 4- 4  Ankle dorsiflexion 4+ 4+  Ankle plantarflexion 5 5  Ankle inversion    Ankle eversion    (* = pain; Blank rows = not tested)  Sensation Grossly intact to light touch bilateral LEs as determined by testing dermatomes L2-S2. Proprioception, and hot/cold testing deferred on this date.  Reflexes R/L Knee Jerk (L3/4): 2+/2+  Ankle Jerk (S1/2): 2+/2+   Muscle Length Hamstrings: R: Positive for tightness    Palpation Location LEFT  RIGHT           Quadriceps  1  Medial Hamstrings  0  Lateral Hamstrings  0  Lateral Hamstring tendon  0  Medial Hamstring tendon  0  Quadriceps tendon  0  Patella    Patellar Tendon  1  Tibial Tuberosity    Medial joint line  0  Lateral joint line  0  MCL  0  LCL  0  Adductor Tubercle    Pes Anserine tendon    Infrapatellar fat pad    Fibular head    Popliteal fossa    (Blank rows = not tested) Graded on 0-4 scale (0 = no pain, 1 = pain, 2 = pain with wincing/grimacing/flinching, 3 = pain with withdrawal, 4 = unwilling to allow palpation), (Blank rows = not tested)  Passive Accessory Motion Deferred  VASCULAR Deferred    SPECIAL TESTS  Ligamentous Stability  ACL: Lachman's: R: Negative  Anterior Drawer: R: Negative   MCL: Valgus Stress (30 degrees flexion): R: Negative  LCL: Varus Stress (30 degrees  flexion): R: Negative   Functional Tests:  : .75 m/s 5TSTS: 14.07s  TODAY'S TREATMENT: DATE: 05/20/24  Subjective: Patient reports 1/10 NPS in the R knee; contributes the pain and stiffness to the cold. She reports good soreness in the R hip as expected with exercise focused on hip strength.  No further questions or concerns.  Therapeutic Exercises :  OMEGA Cable Machine:   Seated Knee Extension 1 x 10 - 15#  2 x 10 - 20#    Seated Knee Flexion  1 x 10 - 3.5#   2 x 10 - 30#   Supine Bridges on Foam Roller - Legs Extended for improved hamstring focus  2 x 10 reps   Knee Flexion Stretch  1 min - 115 deg   Time Spent reviewing exercise programs and addressed questions from patient.    Therapeutic Activity (with intent to improve functional movements and increase LE strength for walking capacity) :  NuStep - Pace Partner mode - L6-1 x 6 min x  LE Only (Seat 9) SPM > 100 for LE endurance and strength for walking; PT manually adjusted resistance throughout per patient's tolerance.   Lateral Lunge on 1st stair step  R/L: 1 x 10 - SUE on Rail  Resisted TRX Squat - Blue TB around lower leg for increased glute bias  3 x 10   Single Leg RDL - SUE support   R/L: 1 x 10 - AROM   R/L: 1 x 10 - 5# DB  R/L: 1 x 10 - 10# DB  Lateral Stepping against resistance    4 x 12' - Blue TB around ankle   4 x 12' - Blue TB around ankle  4 x 12' - Blue TB around ankle   PATIENT EDUCATION:  Education details: Exercise Technique Person educated: Patient Education method: Explanation, Demonstration, and Handouts Education comprehension: verbalized understanding and returned demonstration   HOME EXERCISE PROGRAM:  STRETCH PROTOCOL  Access Code: [email or web address] URL: [email or web address] Date: 05/20/2024 Prepared by: Lonni Pall  Exercises - Supine Gluteus Stretch  - 2 x daily - 7 x weekly - 3 sets - 30s hold - Supine Piriformis Stretch with Foot on Ground  - 2 x  daily - 7 x weekly - 3 sets - 30s hold - Supine Single Knee to Chest Stretch  - 2 x daily - 7 x weekly - 3 sets - 30s hold - Seated Hamstring Stretch  - 1 x daily - 7 x weekly - 3 sets - 10 reps - Seated Knee Flexion Stretch  - 1 x daily - 7 x weekly - 3 sets - 10 reps - Supine Heel Slide with Strap  - 1 x daily - 7 x weekly - 3 sets - 10 reps  STRENGTH PROTOCOL  Access Code: [email or web address] URL: [email or web address] Date: 05/20/2024 Prepared by: Lonni Pall  Exercises - Recumbent Bike  - 2 x daily - 7 x weekly - 1 sets - 10 min hold - Resisted Sit-to-Stand With Dumbbell at Chest  - 1 x daily - 3-4 x weekly - 2-3 sets - 10-12 reps - Forward Step Up  - 1 x daily - 3-4 x weekly - 2-3 sets - 10 reps - Side Stepping with Resistance at Thighs  - 1 x daily - 3-4 x weekly - 2-3 sets - 10-12 reps - Standing 3-Way Leg Reach with Resistance at Ankles and Unilateral Counter Support  - 1 x daily - 3-4 x weekly - 2-3 sets - 10 reps - Wall Quarter Squat with Foam Roller  - 1 x daily - 3-4 x weekly - 2-3 sets - 10 reps - Goblet Squat with Kettlebell  - 1 x daily - 3-4 x weekly - 2-3 sets - 10 reps - Deadlift With Dumbbells  - 1 x daily - 7 x weekly - 2-3 sets - 8-10 reps - Seated Ankle Eversion with Resistance  - 2 x daily - 3-4 x weekly - 2-3 sets - 10 reps - Toe Raise With Back Against Wall  - 1 x daily - 3-4 x weekly - 2-3 sets - 10 reps - Terminal Knee Extension with Ball and Counter  - 1 x daily - 3-4 x weekly - 2-3 sets - 10-12 reps - Supine Double Leg Bridge on Box  - 1 x daily - 3-4 x weekly - 2-3 sets - 10-12 reps - Single-Leg Romanian Deadlift With Dumbbell  - 1 x daily - 3-4 x weekly - 2-3 sets - 10 reps - 10# dumbbell  - Lateral  Lunge with Foot Turned Out  - 1 x daily - 3-4 x weekly - 2-3 sets - 10 reps  ASSESSMENT:  CLINICAL IMPRESSION: Porchia Sinkler is a 69 y.o. female seen for R knee pain. Pt reached end of POC and is agreeable to discharge to home with HEP. Pt has met  all remaining goals and demonstrates improvements in LE strength, ROM (115 Knee flexion) pain and disability based on MMT, LEFS and NPS (see below at goals.) Time spent reviewing stretch and strength protocol with good return demonstration from patient. The patient has achieved independence with daily activities and is able to perform exercises with proper technique. They no longer require skilled physical therapy interventions and are discharged with a home exercise program to maintain progress. No question at end of session. Follow-up with their primary care provider is recommended if any issues arise.  OBJECTIVE IMPAIRMENTS: Abnormal gait, cardiopulmonary status limiting activity, decreased endurance, difficulty walking, decreased ROM, decreased strength, and pain.   ACTIVITY LIMITATIONS: sitting, standing, squatting, stairs, and transfers  PARTICIPATION LIMITATIONS: community activity and yard work  PERSONAL FACTORS: Age, Past/current experiences, and Time since onset of injury/illness/exacerbation are also affecting patient's functional outcome.   REHAB POTENTIAL: Good  CLINICAL DECISION MAKING: Evolving/moderate complexity  EVALUATION COMPLEXITY: Moderate   GOALS: Goals reviewed with patient? Yes  SHORT TERM GOALS: Target date: 04/12/2024   Pt will be independent with HEP to improve strength and decrease knee pain to improve pain-free function at home and work. Baseline: 03/15/2024: Initial HEP provided;04/19/2024: adherent 100% Goal status: Goal Met     LONG TERM GOALS: Target date: 05/24/2024  Pt will decrease 5TSTS to < 11s seconds in order to demonstrate clinically significant improvement in LE strength  Baseline: 03/15/2024: 14.07s; 04/19/2024: 11.57s; 05/20/2024; 09.88 Goal status: Goal met    2.  Pt will decrease worst knee pain below a 3/10 points on the NPRS in order to demonstrate clinically significant reduction in knee pain. Baseline: 03/15/2024: 8/10 NPS;  04/19/2024: 4-5/10 NPS; 05/10/2024:3/10; 05/20/2024: 3/10 Goal status: Partially Met   3.  Pt will increase LEFS score > 50 points points in order demonstrate clinically significant reduction in knee pain/disability.       Baseline: 03/15/2024: 28/80 (80/80 = no disability); 04/21/2024: 46/80 (80/80 = no disability); 05/21/2023: 61/80 (80/80 no disabilty)  Goal status: Goal met   4.  Pt will increase strength of Knee flexion/ext to a 4+ grade bilaterally in order to demonstrate improvement in strength and function  Baseline: 03/15/2024:  MMT (out of 5) R L 04/19/2024 R/L 05/20/2024 R/L   Knee flexion 4- 4 4/4+ 5/5  Knee extension 4- 4 5/5 5/5   Goal status: Goal met    PLAN: PT FREQUENCY: 1-2x/week  PT DURATION: 8 weeks  PLANNED INTERVENTIONS: Discharge  PLAN FOR NEXT SESSION: Discharge   Lonni Pall PT, DPT Physical Therapist- [place]  05/20/2024, 1:46 PM  "

## 2025-03-07 ENCOUNTER — Encounter: Admitting: General Practice

## 2025-03-25 ENCOUNTER — Ambulatory Visit

## 2025-03-28 ENCOUNTER — Ambulatory Visit

## 2025-03-28 ENCOUNTER — Encounter: Admitting: General Practice
